# Patient Record
Sex: Female | Born: 1945 | Race: White | Hispanic: No | Marital: Married | Smoking: Never smoker
Health system: Southern US, Community
[De-identification: ages and names within clinical notes are randomized; demographics above are authoritative.]

## PROBLEM LIST (undated history)

## (undated) DIAGNOSIS — I10 Essential (primary) hypertension: Secondary | ICD-10-CM

## (undated) DIAGNOSIS — L719 Rosacea, unspecified: Secondary | ICD-10-CM

## (undated) DIAGNOSIS — M8580 Other specified disorders of bone density and structure, unspecified site: Secondary | ICD-10-CM

## (undated) DIAGNOSIS — I73 Raynaud'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: Secondary | ICD-10-CM

## (undated) DIAGNOSIS — F988 Other specified behavioral and emotional disorders with onset usually occurring in childhood and adolescence: Secondary | ICD-10-CM

## (undated) DIAGNOSIS — E559 Vitamin D deficiency, unspecified: Secondary | ICD-10-CM

## (undated) DIAGNOSIS — I8390 Asymptomatic varicose veins of unspecified lower extremity: Secondary | ICD-10-CM

## (undated) DIAGNOSIS — M5134 Other intervertebral disc degeneration, thoracic region: Principal | ICD-10-CM

## (undated) DIAGNOSIS — M19042 Primary osteoarthritis, left hand: Secondary | ICD-10-CM

## (undated) DIAGNOSIS — M19041 Primary osteoarthritis, right hand: Secondary | ICD-10-CM

## (undated) DIAGNOSIS — M40209 Unspecified kyphosis, site unspecified: Secondary | ICD-10-CM

## (undated) HISTORY — DX: Asymptomatic varicose veins of unspecified lower extremity: I83.90

## (undated) HISTORY — DX: Unspecified kyphosis, site unspecified: M40.209

## (undated) HISTORY — DX: Primary osteoarthritis, left hand: M19.042

## (undated) HISTORY — DX: Raynaud'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: I73.00

## (undated) HISTORY — DX: Rosacea, unspecified: L71.9

## (undated) HISTORY — PX: ABDOMINAL HYSTERECTOMY: SHX81

## (undated) HISTORY — PX: CHOLECYSTECTOMY: SHX55

## (undated) HISTORY — DX: Other intervertebral disc degeneration, thoracic region: M51.34

## (undated) HISTORY — PX: TONSILLECTOMY: SUR1361

## (undated) HISTORY — DX: Other specified behavioral and emotional disorders with onset usually occurring in childhood and adolescence: F98.8

## (undated) HISTORY — PX: OTHER SURGICAL HISTORY: SHX169

## (undated) HISTORY — DX: Vitamin D deficiency, unspecified: E55.9

## (undated) HISTORY — DX: Other specified disorders of bone density and structure, unspecified site: M85.80

## (undated) HISTORY — DX: Primary osteoarthritis, right hand: M19.041

---

## 2000-10-11 ENCOUNTER — Ambulatory Visit (HOSPITAL_COMMUNITY): Admission: RE | Admit: 2000-10-11 | Discharge: 2000-10-11 | Payer: Self-pay | Admitting: Internal Medicine

## 2000-10-11 ENCOUNTER — Encounter: Payer: Self-pay | Admitting: Internal Medicine

## 2002-09-23 ENCOUNTER — Encounter: Payer: Self-pay | Admitting: Orthopaedic Surgery

## 2002-09-23 ENCOUNTER — Ambulatory Visit (HOSPITAL_COMMUNITY): Admission: RE | Admit: 2002-09-23 | Discharge: 2002-09-23 | Payer: Self-pay | Admitting: Orthopaedic Surgery

## 2002-10-15 ENCOUNTER — Encounter (HOSPITAL_COMMUNITY): Admission: RE | Admit: 2002-10-15 | Discharge: 2002-11-14 | Payer: Self-pay | Admitting: Orthopaedic Surgery

## 2005-10-04 ENCOUNTER — Ambulatory Visit: Payer: Self-pay | Admitting: Orthopedic Surgery

## 2005-10-29 ENCOUNTER — Ambulatory Visit: Payer: Self-pay | Admitting: Orthopedic Surgery

## 2006-11-04 ENCOUNTER — Ambulatory Visit (HOSPITAL_COMMUNITY): Admission: RE | Admit: 2006-11-04 | Discharge: 2006-11-04 | Payer: Self-pay | Admitting: Obstetrics and Gynecology

## 2006-11-13 ENCOUNTER — Ambulatory Visit (HOSPITAL_COMMUNITY): Admission: RE | Admit: 2006-11-13 | Discharge: 2006-11-13 | Payer: Self-pay | Admitting: Obstetrics and Gynecology

## 2007-05-19 ENCOUNTER — Ambulatory Visit (HOSPITAL_COMMUNITY): Admission: RE | Admit: 2007-05-19 | Discharge: 2007-05-19 | Payer: Self-pay | Admitting: Obstetrics and Gynecology

## 2007-11-06 ENCOUNTER — Ambulatory Visit (HOSPITAL_COMMUNITY): Admission: RE | Admit: 2007-11-06 | Discharge: 2007-11-06 | Payer: Self-pay | Admitting: Obstetrics and Gynecology

## 2007-11-17 ENCOUNTER — Ambulatory Visit (HOSPITAL_COMMUNITY): Admission: RE | Admit: 2007-11-17 | Discharge: 2007-11-17 | Payer: Self-pay | Admitting: Obstetrics and Gynecology

## 2009-01-20 ENCOUNTER — Ambulatory Visit (HOSPITAL_BASED_OUTPATIENT_CLINIC_OR_DEPARTMENT_OTHER): Admission: RE | Admit: 2009-01-20 | Discharge: 2009-01-20 | Payer: Self-pay | Admitting: Orthopedic Surgery

## 2009-02-03 ENCOUNTER — Ambulatory Visit (HOSPITAL_COMMUNITY): Admission: RE | Admit: 2009-02-03 | Discharge: 2009-02-03 | Payer: Self-pay | Admitting: Obstetrics and Gynecology

## 2011-05-02 ENCOUNTER — Other Ambulatory Visit: Payer: Self-pay

## 2011-05-02 DIAGNOSIS — I83893 Varicose veins of bilateral lower extremities with other complications: Secondary | ICD-10-CM

## 2011-05-30 ENCOUNTER — Encounter: Payer: Self-pay | Admitting: Vascular Surgery

## 2011-06-07 ENCOUNTER — Encounter: Payer: Self-pay | Admitting: Vascular Surgery

## 2013-09-09 ENCOUNTER — Ambulatory Visit (HOSPITAL_COMMUNITY): Payer: Self-pay | Admitting: Physical Therapy

## 2013-09-17 ENCOUNTER — Ambulatory Visit (HOSPITAL_COMMUNITY)
Admission: RE | Admit: 2013-09-17 | Discharge: 2013-09-17 | Disposition: A | Discharge status: Home / Self Care | Payer: Medicare Other | Source: Ambulatory Visit | Attending: Rheumatology | Admitting: Rheumatology

## 2013-09-17 DIAGNOSIS — IMO0001 Reserved for inherently not codable concepts without codable children: Secondary | ICD-10-CM | POA: Insufficient documentation

## 2013-09-17 DIAGNOSIS — M6281 Muscle weakness (generalized): Secondary | ICD-10-CM | POA: Diagnosis not present

## 2013-09-17 DIAGNOSIS — M25659 Stiffness of unspecified hip, not elsewhere classified: Secondary | ICD-10-CM

## 2013-09-17 DIAGNOSIS — M818 Other osteoporosis without current pathological fracture: Secondary | ICD-10-CM

## 2013-09-17 DIAGNOSIS — R269 Unspecified abnormalities of gait and mobility: Secondary | ICD-10-CM | POA: Diagnosis not present

## 2013-09-17 DIAGNOSIS — Z13828 Encounter for screening for other musculoskeletal disorder: Secondary | ICD-10-CM

## 2013-09-17 DIAGNOSIS — R262 Difficulty in walking, not elsewhere classified: Secondary | ICD-10-CM

## 2013-09-17 NOTE — Evaluation (Addendum)
Physical Therapy Evaluation  Patient Details  Name: Lauren Knox MRN: [id number] Date of Birth: 03/29/1945  Today's Date: 09/17/2013 Time: 8299-3716 PT Time Calculation (min): 46 min     Charges: 1 Evaluation, TherEx [id number]         Visit#: 1 of 4  Re-eval: 10/17/13 Assessment Diagnosis: Osteoporosis Next MD Visit: Bo Merino, October Prior Therapy: no  Authorization: [employer]     Past Medical History:  Past Medical History  Diagnosis Date  . Varicose veins   . Raynaud's disease   . Osteopenia    Past Surgical History:  Past Surgical History  Procedure Laterality Date  . Abdominal hysterectomy      Subjective Symptoms/Limitations Symptoms: history of pain on Rt side but no recent pain.  Pertinent History: Patient recently had a bone density scan that indicated osteoporosis. bone density scan indicated decreased bone density on Rt vs Lt.  Patient Stated Goals: To increase bone density Pain Assessment Currently in Pain?: No/denies  Cognition/Observation Observation/Other Assessments Observations: Gait: limited hip internal/external rotation, excessive valgus moment, trendelenbereg gait bilaterally..  Other Assessments: Posture: slight forward head, excessive kyphosis, Scoliosis Rt side bent , Lt rotated   Assessment RLE Strength Right Hip Flexion: 5/5 Right Hip Extension: 4/5 Right Hip ABduction: 3+/5 Right Knee Flexion: 4/5 Right Knee Extension: 5/5 Right Ankle Dorsiflexion: 4/5 LLE Strength Left Hip Flexion: 5/5 Left Hip Extension: 4/5 Left Hip ABduction: 3+/5 Left Knee Flexion: 4/5 Left Knee Extension: 5/5 Left Ankle Dorsiflexion: 4/5 Lumbar AROM Overall Lumbar AROM Comments: Scoliosis Rt sidebent Lt rotated Lumbar Strength Lumbar Flexion: 4/5 Lumbar Extension: 4/5  Exercise/Treatments Stretches Piriformis Stretch: Limitations Piriformis Stretch Limitations: Seated 10x 3" Standing Functional Squats: Limitations Functional Squats  Limitations: Squat matrix 5x each (35reps) 3D hip excursions 10x  Physical Therapy Assessment and Plan PT Assessment and Plan Clinical Impression Statement: Patient arrives with referral from doctor for osteoporsis. Patient displasy bilateral LE weakness in gluts and hamstrings, as well as weak abdominal muscles resultign in abnormal posturing of forward head, increased thoracic kyphosis and scoliosis of Rt sidebent and Lt rotated. Patient will benefit from skilled phsyical therapy to educate patient on a strengtheing and stretching program to increase her strength and posture and decrease patient risk of osteoporosis related fractures. Patient was educated this session on performing a regular walking programs and utilizign good dietary habits. Patient stated understanding and that she will be contineuing a regular walking program and continue going to the gym to perform strength training she is educated on in therapy. Patient dmeosntrated good performance and understanding of piriformis stretch and squat matrix noting no pain and the exercises "felt good." Pt will benefit from skilled therapeutic intervention in order to improve on the following deficits: Abnormal gait;Decreased activity tolerance;Decreased balance;Decreased strength;Pain;Impaired flexibility;Improper spinal/pelvic alignment;Improper body mechanics;Increased fascial restricitons Rehab Potential: Good PT Frequency: Min 2X/week PT Duration: 4 weeks PT Treatment/Interventions: Gait training;Stair training;Functional mobility training;Therapeutic activities;Therapeutic exercise;Balance training;Neuromuscular re-education;Patient/family education;Manual techniques PT Plan: Focus of therapy to primarily be on education. Next session introduce LE stretches includign: hip flexor, hamstring, gastroc, and groin all in standing and discharge all stretched to HEP. also initiate UE ground matrix and over head dumbbell matrix. The follwoing session  intorduce lunge matrix and 3D step ups    Goals Home Exercise Program Pt/caregiver will Perform Home Exercise Program: For increased ROM;For increased strengthening PT Goal: Perform Home Exercise Program - Progress: Goal set today PT Short Term Goals Time to Complete Short Term Goals: 2 weeks PT Short  Term Goal 1: Patient will be independent with all stretching exercises for HEP PT Short Term Goal 2: Patient will demonstrate a negative piriformis test indicating improved hip mobility PT Short Term Goal 3: Patient will be independent in exercises to promote decreased scoliotic posture PT Long Term Goals Time to Complete Long Term Goals: 4 weeks PT Long Term Goal 1: Patient will be independent with all strenghtening and stretching exercises for HEP PT Long Term Goal 2: Patient will dmeosntrate hip extension and abduction strength of 4+/5 indicatign improved hip stability Long Term Goal 3: Patient will be able to single leg stand >10 seconds on each leg Long Term Goal 4: Patient will be able to demosntrate 4+/5 abdominal muscle strength and equal timing of muscle contraction with straight leg raise indicating improved trunk stability PT Long Term Goal 5: patienmt will report comfortably walking >105minutes more than 3x a week  Problem List Patient Active Problem List   Diagnosis Date Noted  . 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 Muscle weakness (generalized) 09/17/2013  . Scoliosis concern 09/17/2013  . Stiffness of joint, not elsewhere classified, pelvic region and thigh 09/17/2013  . Difficulty in walking(719.7) 09/17/2013    PT - End of Session Activity Tolerance: Patient tolerated treatment well General Behavior During Therapy: WFL for tasks assessed/performed PT Plan of Care PT Home Exercise Plan: piriformis stretch and squat matrix  GP Functional Assessment Tool Used: FOTO 17% limited Functional Limitation: Mobility: Walking and moving around Mobility: Walking and Moving  Around Current Status (K5993): At least 1 percent but less than 20 percent impaired, limited or restricted Mobility: Walking and Moving Around Goal Status [phone]): 0 percent impaired, limited or restricted Mobility: Walking and Moving Around Discharge Status [phone]): 0 percent impaired, limited or restricted  Shenaya Lebo R 09/17/2013, 10:00 AM  Physician Documentation Your signature is required to indicate approval of the treatment plan as stated above.  Please sign and either send electronically or make a copy of this report for your files and return this physician signed original.   Please mark one 1.__approve of plan  2. ___approve of plan with the following conditions.   ______________________________                                                          _____________________ Physician Signature                                                                                                             Date

## 2013-09-25 ENCOUNTER — Ambulatory Visit (HOSPITAL_COMMUNITY)
Admission: RE | Admit: 2013-09-25 | Discharge: 2013-09-25 | Disposition: A | Discharge status: Home / Self Care | Payer: Medicare Other | Source: Ambulatory Visit | Attending: Internal Medicine | Admitting: Internal Medicine

## 2013-09-25 DIAGNOSIS — IMO0001 Reserved for inherently not codable concepts without codable children: Secondary | ICD-10-CM | POA: Diagnosis not present

## 2013-09-25 NOTE — Progress Notes (Signed)
Physical Therapy Treatment Patient Details  Name: Lauren Knox MRN: [id number] Date of Birth: [birth year]  Today's Date: 09/25/2013 Time: 3646-8032 PT Time Calculation (min): 41 min   Charges: TherEx 1224-8250 Visit#:  2 of   4 Re-eval:   Assessment Diagnosis: Osteoporosis Next MD Visit: Bo Merino, October Prior Therapy: no  Authorization:    Authorization Time Period:    Authorization Visit#:   of     Subjective: Symptoms/Limitations Symptoms: Patient notes sorenessfollwoing performance of HEP specifically with squatting.  Pain Assessment Currently in Pain?: No/denies  Exercise/Treatments Stretches Quad Stretch: Limitations Sports administrator Limitations: 14" retro half kneeling anterior hip drive (rectus femoris stretch) 10x Piriformis Stretch: Limitations Piriformis Stretch Limitations: Seated 10x 3" Standing Functional Squats: Limitations Functional Squats Limitations: Squat matrix 5x each (35reps) with 5lb dumbbell Forward Lunge: 10 reps Side Lunge: 10 reps Other Standing Lumbar Exercises: overhead head dumbell matrix with 3lb dumbbell Other Standing Lumbar Exercises: step ups  Seated Other Seated Lumbar Exercises: 3D thoracic spine excursion  Physical Therapy Assessment and Plan PT Assessment and Plan Clinical Impression Statement: Patients anterior knee pain/soreness attribuuted to limited rectus femoris mobility with pain relived immediately following rectus femoris stretch. Patiend diplayed significant difficulty with performance if lunges attributed to luimited gult strength and difficulty following multi step directions despite verbal, visual and tactile cuing. Patient demonstrated good understand of all exercises at end of session with all exercises discharged to HEP at end of session.  PT Plan: Focus of therapy to primarily be on education. Next session introduce UE ground matrix, tateal and transverse plane step ups,     Goals PT Short Term Goals PT  Short Term Goal 1: Patient will be independent with all stretching exercises for HEP PT Short Term Goal 1 - Progress: Progressing toward goal PT Short Term Goal 2: Patient will demonstrate a negative piriformis test indicating improved hip mobility PT Short Term Goal 2 - Progress: Progressing toward goal PT Short Term Goal 3: Patient will be independent in exercises to promote decreased scoliotic posture PT Short Term Goal 3 - Progress: Progressing toward goal PT Long Term Goals PT Long Term Goal 1: Patient will be independent with all strenghtening and stretching exercises for HEP PT Long Term Goal 1 - Progress: Progressing toward goal PT Long Term Goal 2: Patient will dmeosntrate hip extension and abduction strength of 4+/5 indicatign improved hip stability PT Long Term Goal 2 - Progress: Progressing toward goal Long Term Goal 3: Patient will be able to single leg stand >10 seconds on each leg Long Term Goal 3 Progress: Progressing toward goal Long Term Goal 4: Patient will be able to demosntrate 4+/5 abdominal muscle strength and equal timing of muscle contraction with straight leg raise indicating improved trunk stability Long Term Goal 4 Progress: Progressing toward goal PT Long Term Goal 5: patienmt will report comfortably walking >73minutes more than 3x a week Long Term Goal 5 Progress: Progressing toward goal  Problem List Patient Active Problem List   Diagnosis Date Noted  . 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 Muscle weakness (generalized) 09/17/2013  . Scoliosis concern 09/17/2013  . Stiffness of joint, not elsewhere classified, pelvic region and thigh 09/17/2013  . Difficulty in walking(719.7) 09/17/2013       GP    Cleaven Demario R 09/25/2013, 1:00 PM

## 2013-10-01 ENCOUNTER — Ambulatory Visit (HOSPITAL_COMMUNITY)
Admission: RE | Admit: 2013-10-01 | Discharge: 2013-10-01 | Disposition: A | Discharge status: Home / Self Care | Payer: Medicare Other | Source: Ambulatory Visit | Attending: Internal Medicine | Admitting: Internal Medicine

## 2013-10-01 DIAGNOSIS — IMO0001 Reserved for inherently not codable concepts without codable children: Secondary | ICD-10-CM | POA: Diagnosis not present

## 2013-10-01 NOTE — Progress Notes (Signed)
Physical Therapy Treatment Patient Details  Name: Lauren Knox MRN: [id number] Date of Birth: 1945/07/16  Today's Date: 10/01/2013 Time: 1308-6578 PT Time Calculation (min): 45 min Charge: TE 4696-2952  Visit#: 3 of 5  Re-eval: 10/17/13    Authorization: [employer]  Authorization Time Period:    Authorization Visit#:   of     Subjective: Symptoms/Limitations Symptoms: Pt stated soreness/stiffness following exercises Pain Assessment Currently in Pain?: Yes Pain Score: 3  Pain Location: Knee Pain Orientation: Right;Left  Objective:   Exercise/Treatments Stretches Active Hamstring Stretch: 3 reps;30 seconds;Limitations Active Hamstring Stretch Limitations: 14in box 3 directions Quad Stretch: Limitations Quad Stretch Limitations: 14" retro half kneeling anterior hip drive (rectus femoris stretch) 10x Piriformis Stretch: Limitations Piriformis Stretch Limitations: Seated 10x 3" Standing Other Standing Lumbar Exercises: overhead head dumbell matrix with 3lb dumbbell Other Standing Lumbar Exercises: step ups 6in Bil LE 15x; transverse step up on 8in box 10x Seated Other Seated Lumbar Exercises: 3D thoracic spine excursion Prone  Other Prone Lumbar Exercises: UE ground matrix 10x each UE each direciton    Physical Therapy Assessment and Plan PT Assessment and Plan Clinical Impression Statement: Added lateral and transverse step up for gluteal strengthening with therapist facilitation to improve form.  Began UE ground matrix for core strengthening and to improve thoracic ROM to improve posture.  No reports of pain through session.   PT Plan: Focus of therapy to primarily be on education. Next session address balance activties to improve confidence with functional activities.      Goals PT Short Term Goals PT Short Term Goal 1: Patient will be independent with all stretching exercises for HEP PT Short Term Goal 2: Patient will demonstrate a negative piriformis test indicating  improved hip mobility PT Short Term Goal 3: Patient will be independent in exercises to promote decreased scoliotic posture PT Long Term Goals PT Long Term Goal 1: Patient will be independent with all strenghtening and stretching exercises for HEP PT Long Term Goal 2: Patient will dmeosntrate hip extension and abduction strength of 4+/5 indicatign improved hip stability Long Term Goal 3: Patient will be able to single leg stand >10 seconds on each leg Long Term Goal 4: Patient will be able to demosntrate 4+/5 abdominal muscle strength and equal timing of muscle contraction with straight leg raise indicating improved trunk stability PT Long Term Goal 5: patienmt will report comfortably walking >63minutes more than 3x a week  Problem List Patient Active Problem List   Diagnosis Date Noted  . 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 Muscle weakness (generalized) 09/17/2013  . Scoliosis concern 09/17/2013  . Stiffness of joint, not elsewhere classified, pelvic region and thigh 09/17/2013  . Difficulty in walking(719.7) 09/17/2013    PT - End of Session Activity Tolerance: Patient tolerated treatment well General Behavior During Therapy: [facility] for tasks assessed/performed  GP    Aldona Lento 10/01/2013, 1:02 PM

## 2013-10-08 ENCOUNTER — Ambulatory Visit (HOSPITAL_COMMUNITY)
Admission: RE | Admit: 2013-10-08 | Discharge: 2013-10-08 | Disposition: A | Discharge status: Home / Self Care | Payer: Medicare Other | Source: Ambulatory Visit | Attending: Rheumatology | Admitting: Rheumatology

## 2013-10-08 DIAGNOSIS — IMO0001 Reserved for inherently not codable concepts without codable children: Secondary | ICD-10-CM | POA: Diagnosis not present

## 2013-10-08 NOTE — Progress Notes (Signed)
Physical Therapy Treatment Patient Details  Name: Lauren Knox MRN: [id number] Date of Birth: [birth year]  Today's Date: 10/08/2013 Time: 5027-7412 PT Time Calculation (min): 43 min Visit#: 4 of 5  Re-eval: 10/17/13 Authorization: [employer]  Charges:  therex 42  Subjective: Pt states she has questions regarding some of her HEP (if back knee should be bent or straight with lunges).  States she's been doing her HEP but unsure if she's doing them right.  Currently without pain.     Exercise/Treatments Stretches Active Hamstring Stretch: 3 reps;30 seconds;Limitations Active Hamstring Stretch Limitations: 14in box 3 directions Piriformis Stretch: Limitations Piriformis Stretch Limitations: Seated 10x 3" Standing Functional Squats: Limitations Functional Squats Limitations: Squat matrix 5x each (35reps) with 5lb dumbbell Forward Lunge: 5 reps Side Lunge: 5 reps Push / Pull Sled: single leg matrix with 2" step 10 reps each with 1 HHA Other Standing Lumbar Exercises: overhead head dumbell matrix with 3lb dumbbel(fwd, back close to ears, side, overhead) Other Standing Lumbar Exercises: step ups 6in Bil LE 10X transverse step up without knee hike no UE assist  Physical Therapy Assessment and Plan PT Assessment and Plan Clinical Impression Statement: Pt continues to have difficulty with recall and form of established HEP.  Reviewed all therex with most difficulty coordinating UE/LE movements and keeping body in alignment while completing.  Lead leg with IR of knee with lunges.  Unable to complete transverse step up with knee hike due to instability.  Instructed to do SLS at sink at home; Added single leg matrix using 2" step to increase LE stablity.  No reports of pai at end of session. PT Plan: Focus of therapy to primarily be on education.  continue review of established exercises and progress balance.  Re-evaluate next visit.       Problem List Patient Active Problem List   Diagnosis  Date Noted  . 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 Muscle weakness (generalized) 09/17/2013  . Scoliosis concern 09/17/2013  . Stiffness of joint, not elsewhere classified, pelvic region and thigh 09/17/2013  . Difficulty in walking(719.7) 09/17/2013    PT - End of Session Activity Tolerance: Patient tolerated treatment well General Behavior During Therapy: WFL for tasks assessed/performed   Teena Irani, PTA/CLT 10/08/2013, 3:40 PM

## 2013-10-15 ENCOUNTER — Ambulatory Visit (HOSPITAL_COMMUNITY)
Admission: RE | Admit: 2013-10-15 | Discharge: 2013-10-15 | Disposition: A | Discharge status: Home / Self Care | Payer: Medicare Other | Source: Ambulatory Visit | Attending: Internal Medicine | Admitting: Internal Medicine

## 2013-10-15 DIAGNOSIS — M6281 Muscle weakness (generalized): Secondary | ICD-10-CM | POA: Diagnosis not present

## 2013-10-15 DIAGNOSIS — Z5189 Encounter for other specified aftercare: Secondary | ICD-10-CM | POA: Diagnosis not present

## 2013-10-15 DIAGNOSIS — R269 Unspecified abnormalities of gait and mobility: Secondary | ICD-10-CM | POA: Insufficient documentation

## 2013-10-15 DIAGNOSIS — M810 Age-related osteoporosis without current pathological fracture: Secondary | ICD-10-CM | POA: Diagnosis not present

## 2013-10-15 NOTE — Evaluation (Signed)
Physical Therapy Evaluation  Patient Details  Name: Lauren Knox MRN: [id number] Date of Birth: 20-Jun-1945  Today's Date: 10/15/2013 Time: 3976-7341 PT Time Calculation (min): 45 min     Charges: TE 845-930         Visit#: 5 of 5  Re-eval: 10/17/13 Assessment Diagnosis: Osteoporosis Next MD Visit: Bo Merino, October 6th Prior Therapy: no  Authorization: [employer]    Authorization Time Period:    Authorization Visit#:   of     Past Medical History:  Past Medical History  Diagnosis Date  . Varicose veins   . Raynaud's disease   . Osteopenia    Past Surgical History:  Past Surgical History  Procedure Laterality Date  . Abdominal hysterectomy      Subjective Symptoms/Limitations Symptoms: Patient states she feels her balance is still off a little, but is feeling better.  Pain Assessment Currently in Pain?: No/denies  Cognition/Observation Observation/Other Assessments Observations: Gait WNL Other Assessments: Posture: Minor fiorward head posture  Assessment RLE Strength Right Hip Flexion: 5/5 Right Hip Extension: 4/5 Right Hip ABduction: 4/5 Right Knee Flexion: 4/5 Right Knee Extension: 5/5 Right Ankle Dorsiflexion: 5/5 LLE Strength Left Hip Flexion: 5/5 Left Hip Extension: 4/5 Left Hip ABduction: 4/5 Left Knee Flexion: 4/5 Left Knee Extension: 5/5 Left Ankle Dorsiflexion: 5/5 Lumbar AROM Overall Lumbar AROM Comments: Scoliosis Rt sidebent Lt rotated Lumbar Strength Lumbar Flexion: 5/5 Lumbar Extension: 5/5  Exercise/Treatments Standing Functional Squats: Limitations Functional Squats Limitations: Squat reach matrix 5x each (35reps) with 5lb dumbbell Forward Lunge: Limitations Forward Lunge Limitations: lunge matrix common 5x each Other Standing Lumbar Exercises: 2" Single leg balance reach matrix 5x, Below shoulder height UE dumbbell matrix 3lb 5x, overhead head dumbell matrix with 5lb dumbbel(fwd, back close to ears, side, overhead) 5x  each Other Standing Lumbar Exercises: 8" 3D step up 10x Prone  Other Prone Lumbar Exercises: UE and LE ground matrix 5x each  Physical Therapy Assessment and Plan PT Assessment and Plan Clinical Impression Statement: Patient has met most goals and is expected to progress independently to reach remainign goals withtou difficulty as long as she performs HEP as instructed. Patient displays improved trunk alignment, improved gait mechics and improved balance. patient has no pain and is independent with HEP.  This session focused on further education of exercises to be performed as part of HEP and and progressiong of exercises to increase intensity and further icnrease strength independently.  PT Plan: Patient Discharged with HEP.    Goals PT Short Term Goals PT Short Term Goal 1: Patient will be independent with all stretching exercises for HEP PT Short Term Goal 1 - Progress: Met PT Short Term Goal 2: Patient will demonstrate a negative piriformis test indicating improved hip mobility PT Short Term Goal 2 - Progress: Met PT Short Term Goal 3: Patient will be independent in exercises to promote decreased scoliotic posture PT Short Term Goal 3 - Progress: Met PT Long Term Goals PT Long Term Goal 1: Patient will be independent with all strenghtening and stretching exercises for HEP PT Long Term Goal 1 - Progress: Met PT Long Term Goal 2: Patient will dmeosntrate hip extension and abduction strength of 4+/5 indicatign improved hip stability PT Long Term Goal 2 - Progress: Progressing toward goal Long Term Goal 3: Patient will be able to single leg stand >10 seconds on each leg Long Term Goal 3 Progress: Met Long Term Goal 4: Patient will be able to demosntrate 4+/5 abdominal muscle strength and equal timing  of muscle contraction with straight leg raise indicating improved trunk stability Long Term Goal 4 Progress: Met PT Long Term Goal 5: patienmt will report comfortably walking >30mnutes more  than 3x a week Long Term Goal 5 Progress: Met  Problem List Patient Active Problem List   Diagnosis Date Noted  . 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 Muscle weakness (generalized) 09/17/2013  . Scoliosis concern 09/17/2013  . Stiffness of joint, not elsewhere classified, pelvic region and thigh 09/17/2013  . Difficulty in walking(719.7) 09/17/2013    PT - End of Session Activity Tolerance: Patient tolerated treatment well General Behavior During Therapy: WFL for tasks assessed/performed PT Plan of Care PT Home Exercise Plan: Given.   GP Functional Assessment Tool Used: Clinical judgement 0% limited Functional Limitation: Mobility: Walking and moving around Mobility: Walking and Moving Around Current Status ([phone]: 0 percent impaired, limited or restricted Mobility: Walking and Moving Around Goal Status ([phone]: 0 percent impaired, limited or restricted Mobility: Walking and Moving Around Discharge Status ([phone]: 0 percent impaired, limited or restricted  Annalina Needles R 10/15/2013, 9:54 AM  Physician Documentation Your signature is required to indicate approval of the treatment plan as stated above.  Please sign and either send electronically or make a copy of this report for your files and return this physician signed original.   Please mark one 1.__approve of plan  2. ___approve of plan with the following conditions.   ______________________________                                                          _____________________ Physician Signature                                                                                                             Date

## 2013-10-15 NOTE — Addendum Note (Signed)
Encounter addended by: Leia Alf, PT on: 10/15/2013 10:02 AM<BR>     Documentation filed: Clinical Notes

## 2015-04-01 DIAGNOSIS — Z79899 Other long term (current) drug therapy: Secondary | ICD-10-CM | POA: Diagnosis not present

## 2015-04-01 DIAGNOSIS — D580 Hereditary spherocytosis: Secondary | ICD-10-CM | POA: Diagnosis not present

## 2015-04-01 DIAGNOSIS — E119 Type 2 diabetes mellitus without complications: Secondary | ICD-10-CM | POA: Diagnosis not present

## 2015-04-01 DIAGNOSIS — M810 Age-related osteoporosis without current pathological fracture: Secondary | ICD-10-CM | POA: Diagnosis not present

## 2015-04-12 DIAGNOSIS — D580 Hereditary spherocytosis: Secondary | ICD-10-CM | POA: Diagnosis not present

## 2015-04-12 DIAGNOSIS — Z6824 Body mass index (BMI) 24.0-24.9, adult: Secondary | ICD-10-CM | POA: Diagnosis not present

## 2015-04-12 DIAGNOSIS — E1129 Type 2 diabetes mellitus with other diabetic kidney complication: Secondary | ICD-10-CM | POA: Diagnosis not present

## 2015-04-12 DIAGNOSIS — E559 Vitamin D deficiency, unspecified: Secondary | ICD-10-CM | POA: Diagnosis not present

## 2015-05-11 DIAGNOSIS — Z6823 Body mass index (BMI) 23.0-23.9, adult: Secondary | ICD-10-CM | POA: Diagnosis not present

## 2015-05-11 DIAGNOSIS — Z01419 Encounter for gynecological examination (general) (routine) without abnormal findings: Secondary | ICD-10-CM | POA: Diagnosis not present

## 2015-05-11 DIAGNOSIS — M816 Localized osteoporosis [Lequesne]: Secondary | ICD-10-CM | POA: Diagnosis not present

## 2015-05-11 DIAGNOSIS — Z1231 Encounter for screening mammogram for malignant neoplasm of breast: Secondary | ICD-10-CM | POA: Diagnosis not present

## 2015-06-03 DIAGNOSIS — E119 Type 2 diabetes mellitus without complications: Secondary | ICD-10-CM | POA: Diagnosis not present

## 2015-06-03 DIAGNOSIS — H2513 Age-related nuclear cataract, bilateral: Secondary | ICD-10-CM | POA: Diagnosis not present

## 2015-06-03 DIAGNOSIS — H40013 Open angle with borderline findings, low risk, bilateral: Secondary | ICD-10-CM | POA: Diagnosis not present

## 2015-06-03 DIAGNOSIS — H10413 Chronic giant papillary conjunctivitis, bilateral: Secondary | ICD-10-CM | POA: Diagnosis not present

## 2015-06-21 DIAGNOSIS — M19241 Secondary osteoarthritis, right hand: Secondary | ICD-10-CM | POA: Diagnosis not present

## 2015-06-21 DIAGNOSIS — M810 Age-related osteoporosis without current pathological fracture: Secondary | ICD-10-CM | POA: Diagnosis not present

## 2015-06-21 DIAGNOSIS — M791 Myalgia: Secondary | ICD-10-CM | POA: Diagnosis not present

## 2015-06-21 LAB — BASIC METABOLIC PANEL
Potassium: 4.1 mmol/L (ref 3.4–5.3)
Sodium: 141 mmol/L (ref 137–147)

## 2015-06-24 DIAGNOSIS — E559 Vitamin D deficiency, unspecified: Secondary | ICD-10-CM | POA: Diagnosis not present

## 2015-06-24 DIAGNOSIS — R5381 Other malaise: Secondary | ICD-10-CM | POA: Diagnosis not present

## 2015-06-25 LAB — VITAMIN D 25 HYDROXY (VIT D DEFICIENCY, FRACTURES): VIT D 25 HYDROXY: 33

## 2015-10-25 DIAGNOSIS — Z1211 Encounter for screening for malignant neoplasm of colon: Secondary | ICD-10-CM | POA: Diagnosis not present

## 2015-10-25 DIAGNOSIS — Z800 Family history of malignant neoplasm of digestive organs: Secondary | ICD-10-CM | POA: Diagnosis not present

## 2015-10-25 DIAGNOSIS — Z8371 Family history of colonic polyps: Secondary | ICD-10-CM | POA: Diagnosis not present

## 2015-10-25 DIAGNOSIS — K5730 Diverticulosis of large intestine without perforation or abscess without bleeding: Secondary | ICD-10-CM | POA: Diagnosis not present

## 2015-10-25 DIAGNOSIS — Z86010 Personal history of colonic polyps: Secondary | ICD-10-CM | POA: Diagnosis not present

## 2015-11-21 DIAGNOSIS — D225 Melanocytic nevi of trunk: Secondary | ICD-10-CM | POA: Diagnosis not present

## 2015-11-21 DIAGNOSIS — L700 Acne vulgaris: Secondary | ICD-10-CM | POA: Diagnosis not present

## 2015-11-21 DIAGNOSIS — L570 Actinic keratosis: Secondary | ICD-10-CM | POA: Diagnosis not present

## 2015-11-21 DIAGNOSIS — Z23 Encounter for immunization: Secondary | ICD-10-CM | POA: Diagnosis not present

## 2015-11-21 DIAGNOSIS — L719 Rosacea, unspecified: Secondary | ICD-10-CM | POA: Diagnosis not present

## 2015-11-21 DIAGNOSIS — Z85828 Personal history of other malignant neoplasm of skin: Secondary | ICD-10-CM | POA: Diagnosis not present

## 2015-11-21 DIAGNOSIS — D1801 Hemangioma of skin and subcutaneous tissue: Secondary | ICD-10-CM | POA: Diagnosis not present

## 2015-11-21 DIAGNOSIS — L814 Other melanin hyperpigmentation: Secondary | ICD-10-CM | POA: Diagnosis not present

## 2015-11-21 DIAGNOSIS — L821 Other seborrheic keratosis: Secondary | ICD-10-CM | POA: Diagnosis not present

## 2015-11-22 DIAGNOSIS — Z23 Encounter for immunization: Secondary | ICD-10-CM | POA: Diagnosis not present

## 2015-12-20 ENCOUNTER — Ambulatory Visit: Payer: Self-pay | Admitting: Rheumatology

## 2015-12-21 DIAGNOSIS — R69 Illness, unspecified: Secondary | ICD-10-CM | POA: Diagnosis not present

## 2015-12-21 DIAGNOSIS — F341 Dysthymic disorder: Secondary | ICD-10-CM | POA: Diagnosis not present

## 2016-01-17 ENCOUNTER — Ambulatory Visit: Payer: Self-pay | Admitting: Rheumatology

## 2016-01-20 DIAGNOSIS — Z1211 Encounter for screening for malignant neoplasm of colon: Secondary | ICD-10-CM | POA: Diagnosis not present

## 2016-01-20 DIAGNOSIS — K5730 Diverticulosis of large intestine without perforation or abscess without bleeding: Secondary | ICD-10-CM | POA: Diagnosis not present

## 2016-01-20 DIAGNOSIS — Z800 Family history of malignant neoplasm of digestive organs: Secondary | ICD-10-CM | POA: Diagnosis not present

## 2016-01-20 DIAGNOSIS — Z86010 Personal history of colonic polyps: Secondary | ICD-10-CM | POA: Diagnosis not present

## 2016-01-25 ENCOUNTER — Telehealth (INDEPENDENT_AMBULATORY_CARE_PROVIDER_SITE_OTHER): Payer: Self-pay | Admitting: Rheumatology

## 2016-01-25 NOTE — Telephone Encounter (Signed)
Last Visit: 06/21/15 Next Visit: 01/30/16  Patient requesting a prescription for a different muscle relaxer. Patient states her insurance company is requesting she be given something other than Tizanidine.

## 2016-01-25 NOTE — Telephone Encounter (Signed)
Patient requesting Rx muscle relaxer for L shoulder.  She is using a heating pad.  She was written a Rx for Tizanidine but her Insurance Co [employer]) is requesting that she have another brand.    Patient uses [facility]

## 2016-01-25 NOTE — Telephone Encounter (Signed)
It is not appropriate to change the muscle relaxer unless they have medical evidence that Zanaflex is inappropriate for the patient.Without insurance, patient can use good [email or web address] and get the medication for the following price:   $21.45 AT [employer].  For 90 pills [email or web address]

## 2016-01-26 ENCOUNTER — Telehealth (INDEPENDENT_AMBULATORY_CARE_PROVIDER_SITE_OTHER): Payer: Self-pay | Admitting: Rheumatology

## 2016-01-26 MED ORDER — TIZANIDINE HCL 4 MG PO TABS: 4.0000 mg | ORAL_TABLET | Freq: Four times a day (QID) | ORAL | 1 refills | Status: DC | PRN

## 2016-01-26 NOTE — Telephone Encounter (Signed)
Patient has been advised. Patient will use [email or web address] and get th prescription from [facility] in [place]. Patient needs a refill on the Tizanidine.    Last Visit: 06/21/15 Next Visit: 01/30/16  Okay to refill Tizanidine?

## 2016-01-26 NOTE — Telephone Encounter (Signed)
Patient prefers the [facility] in [place] because she does not live in [place]. Good Rx coupon will make it $23.

## 2016-01-26 NOTE — Telephone Encounter (Signed)
Patient says she requested a medication (Tizanidine ? ) this morning. She thought her medication was being sent to [facility] (confirmed per chart) , [facility] says they have not received the request and she wants to know why.  Cb#: [phone]

## 2016-01-26 NOTE — Telephone Encounter (Signed)
Okay to refill Zanaflex 4 mg1 by mouth daily at bedtime.90 pills with 1 refill. Use good Rx coupon at [facility] to get this medicine for less than $22 without insurance

## 2016-01-26 NOTE — Telephone Encounter (Signed)
Left message for patient to advise prescription has been sent to the pharmacy.   

## 2016-01-27 ENCOUNTER — Encounter: Payer: Self-pay | Admitting: Rheumatology

## 2016-01-27 DIAGNOSIS — F988 Other specified behavioral and emotional disorders with onset usually occurring in childhood and adolescence: Secondary | ICD-10-CM | POA: Insufficient documentation

## 2016-01-27 DIAGNOSIS — M40209 Unspecified kyphosis, site unspecified: Secondary | ICD-10-CM

## 2016-01-27 DIAGNOSIS — M5134 Other intervertebral disc degeneration, thoracic region: Secondary | ICD-10-CM

## 2016-01-27 DIAGNOSIS — M19041 Primary osteoarthritis, right hand: Secondary | ICD-10-CM

## 2016-01-27 DIAGNOSIS — I73 Raynaud'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: Secondary | ICD-10-CM

## 2016-01-27 DIAGNOSIS — E559 Vitamin D deficiency, unspecified: Secondary | ICD-10-CM

## 2016-01-27 DIAGNOSIS — L719 Rosacea, unspecified: Secondary | ICD-10-CM

## 2016-01-27 DIAGNOSIS — M19042 Primary osteoarthritis, left hand: Secondary | ICD-10-CM

## 2016-01-27 HISTORY — DX: Other intervertebral disc degeneration, thoracic region: M51.34

## 2016-01-27 HISTORY — DX: Rosacea, unspecified: L71.9

## 2016-01-27 HISTORY — DX: Raynaud'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: I73.00

## 2016-01-27 HISTORY — DX: Primary osteoarthritis, left hand: M19.041

## 2016-01-27 HISTORY — DX: Vitamin D deficiency, unspecified: E55.9

## 2016-01-27 HISTORY — DX: Primary osteoarthritis, right hand: M19.042

## 2016-01-27 HISTORY — DX: Other specified behavioral and emotional disorders with onset usually occurring in childhood and adolescence: F98.8

## 2016-01-27 HISTORY — DX: Unspecified kyphosis, site unspecified: M40.209

## 2016-01-27 NOTE — Progress Notes (Signed)
Office Visit Note  Patient: Lauren Knox             Date of Birth: 05-25-1945           MRN: [id number]             PCP: Asencion Noble, MD Referring: Asencion Noble, MD Visit Date: 01/30/2016 Occupation: @GUAROCC @    Subjective:  Left shoulder pain   History of Present Illness: JEZLYN BEUCLER is a 71 y.o. female with history of osteoporosis, disc disease and osteoarthritis. She states in December 2017 she started having right-sided rib cage pain which resolved by itself. Although the pain was quite severe when it happened. She developed left shoulder joint pain and arm pain every week back after lifting a heavy object and the pain is gradually getting better. She also complains of Raynaud's phenomenon minutes cold-weather. She also wanted to discuss options for treatment of osteoporosis as Fosamax has not helped her.  Activities of Daily Living:  Patient reports morning stiffness for 0 minute.   Patient Denies nocturnal pain.  Difficulty dressing/grooming: Denies Difficulty climbing stairs: Denies Difficulty getting out of chair: Denies Difficulty using hands for taps, buttons, cutlery, and/or writing: Denies   Review of Systems  Constitutional: Negative for fatigue, night sweats, weight gain, weight loss and weakness.  HENT: Negative for mouth sores, trouble swallowing, trouble swallowing, mouth dryness and nose dryness.   Eyes: Negative for pain, redness, visual disturbance and dryness.  Respiratory: Negative for cough, shortness of breath and difficulty breathing.   Cardiovascular: Negative for chest pain, palpitations, hypertension, irregular heartbeat and swelling in legs/feet.  Gastrointestinal: Negative for blood in stool, constipation and diarrhea.  Endocrine: Negative for increased urination.  Genitourinary: Negative for vaginal dryness.  Musculoskeletal: Negative for arthralgias, joint pain, joint swelling, myalgias, muscle weakness, morning stiffness, muscle  tenderness and myalgias.  Skin: Negative for color change, rash, hair loss, skin tightness, ulcers and sensitivity to sunlight.  Allergic/Immunologic: Negative for susceptible to infections.  Neurological: Negative for dizziness, memory loss and night sweats.  Hematological: Negative for swollen glands.  Psychiatric/Behavioral: Negative for depressed mood and sleep disturbance. The patient is not nervous/anxious.     PMFS History:  Patient Active Problem List   Diagnosis Date Noted  . 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 ADD (attention deficit disorder) 01/27/2016  . Rosacea 01/27/2016  . Vitamin D deficiency 01/27/2016  . 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 Muscle weakness (generalized) 09/17/2013  . Scoliosis concern 09/17/2013  . Stiffness of joint, not elsewhere classified, pelvic region and thigh 09/17/2013  . Difficulty in walking(719.7) 09/17/2013    Past Medical History:  Diagnosis Date  . ADD (attention deficit disorder) 01/27/2016  . 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 Osteopenia   . Raynaud's disease   . 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 Rosacea 01/27/2016  . Varicose veins   . Vitamin D deficiency 01/27/2016    Family History  Problem Relation Age of Onset  . Cancer Mother   . Cancer Father   . Heart attack Father    Past Surgical History:  Procedure Laterality Date  . ABDOMINAL HYSTERECTOMY     Social History   Social History Narrative  . No narrative on file     Objective: Vital Signs: BP (!) 173/83 (BP  Location: Left Arm, Patient Position: Sitting, Cuff Size: Large)   Pulse (!) 59   Resp 12   Ht 5' 4.25" (1.632 m)   Wt 148 lb (67.1 kg)   BMI 25.21 kg/m    Physical Exam  Constitutional: She is  oriented to person, place, and time. She appears well-developed and well-nourished.  HENT:  Head: Normocephalic and atraumatic.  Eyes: Conjunctivae and EOM are normal.  Neck: Normal range of motion.  Cardiovascular: Normal rate, regular rhythm, normal heart sounds and intact distal pulses.   Pulmonary/Chest: Effort normal and breath sounds normal.  Abdominal: Soft. Bowel sounds are normal.  Lymphadenopathy:    She has no cervical adenopathy.  Neurological: She is alert and oriented to person, place, and time.  Skin: Skin is warm and dry. Capillary refill takes less than 2 seconds.  Psychiatric: She has a normal mood and affect. Her behavior is normal.  Nursing note and vitals reviewed.    Musculoskeletal Exam: C-spine and thoracic lumbar spine good range of motion. Hip joints elbow joints wrist joint MCPs PIPs were good range of motion she has some thickening of PIP/DIP joints consistent with osteoarthritis. Hip joints knee joints ankles MTPs PIPs with good range of motion with no synovitis. She has some discomfort with range of motion of her left shoulder joint.  CDAI Exam: No CDAI exam completed.    Investigation: No additional findings.   Imaging: No results found.  Speciality Comments: No specialty comments available.    Procedures:  No procedures performed Allergies: Codeine   Assessment / Plan:     Visit Diagnoses: Age-related osteoporosis without current pathological fracture - On Fosamax: Her most recent bone density from 05/11/2015 showed a T score of -2.5 in the femoral neck region which is down by -5.3% from the 2015. We had detailed discussion regarding that. The changes significant. We discussed possible option of using IV Reclast area indications side effects contraindications were discussed at length by me and Dr. Koleen Nimrod of her pharmacist. Patient wants to proceed with IV Reclast. She'll be getting CBC and comprehensive metabolic panel done with her PCP. Begin  schedule IV Reclast after that.  Postural kyphosis of thoracolumbar region: Her kyphosis is improved a lot after physical therapy   Vitamin D deficiency: She has history of vitamin D deficiency she's been on supplements. She'll get her vitamin D levels with her PCP as well.  Acute pain of left shoulder: Recent discomfort in her left shoulder which is gradually improving most likely tendinopathy. I offered physical therapy she declined. She states she has some exercises at home which she will try.  DDD (degenerative disc disease), thoracic: It's been causing chronic pain have advised her to continue to exercise.  Primary osteoarthritis of both hands: Joint protection and muscle strengthening was discussed.   Her other medical problems are as follows for which she sees her PCP and other physicians: Attention deficit disorder, unspecified hyperactivity presence  Rosacea    Orders: No orders of the defined types were placed in this encounter.  No orders of the defined types were placed in this encounter.   Face-to-face time spent with patient was 40 minutes. 50% of time was spent in counseling and coordination of care.  Follow-Up Instructions: Return in about 6 months (around 07/29/2016) for OSTEOPROSIS, fosamax failure, , Osteoporosis.   Bo Merino, MD  Note - This record has been created using Editor, commissioning.  Chart creation errors have been sought, but may not always  have been  located. Such creation errors do not reflect on  the standard of medical care.

## 2016-01-30 ENCOUNTER — Ambulatory Visit (INDEPENDENT_AMBULATORY_CARE_PROVIDER_SITE_OTHER): Payer: Medicare HMO | Admitting: Rheumatology

## 2016-01-30 ENCOUNTER — Encounter: Payer: Self-pay | Admitting: Rheumatology

## 2016-01-30 VITALS — BP 173/83 | HR 59 | Resp 12 | Ht 64.25 in | Wt 148.0 lb

## 2016-01-30 DIAGNOSIS — M810 Age-related osteoporosis without current pathological fracture: Secondary | ICD-10-CM | POA: Diagnosis not present

## 2016-01-30 DIAGNOSIS — M5134 Other intervertebral disc degeneration, thoracic region: Secondary | ICD-10-CM

## 2016-01-30 DIAGNOSIS — M25512 Pain in left shoulder: Secondary | ICD-10-CM | POA: Diagnosis not present

## 2016-01-30 DIAGNOSIS — F988 Other specified behavioral and emotional disorders with onset usually occurring in childhood and adolescence: Secondary | ICD-10-CM

## 2016-01-30 DIAGNOSIS — M19041 Primary osteoarthritis, right hand: Secondary | ICD-10-CM | POA: Diagnosis not present

## 2016-01-30 DIAGNOSIS — M19042 Primary osteoarthritis, left hand: Secondary | ICD-10-CM

## 2016-01-30 DIAGNOSIS — M4005 Postural kyphosis, thoracolumbar region: Secondary | ICD-10-CM

## 2016-01-30 DIAGNOSIS — L719 Rosacea, unspecified: Secondary | ICD-10-CM

## 2016-01-30 DIAGNOSIS — M6281 Muscle weakness (generalized): Secondary | ICD-10-CM | POA: Diagnosis not present

## 2016-01-30 DIAGNOSIS — E559 Vitamin D deficiency, unspecified: Secondary | ICD-10-CM

## 2016-01-30 DIAGNOSIS — R69 Illness, unspecified: Secondary | ICD-10-CM | POA: Diagnosis not present

## 2016-01-30 NOTE — Progress Notes (Signed)
Pharmacy Note  Subjective: Patient presents today to the [facility] to see Dr. Estanislado Pandy.  Patient seen by pharmacist for counseling on bisphosphonate therapy.  Patient is currently on alendronate and is being switched to Reclast infusions.    Objective: T-score: -2.5 (05/11/15) Calcium: 9.4 mg/dL  (04/01/15) Vitamin D: 33 (06/24/15)  Assessment/Plan: Counseled patient that zoledronic acid (Reclast) is an oral bisphosphonate that reduces bone turnover by inhibiting osteoclasts that chew up bone.  Counseled patient on purpose, proper use, and adverse effects of zoledronic acid.  Reviewed with patient that zoledronic acid is a yearly infusion.  Reviewed importance of taking calcium and vitamin D with bisphosphonate therapy.  Patient confirms she is already taking calcium/vitamin D.  Provided patient with medication education material and answered all questions.  Reviewed rare adverse effect of osteonecrosis of the jaw and advised patient to alert her dentist that she is on bisphosphonate therapy prior to any major dental work.  Patient confirms she does not have any major dental work scheduled at this time.  Will apply for Reclast through patient's insurance.  Patient is aware that she will need an updated CMP within 10 days before her infusion.  Patient wanted to get her labs when she has her physical with her primary care provider next month.  Patient was provided with lab order for CMP and vitamin D to take to her primary care office.     Elisabeth Most, Pharm.D., BCPS Clinical Pharmacist Pager: [phone] Phone: [phone] 01/30/2016 2:05 PM

## 2016-01-31 ENCOUNTER — Telehealth: Payer: Self-pay

## 2016-01-31 NOTE — Telephone Encounter (Signed)
Spoke with [name] from [place] to verify that no pre-certification will be required for reclast infusion [id number] for Lauren Knox.  Reference [id number] with Marin Comment from [place] to verify that [name] is covered 100% for her infusion services. She will not have a copay or out of pocket expense.   Reference# [id number]  Demetrios Loll, CPhT

## 2016-01-31 NOTE — Telephone Encounter (Signed)
Called patient and informed her that per the [employer], Reclast is covered by her insurance.  Patient is aware that she must have lab work within 10 days prior to infusion.  Patient still wants to wait and have the lab work done at her annual physican with Dr. Willey Blade in February.  Advised patient to call us if she decides she wants to have labs done prior to that visit.  Patient is aware that labs from Dr. Ria Comment office will need to be faxed to [place] and advised her to call as well to ensure we get the results and have time to put in the order and schedule her Reclast in the 10 day window after labs.  Patient voiced understanding.     Elisabeth Most, Pharm.D., BCPS, CPP Clinical Pharmacist Pager: [phone] Phone: [phone] 01/31/2016 3:51 PM

## 2016-04-03 ENCOUNTER — Telehealth: Payer: Self-pay | Admitting: Rheumatology

## 2016-04-03 NOTE — Telephone Encounter (Signed)
I spoke to patient who wanted to know the name of the infusion medication we discussed at her last appointment.  I reviewed that the medication was Reclast.  We had a detailed discussion about Reclast and her concenrs.  Patient had concerns about jaw problems with the medication.  Reviewed the risk of osteonecrosis of the jaw which is mostly assocaited after major dental work.  Patient confirms she does not have any major dental work scheduled.    Patient confirms she is getting her labs from her PCP on Thursday, 04/05/16.  I asked her to send [place] those as soon as she can.  Advised patient we must have recent labs 10 days before scheduling her infusion.  Patient voiced understanding.     Elisabeth Most, Pharm.D., BCPS, CPP Clinical Pharmacist Pager: [phone] Phone: [phone] 04/03/2016 3:01 PM

## 2016-04-03 NOTE — Telephone Encounter (Signed)
Patient has questions for Dr. Koleen Nimrod about an infusion she is supposed to start. Patient is unsure of the name of the infusion. Please call patient back at [phone].

## 2016-04-04 ENCOUNTER — Telehealth: Payer: Self-pay | Admitting: Rheumatology

## 2016-04-04 NOTE — Telephone Encounter (Signed)
I spoke to patient.  I informed her that we did a benefits investigation in January and no pre-certification was required.  Patient reports she was told yesterday that she does need a prior authorization.   Chasta, can you call [employer] to clarify? Thanks!

## 2016-04-04 NOTE — Telephone Encounter (Signed)
Patient has questions for Dr. Koleen Nimrod about the reclast infusion and the pre approval process. She said she spoke with Dr.Henderson yesterday but did not know about the pre approval process. Please call patient.

## 2016-04-04 NOTE — Telephone Encounter (Signed)
[employer] and spoke with Sophia who states that Ms. Levene's infusion for reclast will not require a pre-certification.   Reference number: [id number] Phone number: [phone]  Spoke with Ms. Grisanti to inform her and she voiced understanding. She denies any other questions about her medication at this time.   Charda Janis, [place], CPhT 2:10 PM

## 2016-04-05 DIAGNOSIS — Z79899 Other long term (current) drug therapy: Secondary | ICD-10-CM | POA: Diagnosis not present

## 2016-04-05 DIAGNOSIS — E119 Type 2 diabetes mellitus without complications: Secondary | ICD-10-CM | POA: Diagnosis not present

## 2016-04-05 DIAGNOSIS — D580 Hereditary spherocytosis: Secondary | ICD-10-CM | POA: Diagnosis not present

## 2016-04-10 ENCOUNTER — Telehealth: Payer: Self-pay | Admitting: Pharmacist

## 2016-04-10 NOTE — Telephone Encounter (Signed)
Patient had labs 04/05/16 and was supposed to send labs over in order to schedule Reclast infusion.  I have been on the lookout for her labs but we have not received them.  I spoke to patient who confirmed she had labs drawn and requested them to send [place] the results yesterday.  The PCP office was supposed to call her when they sent the results, but patient has not heard anything yet.  Patient plans to follow up with PCP office today if she has not heard anything.  I advised her to call us once the labs are sent so we can look for them.    Elisabeth Most, Pharm.D., BCPS, CPP Clinical Pharmacist Pager: [phone] Phone: [phone] 04/10/2016 8:40 AM

## 2016-04-11 NOTE — Telephone Encounter (Signed)
Patient left message on voice mail (yesterday) requesting to speak with the Pharmacist.  She states she went tot the dentist yesterday and now has questions about the infusion.

## 2016-04-11 NOTE — Telephone Encounter (Signed)
I spoke to patient.  She said Dr. Ria Comment office sent the labs.  Informed her we still have not received those.  I tried to call Dr. Ria Comment office to request them.  Their office closed at noon today.  Will try again in the morning.   Patient had questions regarding Reclast and osteonecrosis.  She said she saw her dentist yesterday and had a good check up.  She said the dentist informed her osteonecrosis was only a concern if she had to have teeth extracted.  She confirms she does not need to have any teeth extracted.  I explained to patient that alendronate which she was previously on also carries risk for osteonecrosis.  Patient states she did not realize that.  Patient wants to proceed with Reclast, but she states she also plans to talk to Dr. Willey Blade about Reclast tomorrow and will let us know what she decides about initiating the medication.   Elisabeth Most, Pharm.D., BCPS, CPP Clinical Pharmacist Pager: [phone] Phone: [phone] 04/11/2016 3:22 PM

## 2016-04-12 ENCOUNTER — Other Ambulatory Visit: Payer: Self-pay | Admitting: Radiology

## 2016-04-12 ENCOUNTER — Telehealth: Payer: Self-pay | Admitting: *Deleted

## 2016-04-12 DIAGNOSIS — M810 Age-related osteoporosis without current pathological fracture: Secondary | ICD-10-CM

## 2016-04-12 DIAGNOSIS — M1990 Unspecified osteoarthritis, unspecified site: Secondary | ICD-10-CM | POA: Diagnosis not present

## 2016-04-12 DIAGNOSIS — E1149 Type 2 diabetes mellitus with other diabetic neurological complication: Secondary | ICD-10-CM | POA: Diagnosis not present

## 2016-04-12 NOTE — Telephone Encounter (Addendum)
Patient returned call.  I reviewed the Reclast protocol and noted that labs must be within 30 days of infusion not 10 days.  I informed patient of this.  She will need Reclast before 05/04/16.  I provided patient with the phone numbers for [facility] and [place].  Patient confirms she will call to schedule her infusion.   Elisabeth Most, Pharm.D., BCPS, CPP Clinical Pharmacist Pager: [phone] Phone: [phone] 04/12/2016 5:21 PM

## 2016-04-12 NOTE — Telephone Encounter (Signed)
Ok to infuse Reclast

## 2016-04-12 NOTE — Telephone Encounter (Signed)
Likely this will not be able to be done by then I called patient left message with her husband to have her call me back. The number for Lauren Knox is [phone] or [phone] / if she wants to call for [facility] infusion the number is [phone] / I have placed order for STAT CMP also so they can draw this with the infusion, since labs will be out of date.   Should we double check? I think the labs are good for 30 days instead of 10 days?

## 2016-04-12 NOTE — Telephone Encounter (Signed)
Received lab results from PCP's office drawn on 04/05/16.  CMP- WNL UA- WNL CBC WNL except elevated platelets at 406 Lipid panel total cholesterol 210 Hgb A1c 5.2  Vitamin D 40.9

## 2016-04-12 NOTE — Telephone Encounter (Signed)
I spoke to Vicente Males at Dr. Ria Comment office who confirms she will fax over patient's labs.

## 2016-04-12 NOTE — Telephone Encounter (Addendum)
Received call from patient who states that after talking to Dr. Willey Blade she has decided she does want to initiate Reclast.    Received labs from Dr. Ria Comment office.  Labs drawn on 04/05/16.  CMP normal (GFR 71, Calcium 10), vitamin D 40.9.  Okay to proceed with Reclast?  Patient is aware Reclast must be scheduled within 10 days of labs.

## 2016-04-12 NOTE — Telephone Encounter (Signed)
Amy, can you place Reclast orders and inform patient on how to schedule infusion?  Infusion must be tomorrow otherwise she will need repeat labs. Patient is aware.

## 2016-04-17 ENCOUNTER — Telehealth: Payer: Self-pay | Admitting: Rheumatology

## 2016-04-17 NOTE — Telephone Encounter (Signed)
Patient advised that the orders are in the computer. Patient okay to call and schedule infusion. Patient advise to let the nurse at [facility] to call the office if there is any concerns.

## 2016-04-17 NOTE — Telephone Encounter (Signed)
Left message to advise Lauren Knox that orders are in the computer and to call the office if she has any questions.

## 2016-04-17 NOTE — Telephone Encounter (Signed)
Candy Sledge from [facility] called stating that she is going to send a fax in order have the patient's infusion scheduled.  Once you receive the fax, fill it out and they will get her infusion scheduled.  CB#[phone].  Thank you.

## 2016-04-17 NOTE — Telephone Encounter (Signed)
Patient called stating that she is suppose to have an infusion at [facility]. She called them and they stated that they do not have her information in order to schedule her infusion.  CB#[phone] or [phone].  Thank you.

## 2016-04-30 ENCOUNTER — Encounter (HOSPITAL_COMMUNITY)
Admission: RE | Admit: 2016-04-30 | Discharge: 2016-04-30 | Disposition: A | Discharge status: Home / Self Care | Payer: Medicare HMO | Source: Ambulatory Visit | Attending: Rheumatology | Admitting: Rheumatology

## 2016-04-30 DIAGNOSIS — M810 Age-related osteoporosis without current pathological fracture: Secondary | ICD-10-CM | POA: Diagnosis present

## 2016-04-30 MED ORDER — SODIUM CHLORIDE 0.9 % IV SOLN
INTRAVENOUS | Status: DC
  Administered 2016-04-30: 250 mL via INTRAVENOUS

## 2016-04-30 MED ORDER — ZOLEDRONIC ACID 5 MG/100ML IV SOLN
5.0000 mg | Freq: Once | INTRAVENOUS | Status: AC
  Administered 2016-04-30: 5 mg via INTRAVENOUS

## 2016-04-30 MED ORDER — ZOLEDRONIC ACID 5 MG/100ML IV SOLN
INTRAVENOUS | Status: AC
  Filled 2016-04-30: qty 100

## 2016-04-30 NOTE — Discharge Instructions (Signed)

## 2016-05-17 DIAGNOSIS — Z124 Encounter for screening for malignant neoplasm of cervix: Secondary | ICD-10-CM | POA: Diagnosis not present

## 2016-05-17 DIAGNOSIS — Z1231 Encounter for screening mammogram for malignant neoplasm of breast: Secondary | ICD-10-CM | POA: Diagnosis not present

## 2016-05-17 DIAGNOSIS — Z6824 Body mass index (BMI) 24.0-24.9, adult: Secondary | ICD-10-CM | POA: Diagnosis not present

## 2016-06-06 DIAGNOSIS — H01022 Squamous blepharitis right lower eyelid: Secondary | ICD-10-CM | POA: Diagnosis not present

## 2016-06-06 DIAGNOSIS — H01025 Squamous blepharitis left lower eyelid: Secondary | ICD-10-CM | POA: Diagnosis not present

## 2016-06-06 DIAGNOSIS — H10413 Chronic giant papillary conjunctivitis, bilateral: Secondary | ICD-10-CM | POA: Diagnosis not present

## 2016-06-06 DIAGNOSIS — H2513 Age-related nuclear cataract, bilateral: Secondary | ICD-10-CM | POA: Diagnosis not present

## 2016-06-06 DIAGNOSIS — H01021 Squamous blepharitis right upper eyelid: Secondary | ICD-10-CM | POA: Diagnosis not present

## 2016-06-06 DIAGNOSIS — E119 Type 2 diabetes mellitus without complications: Secondary | ICD-10-CM | POA: Diagnosis not present

## 2016-06-06 DIAGNOSIS — H40013 Open angle with borderline findings, low risk, bilateral: Secondary | ICD-10-CM | POA: Diagnosis not present

## 2016-06-06 DIAGNOSIS — H01024 Squamous blepharitis left upper eyelid: Secondary | ICD-10-CM | POA: Diagnosis not present

## 2016-07-27 NOTE — Progress Notes (Signed)
Office Visit Note  Patient: Lauren Knox             Date of Birth: 29-Dec-1945           MRN: [id number]             PCP: Asencion Noble, MD Referring: Asencion Noble, MD Visit Date: 07/30/2016 Occupation: @GUAROCC @    Subjective:  Pain of the Lower Back and Medication Management (had reclast infusion in April )   History of Present Illness: Lauren Knox is a 71 y.o. female with history of osteoporosis, osteoarthritis and disc disease. She continues to have some thoracic pain. She states the pain is precipitated after lifting weights. None of the other joints are painful. She has some stiffness in her hands. She had her last Reclast infusion on 04/30/2016 and tolerated it well. She does have mild Raynauds.  Activities of Daily Living:  Patient reports morning stiffness for 5 minutes.   Patient Denies nocturnal pain.  Difficulty dressing/grooming: Denies Difficulty climbing stairs: Denies Difficulty getting out of chair: Denies Difficulty using hands for taps, buttons, cutlery, and/or writing: Denies   Review of Systems  Constitutional: Negative for fatigue, night sweats, weight gain, weight loss and weakness.  HENT: Positive for mouth dryness. Negative for mouth sores, trouble swallowing, trouble swallowing and nose dryness.   Eyes: Positive for dryness. Negative for pain, redness and visual disturbance.  Respiratory: Negative for cough, shortness of breath and difficulty breathing.   Cardiovascular: Negative for chest pain, palpitations, hypertension, irregular heartbeat and swelling in legs/feet.  Gastrointestinal: Negative for blood in stool, constipation and diarrhea.  Endocrine: Negative for increased urination.  Genitourinary: Negative for vaginal dryness.  Musculoskeletal: Positive for morning stiffness. Negative for arthralgias, joint pain, joint swelling, myalgias, muscle weakness, muscle tenderness and myalgias.  Skin: Positive for color change. Negative for rash,  hair loss, skin tightness, ulcers and sensitivity to sunlight.  Allergic/Immunologic: Negative for susceptible to infections.  Neurological: Negative for dizziness, memory loss and night sweats.  Hematological: Negative for swollen glands.  Psychiatric/Behavioral: Negative for depressed mood and sleep disturbance. The patient is not nervous/anxious.     PMFS History:  Patient Active Problem List   Diagnosis Date Noted  . 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 ADD (attention deficit disorder) 01/27/2016  . Rosacea 01/27/2016  . Vitamin D deficiency 01/27/2016  . 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 Muscle weakness (generalized) 09/17/2013  . Scoliosis concern 09/17/2013  . Stiffness of joint, not elsewhere classified, pelvic region and thigh 09/17/2013  . Difficulty in walking(719.7) 09/17/2013    Past Medical History:  Diagnosis Date  . ADD (attention deficit disorder) 01/27/2016  . 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 Osteopenia   . Raynaud's disease   . 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 Rosacea 01/27/2016  . Varicose veins   . Vitamin D deficiency 01/27/2016    Family History  Problem Relation Age of Onset  . Cancer Mother   . Cancer Father   . Heart attack Father    Past Surgical History:  Procedure Laterality Date  . ABDOMINAL HYSTERECTOMY     Social History   Social History Narrative  . No narrative on file     Objective: Vital Signs: BP 122/62   Pulse 70   Resp 14  Ht 5\' 4"  (1.626 m)   Wt 141 lb (64 kg)   BMI 24.20 kg/m    Physical Exam  Constitutional: She is oriented to person, place, and time. She appears well-developed and well-nourished.  HENT:  Head: Normocephalic and atraumatic.  Eyes: Conjunctivae and  EOM are normal.  Neck: Normal range of motion.  Cardiovascular: Normal rate, regular rhythm, normal heart sounds and intact distal pulses.   Pulmonary/Chest: Effort normal and breath sounds normal.  Abdominal: Soft. Bowel sounds are normal.  Lymphadenopathy:    She has no cervical adenopathy.  Neurological: She is alert and oriented to person, place, and time.  Skin: Skin is warm and dry. Capillary refill takes less than 2 seconds.  Digital hyperemia noted.  Psychiatric: She has a normal mood and affect. Her behavior is normal.  Nursing note and vitals reviewed.    Musculoskeletal Exam: C-spine good range of motion. She is some thoracic kyphosis. She is good range of motion of her lumbar spine. Shoulder joints although joints wrist joints are good range of motion. She has thickening of PIP/DIP joints in her hands consistent with osteoarthritis. Hip joints knee joints ankles MTPs PIPs DIPs are good range of motion with no synovitis.  CDAI Exam: No CDAI exam completed.    Investigation: Findings:  04/30/2016 Reclast infusion DEXA T-score: -2.5 (05/11/15)  04/06/2016 CMP normal, UA normal, CBC normal, lipid panel LDL 98, hemoglobin A1c 5.2, vitamin D 40.9  Imaging: No results found.  Speciality Comments: No specialty comments available.    Procedures:  No procedures performed Allergies: Codeine   Assessment / Plan:     Visit Diagnoses: Age-related osteoporosis without current pathological fracture - bone density from 05/11/2015 showed a T score of -2.5 in the femoral neck region which is down by -5.3% from the 2015.Reclast infusion #1 04/30/2016. She is taking Fosamax in the past for her to that. We will check her DEXA scan in April 2019 to determine if she needs to continue Reclast on yearly or every other year.  Vitamin D deficiency: Her last vitamin D level was normal.  Postural kyphosis of thoracolumbar region: She continues to have some thoracic pain.  DDD (degenerative  disc disease), thoracic: Handout on thoracic exercises was given.  Primary osteoarthritis of both hands: Joint protection and muscle strengthening discussed.  Raynaud'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: Protective clothing was discussed.  History of rosacea  History of attention deficit disorder    Orders: No orders of the defined types were placed in this encounter.  No orders of the defined types were placed in this encounter.     Follow-Up Instructions: Return for Osteoporosis, Osteoarthritis.in March 2019.   Bo Merino, MD  Note - This record has been created using Editor, commissioning.  Chart creation errors have been sought, but may not always  have been located. Such creation errors do not reflect on  the standard of medical care.

## 2016-07-30 ENCOUNTER — Encounter (INDEPENDENT_AMBULATORY_CARE_PROVIDER_SITE_OTHER): Payer: Self-pay

## 2016-07-30 ENCOUNTER — Encounter: Payer: Self-pay | Admitting: Rheumatology

## 2016-07-30 ENCOUNTER — Ambulatory Visit (INDEPENDENT_AMBULATORY_CARE_PROVIDER_SITE_OTHER): Payer: Medicare HMO | Admitting: Rheumatology

## 2016-07-30 VITALS — BP 122/62 | HR 70 | Resp 14 | Ht 64.0 in | Wt 141.0 lb

## 2016-07-30 DIAGNOSIS — M19042 Primary osteoarthritis, left hand: Secondary | ICD-10-CM

## 2016-07-30 DIAGNOSIS — M810 Age-related osteoporosis without current pathological fracture: Secondary | ICD-10-CM | POA: Diagnosis not present

## 2016-07-30 DIAGNOSIS — M19041 Primary osteoarthritis, right hand: Secondary | ICD-10-CM

## 2016-07-30 DIAGNOSIS — Z8659 Personal history of other mental and behavioral disorders: Secondary | ICD-10-CM

## 2016-07-30 DIAGNOSIS — M5134 Other intervertebral disc degeneration, thoracic region: Secondary | ICD-10-CM | POA: Diagnosis not present

## 2016-07-30 DIAGNOSIS — E559 Vitamin D deficiency, unspecified: Secondary | ICD-10-CM

## 2016-07-30 DIAGNOSIS — M6281 Muscle weakness (generalized): Secondary | ICD-10-CM

## 2016-07-30 DIAGNOSIS — Z872 Personal history of diseases of the skin and subcutaneous tissue: Secondary | ICD-10-CM | POA: Diagnosis not present

## 2016-07-30 DIAGNOSIS — M4005 Postural kyphosis, thoracolumbar region: Secondary | ICD-10-CM | POA: Diagnosis not present

## 2016-07-30 DIAGNOSIS — I73 Raynaud'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: Secondary | ICD-10-CM | POA: Diagnosis not present

## 2016-07-30 DIAGNOSIS — R69 Illness, unspecified: Secondary | ICD-10-CM | POA: Diagnosis not present

## 2016-07-30 NOTE — Progress Notes (Signed)
Rheumatology Medication Review by a Pharmacist Does the patient feel that his/her medications are working for him/her?  Yes Has the patient been experiencing any side effects to the medications prescribed?  No Does the patient have any problems obtaining medications?  No  Issues to address at subsequent visits: None   Pharmacist comments:  Lauren Knox is a 71 yo F who presents for follow up of osteoporosis.  She received Reclast infusion on 04/30/16.  Patient reports tolerating the infusion well.  Patient confirms she is still taking calcium/vitamin D.  Reviewed recommended dose of calcium 1200 mg daily.  Patient voiced understanding.    Lauren Knox, Pharm.D., BCPS, CPP Clinical Pharmacist Pager: [phone] Phone: [phone] 07/30/2016 11:39 AM

## 2016-07-30 NOTE — Patient Instructions (Signed)

## 2016-11-02 DIAGNOSIS — R69 Illness, unspecified: Secondary | ICD-10-CM | POA: Diagnosis not present

## 2016-11-07 DIAGNOSIS — L821 Other seborrheic keratosis: Secondary | ICD-10-CM | POA: Diagnosis not present

## 2016-11-07 DIAGNOSIS — L814 Other melanin hyperpigmentation: Secondary | ICD-10-CM | POA: Diagnosis not present

## 2016-11-07 DIAGNOSIS — Z85828 Personal history of other malignant neoplasm of skin: Secondary | ICD-10-CM | POA: Diagnosis not present

## 2016-11-07 DIAGNOSIS — D225 Melanocytic nevi of trunk: Secondary | ICD-10-CM | POA: Diagnosis not present

## 2016-11-07 DIAGNOSIS — L309 Dermatitis, unspecified: Secondary | ICD-10-CM | POA: Diagnosis not present

## 2016-11-07 DIAGNOSIS — D1801 Hemangioma of skin and subcutaneous tissue: Secondary | ICD-10-CM | POA: Diagnosis not present

## 2016-11-07 DIAGNOSIS — Z23 Encounter for immunization: Secondary | ICD-10-CM | POA: Diagnosis not present

## 2016-11-07 DIAGNOSIS — D2272 Melanocytic nevi of left lower limb, including hip: Secondary | ICD-10-CM | POA: Diagnosis not present

## 2016-12-20 ENCOUNTER — Telehealth: Payer: Self-pay

## 2016-12-20 DIAGNOSIS — G8929 Other chronic pain: Secondary | ICD-10-CM

## 2016-12-20 DIAGNOSIS — M545 Low back pain: Secondary | ICD-10-CM

## 2016-12-20 NOTE — Telephone Encounter (Signed)
Called the patient to verify benefits for 2019. Pt currently received infusions that may require a pre-certification. Patient states that she will continue to have coverage with [facility].   Called [employer] to verify pre-certification for 0459. Spoke with Melissa who states that she could not give me any information for 2019 as that information has not been uploaded yet. Will check back in 2019.   Dylana Shaw, [place], CPhT 11:01 AM

## 2016-12-20 NOTE — Telephone Encounter (Signed)
Patient states she has spoke with you before about the pain in her back. Patient states that you mentioned it may be spurs. Patient states her back flared up for several days. Patient states the pain was in her back, her hip and going down her leg. Patient describes it as a burning and stinging pain. Patient states she has been heating and icing it. Patient would like to know if she should go to see a back specialist. Please advise.

## 2016-12-20 NOTE — Telephone Encounter (Signed)
Yes , we can refer her to Dr. Lorin Mercy or Dr. Louanne Skye.

## 2016-12-20 NOTE — Telephone Encounter (Signed)
Patient advised and referral placed.  

## 2016-12-20 NOTE — Telephone Encounter (Signed)
Spoke to patient who states that she has been having some back pain that started during Thanksgiving and is still on going. She mentions that at her last appointment Dr. Estanislado Pandy informed her that she may need to be seen by a back doctor. She is requesting that someone calls her back to see what she can do. X-Ray, Back Doctor or a referral to someone who can help. Please advise. Thank you!  Shirley Bolle, [place], CPhT 11:04 AM

## 2016-12-26 DIAGNOSIS — F341 Dysthymic disorder: Secondary | ICD-10-CM | POA: Diagnosis not present

## 2016-12-26 DIAGNOSIS — R69 Illness, unspecified: Secondary | ICD-10-CM | POA: Diagnosis not present

## 2017-01-17 ENCOUNTER — Ambulatory Visit (INDEPENDENT_AMBULATORY_CARE_PROVIDER_SITE_OTHER): Payer: Medicare HMO | Admitting: Orthopaedic Surgery

## 2017-01-17 ENCOUNTER — Ambulatory Visit (INDEPENDENT_AMBULATORY_CARE_PROVIDER_SITE_OTHER): Payer: Medicare HMO

## 2017-01-17 VITALS — BP 168/105 | HR 81 | Ht 60.0 in | Wt 141.0 lb

## 2017-01-17 DIAGNOSIS — G8929 Other chronic pain: Secondary | ICD-10-CM | POA: Diagnosis not present

## 2017-01-17 DIAGNOSIS — M5440 Lumbago with sciatica, unspecified side: Secondary | ICD-10-CM | POA: Diagnosis not present

## 2017-01-17 MED ORDER — TIZANIDINE HCL 4 MG PO TABS: 4.0000 mg | ORAL_TABLET | Freq: Four times a day (QID) | ORAL | 0 refills | Status: DC | PRN

## 2017-01-21 ENCOUNTER — Encounter (INDEPENDENT_AMBULATORY_CARE_PROVIDER_SITE_OTHER): Payer: Self-pay | Admitting: Orthopaedic Surgery

## 2017-01-21 NOTE — Progress Notes (Signed)
Office Visit Note   Patient: Lauren Knox           Date of Birth: 10-Mar-1945           MRN: [id number] Visit Date: 01/17/2017              Requested by: Bo Merino, MD [address] [place], [place] [address] PCP: Asencion Noble, MD   Assessment & Plan: Visit Diagnoses:  1. Chronic midline low back pain with sciatica, sciatica laterality unspecified     Plan: Patient has some lumbar disc degeneration previously document L3-4 and L4-5 level  By MRI 2002.  She has intermittent symptoms likely related to a disc bulge.  At present her symptoms are not severe enough to consider operative intervention.  She needs to seek treatment for hypertension which we discussed.  If she has increase in her symptoms return for reevaluation.  Discussed the pathophysiology of disc degeneration with intermittent episodic episodes, which she is experiencing.  It is she has sustained or increasing pain or weakness she can return for reevaluation and repeat MRI scan.  The opportunity to share in her care.  Follow-Up Instructions: No Follow-up on file.   Orders:  Orders Placed This Encounter  Procedures  . XR Lumbar Spine 2-3 Views   Meds ordered this encounter  Medications  . tiZANidine (ZANAFLEX) 4 MG tablet    Sig: Take 1 tablet (4 mg total) by mouth every 6 (six) hours as needed for muscle spasms.    Dispense:  90 tablet    Refill:  0      Procedures: No procedures performed   Clinical Data: No additional findings.   Subjective: Chief Complaint  Patient presents with  . Lower Back - Pain    HPI 72 year old female referred by Dr. Estanislado Pandy for ongoing problems with back pain back spasms that date back several years.  She is to have episodes 1-2 times per year that lasted for a few days got better with stretching and rest.  Therapy in the past with temporary relief.  Occasionally has leg pain no numbness or tingling.  She states these recently have increased 3 times per year  resolved.  Lumbar MRI scan 2002 showed disc bulges at L3-4 with mild stenosis also at L4-5 with bilateral subarticular lateral recess stenosis worse on the right than left.  L5-S1 level was normal.  Patient denies fever chills no associated bowel or bladder symptoms.  Blood pressure is elevated today at 168/105 which we discussed.  Patient does take Ritalin 20 mg 2-3 times per day.  Currently is not taking any medication for hypertension.  Review of Systems 14 point review of systems positive for depression, ADHD, osteoporosis, history of pneumonia.  Previous gallbladder surgery 1957 tonsillectomy 1960 obstruction 1986 C-section 1975.  Hypertension noted today.   Objective: Vital Signs: BP (!) 168/105   Pulse 81   Ht 5' (1.524 m)   Wt 141 lb (64 kg)   BMI 27.54 kg/m   Physical Exam  Constitutional: She is oriented to person, place, and time. She appears well-developed.  HENT:  Head: Normocephalic.  Right Ear: External ear normal.  Left Ear: External ear normal.  Eyes: Pupils are equal, round, and reactive to light.  Neck: No tracheal deviation present. No thyromegaly present.  Cardiovascular: Normal rate.  Pulmonary/Chest: Effort normal.  Abdominal: Soft.  Neurological: She is alert and oriented to person, place, and time.  Skin: Skin is warm and dry.  Psychiatric: She has a  normal mood and affect. Her behavior is normal.    Ortho Exam patient is able to go from sitting to standing to ambulate with normal heel toe gait.  Logroll right and leftis normal and not painful.    Her pulses are palpable.  The dorsum plantar surface of her foot is normal and symmetrical.  Symmetrical quad strength.  Specialty Comments:  No specialty comments available.  Imaging: No results found.   PMFS History: Patient Active Problem List   Diagnosis Date Noted  . 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 ADD (attention deficit disorder) 01/27/2016  . Rosacea 01/27/2016  . Vitamin D deficiency 01/27/2016  . 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 Muscle weakness (generalized) 09/17/2013  . Scoliosis concern 09/17/2013  . Stiffness of joint, not elsewhere classified, pelvic region and thigh 09/17/2013  . Difficulty in walking(719.7) 09/17/2013   Past Medical History:  Diagnosis Date  . ADD (attention deficit disorder) 01/27/2016  . 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 Osteopenia   . Raynaud's disease   . 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 Rosacea 01/27/2016  . Varicose veins   . Vitamin D deficiency 01/27/2016    Family History  Problem Relation Age of Onset  . Cancer Mother   . Cancer Father   . Heart attack Father     Past Surgical History:  Procedure Laterality Date  . ABDOMINAL HYSTERECTOMY     Social History   Occupational History  . Not on file  Tobacco Use  . Smoking status: Never Smoker  . Smokeless tobacco: Never Used  Substance and Sexual Activity  . Alcohol use: Yes    Alcohol/week: 2.4 oz    Types: 4 Standard drinks or equivalent per week  . Drug use: No  . Sexual activity: Not on file

## 2017-03-04 NOTE — Progress Notes (Deleted)
   Office Visit Note  Patient: Lauren Knox             Date of Birth: [birth year]           MRN: [id number]             PCP: Asencion Noble, MD Referring: Asencion Noble, MD Visit Date: 03/18/2017 Occupation: @GUAROCC @    Subjective:  No chief complaint on file.   History of Present Illness: Lauren Knox is a 72 y.o. female ***   Activities of Daily Living:  Patient reports morning stiffness for *** {minute/hour:19697}.   Patient {ACTIONS;DENIES/REPORTS:21021675::"Denies"} nocturnal pain.  Difficulty dressing/grooming: {ACTIONS;DENIES/REPORTS:21021675::"Denies"} Difficulty climbing stairs: {ACTIONS;DENIES/REPORTS:21021675::"Denies"} Difficulty getting out of chair: {ACTIONS;DENIES/REPORTS:21021675::"Denies"} Difficulty using hands for taps, buttons, cutlery, and/or writing: {ACTIONS;DENIES/REPORTS:21021675::"Denies"}   No Rheumatology ROS completed.   PMFS History:  Patient Active Problem List   Diagnosis Date Noted  . 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 ADD (attention deficit disorder) 01/27/2016  . Rosacea 01/27/2016  . Vitamin D deficiency 01/27/2016  . 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 Muscle weakness (generalized) 09/17/2013  . Scoliosis concern 09/17/2013  . Stiffness of joint, not elsewhere classified, pelvic region and thigh 09/17/2013  . Difficulty in walking(719.7) 09/17/2013    Past Medical History:  Diagnosis Date  . ADD (attention deficit disorder) 01/27/2016  . 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 Osteopenia   . Raynaud's disease   . 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 Rosacea 01/27/2016  . Varicose veins   . Vitamin D deficiency 01/27/2016    Family History  Problem Relation Age of Onset  . Cancer  Mother   . Cancer Father   . Heart attack Father    Past Surgical History:  Procedure Laterality Date  . ABDOMINAL HYSTERECTOMY     Social History   Social History Narrative  . Not on file     Objective: Vital Signs: There were no vitals taken for this visit.   Physical Exam   Musculoskeletal Exam: ***  CDAI Exam: No CDAI exam completed.    Investigation: No additional findings. Labs: 04/05/2016   Imaging: No results found.  Speciality Comments: No specialty comments available.    Procedures:  No procedures performed Allergies: Codeine   Assessment / Plan:     Visit Diagnoses: No diagnosis found.    Orders: No orders of the defined types were placed in this encounter.  No orders of the defined types were placed in this encounter.   Face-to-face time spent with patient was *** minutes. 50% of time was spent in counseling and coordination of care.  Follow-Up Instructions: No Follow-up on file.   Earnestine Mealing, CMA  Note - This record has been created using Editor, commissioning.  Chart creation errors have been sought, but may not always  have been located. Such creation errors do not reflect on  the standard of medical care.

## 2017-03-15 NOTE — Progress Notes (Signed)
Office Visit Note  Patient: Lauren Knox             Date of Birth: 05-Jun-1945           MRN: [id number]             PCP: Asencion Noble, MD Referring: Asencion Noble, MD Visit Date: 03/26/2017 Occupation: @GUAROCC @    Subjective:  Other (back pain )   History of Present Illness: Lauren Knox is a 72 y.o. female with history of DDD and osteoporosis.  She states she still continues to have some back muscle spasms.  She saw back specialist who only offered surgery.  She has been having increased discomfort and muscle spasms.  Been doing exercises at home.  She had her Reclast infusion in April 2018.  She has repeat DEXA scan as scheduled in May 2019.  She does not have much discomfort in her hands.  Activities of Daily Living:  Patient reports morning stiffness for 2-3 minutes.   Patient Denies nocturnal pain.  Difficulty dressing/grooming: Denies Difficulty climbing stairs: Denies Difficulty getting out of chair: Reports Difficulty using hands for taps, buttons, cutlery, and/or writing: Reports   Review of Systems  Constitutional: Negative for fatigue, night sweats, weight gain, weight loss and weakness.  HENT: Negative for mouth sores, trouble swallowing, trouble swallowing, mouth dryness and nose dryness.   Eyes: Negative for pain, redness, visual disturbance and dryness.  Respiratory: Negative for cough, shortness of breath and difficulty breathing.   Cardiovascular: Negative for chest pain, palpitations, hypertension, irregular heartbeat and swelling in legs/feet.  Gastrointestinal: Negative for blood in stool, constipation and diarrhea.  Endocrine: Negative for increased urination.  Genitourinary: Negative for vaginal dryness.  Musculoskeletal: Positive for arthralgias and joint pain. Negative for joint swelling, myalgias, muscle weakness, morning stiffness, muscle tenderness and myalgias.  Skin: Negative for color change, rash, hair loss, skin tightness, ulcers and  sensitivity to sunlight.  Allergic/Immunologic: Negative for susceptible to infections.  Neurological: Negative for dizziness, memory loss and night sweats.  Hematological: Negative for swollen glands.  Psychiatric/Behavioral: Negative for depressed mood and sleep disturbance. The patient is not nervous/anxious.     PMFS History:  Patient Active Problem List   Diagnosis Date Noted  . 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 ADD (attention deficit disorder) 01/27/2016  . Rosacea 01/27/2016  . Vitamin D deficiency 01/27/2016  . 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 Muscle weakness (generalized) 09/17/2013  . Scoliosis concern 09/17/2013  . Stiffness of joint, not elsewhere classified, pelvic region and thigh 09/17/2013  . Difficulty in walking(719.7) 09/17/2013    Past Medical History:  Diagnosis Date  . ADD (attention deficit disorder) 01/27/2016  . 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 Osteopenia   . Raynaud's disease   . 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 Rosacea 01/27/2016  . Varicose veins   . Vitamin D deficiency 01/27/2016    Family History  Problem Relation Age of Onset  . Cancer Mother   . Cancer Father   . Heart attack Father   . Cancer Brother    Past Surgical History:  Procedure Laterality Date  . ABDOMINAL HYSTERECTOMY    . CHOLECYSTECTOMY    . TONSILLECTOMY     Social History   Social History Narrative  . Not on file  Objective: Vital Signs: BP (!) 160/84 (BP Location: Right Arm, Patient Position: Sitting, Cuff Size: Normal)   Pulse 80   Resp 16   Ht 5\' 4"  (1.626 m)   Wt 147 lb 8 oz (66.9 kg)   BMI 25.32 kg/m    Physical Exam  Constitutional: She is oriented to person, place, and time. She appears  well-developed and well-nourished.  HENT:  Head: Normocephalic and atraumatic.  Eyes: Conjunctivae and EOM are normal.  Neck: Normal range of motion.  Cardiovascular: Normal rate, regular rhythm, normal heart sounds and intact distal pulses.  Pulmonary/Chest: Effort normal and breath sounds normal.  Abdominal: Soft. Bowel sounds are normal.  Lymphadenopathy:    She has no cervical adenopathy.  Neurological: She is alert and oriented to person, place, and time.  Skin: Skin is warm and dry. Capillary refill takes less than 2 seconds.  Psychiatric: She has a normal mood and affect. Her behavior is normal.  Nursing note and vitals reviewed.    Musculoskeletal Exam: C-spine good range of motion.  She has thoracic kyphosis.  Lumbar spine good range of motion.  Shoulder joints elbow joints are good range of motion.  She has DIP PIP thickening consistent with osteoarthritis.  Hip joints, knee joints, ankles and MTPs with good range of motion with no synovitis.   CDAI Exam: No CDAI exam completed.    Investigation: No additional findings. No flowsheet data found. CMP Latest Ref Rng & Units 06/21/2015  Sodium 137 - 147 mmol/L 141  Potassium 3.4 - 5.3 mmol/L 4.1    Imaging: No results found.  Speciality Comments: No specialty comments available.    Procedures:  No procedures performed Allergies: Codeine   Assessment / Plan:     Visit Diagnoses: Age-related osteoporosis without current pathological fracture - 05/11/15 T score -2.5 in the fem.neck, down -5.3% in 2015.Reclast #1 04/30/2016. hx of Fosamax use. DEXA due May 2019.  We will determine next Reclast infusion after that.  Vitamin D deficiency when she is on supplement.  Postural kyphosis of thoracolumbar region: She does have some discomfort in the thoracic region with muscle spasms.  I demonstrated some of the thoracic exercises in the office today.  Raynaud'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: Currently not very  symptomatic.  Primary osteoarthritis of both hands: Joint protection muscle strengthening discussed.  DDD (degenerative disc disease), thoracic: She is seeing a back specialist in the past.  She states she was advised surgery.  History of rosacea  History of attention deficit disorder    Orders: No orders of the defined types were placed in this encounter.  No orders of the defined types were placed in this encounter.   Face-to-face time spent with patient was 20 minutes.  Greater than 50% of time was spent in counseling and coordination of care.  Follow-Up Instructions: Return in about 6 months (around 09/26/2017) for Osteoarthritis, Osteoporosis.   Bo Merino, MD  Note - This record has been created using Editor, commissioning.  Chart creation errors have been sought, but may not always  have been located. Such creation errors do not reflect on  the standard of medical care.

## 2017-03-18 ENCOUNTER — Ambulatory Visit: Payer: Medicare HMO | Admitting: Rheumatology

## 2017-03-26 ENCOUNTER — Encounter: Payer: Self-pay | Admitting: Physician Assistant

## 2017-03-26 ENCOUNTER — Ambulatory Visit: Payer: Medicare HMO | Admitting: Rheumatology

## 2017-03-26 VITALS — BP 160/84 | HR 80 | Resp 16 | Ht 64.0 in | Wt 147.5 lb

## 2017-03-26 DIAGNOSIS — Z872 Personal history of diseases of the skin and subcutaneous tissue: Secondary | ICD-10-CM

## 2017-03-26 DIAGNOSIS — M4005 Postural kyphosis, thoracolumbar region: Secondary | ICD-10-CM

## 2017-03-26 DIAGNOSIS — M19041 Primary osteoarthritis, right hand: Secondary | ICD-10-CM

## 2017-03-26 DIAGNOSIS — M5134 Other intervertebral disc degeneration, thoracic region: Secondary | ICD-10-CM | POA: Diagnosis not present

## 2017-03-26 DIAGNOSIS — I73 Raynaud'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: Secondary | ICD-10-CM | POA: Diagnosis not present

## 2017-03-26 DIAGNOSIS — E559 Vitamin D deficiency, unspecified: Secondary | ICD-10-CM

## 2017-03-26 DIAGNOSIS — Z8659 Personal history of other mental and behavioral disorders: Secondary | ICD-10-CM

## 2017-03-26 DIAGNOSIS — M810 Age-related osteoporosis without current pathological fracture: Secondary | ICD-10-CM

## 2017-03-26 DIAGNOSIS — R69 Illness, unspecified: Secondary | ICD-10-CM | POA: Diagnosis not present

## 2017-03-26 DIAGNOSIS — M19042 Primary osteoarthritis, left hand: Secondary | ICD-10-CM

## 2017-04-04 DIAGNOSIS — Z6824 Body mass index (BMI) 24.0-24.9, adult: Secondary | ICD-10-CM | POA: Diagnosis not present

## 2017-04-04 DIAGNOSIS — I10 Essential (primary) hypertension: Secondary | ICD-10-CM | POA: Diagnosis not present

## 2017-04-11 DIAGNOSIS — I10 Essential (primary) hypertension: Secondary | ICD-10-CM | POA: Diagnosis not present

## 2017-04-30 ENCOUNTER — Encounter (HOSPITAL_COMMUNITY): Payer: Medicare HMO

## 2017-05-02 ENCOUNTER — Encounter: Payer: Self-pay | Admitting: Physician Assistant

## 2017-05-02 DIAGNOSIS — I10 Essential (primary) hypertension: Secondary | ICD-10-CM | POA: Diagnosis not present

## 2017-05-02 DIAGNOSIS — Z79899 Other long term (current) drug therapy: Secondary | ICD-10-CM | POA: Diagnosis not present

## 2017-05-02 DIAGNOSIS — E119 Type 2 diabetes mellitus without complications: Secondary | ICD-10-CM | POA: Diagnosis not present

## 2017-05-09 DIAGNOSIS — M810 Age-related osteoporosis without current pathological fracture: Secondary | ICD-10-CM | POA: Diagnosis not present

## 2017-05-09 DIAGNOSIS — Z6825 Body mass index (BMI) 25.0-25.9, adult: Secondary | ICD-10-CM | POA: Diagnosis not present

## 2017-05-09 DIAGNOSIS — Z0001 Encounter for general adult medical examination with abnormal findings: Secondary | ICD-10-CM | POA: Diagnosis not present

## 2017-05-09 DIAGNOSIS — I10 Essential (primary) hypertension: Secondary | ICD-10-CM | POA: Diagnosis not present

## 2017-05-28 ENCOUNTER — Encounter: Payer: Self-pay | Admitting: Rheumatology

## 2017-05-28 DIAGNOSIS — Z01419 Encounter for gynecological examination (general) (routine) without abnormal findings: Secondary | ICD-10-CM | POA: Diagnosis not present

## 2017-05-28 DIAGNOSIS — Z6824 Body mass index (BMI) 24.0-24.9, adult: Secondary | ICD-10-CM | POA: Diagnosis not present

## 2017-05-28 DIAGNOSIS — Z1231 Encounter for screening mammogram for malignant neoplasm of breast: Secondary | ICD-10-CM | POA: Diagnosis not present

## 2017-05-28 DIAGNOSIS — N958 Other specified menopausal and perimenopausal disorders: Secondary | ICD-10-CM | POA: Diagnosis not present

## 2017-05-28 DIAGNOSIS — M8588 Other specified disorders of bone density and structure, other site: Secondary | ICD-10-CM | POA: Diagnosis not present

## 2017-06-11 DIAGNOSIS — H16223 Keratoconjunctivitis sicca, not specified as Sjogren's, bilateral: Secondary | ICD-10-CM | POA: Diagnosis not present

## 2017-06-11 DIAGNOSIS — H40003 Preglaucoma, unspecified, bilateral: Secondary | ICD-10-CM | POA: Diagnosis not present

## 2017-06-17 ENCOUNTER — Telehealth: Payer: Self-pay | Admitting: Rheumatology

## 2017-06-17 NOTE — Telephone Encounter (Signed)
Patient called stating she had her bone density scan last week at [facility] in [place] with Dr. Julien Girt.  Patient states Dr. Estanislado Pandy wanted the results before she ordered another Reclast infusion.

## 2017-06-17 NOTE — Telephone Encounter (Signed)
Patient advised that we have not received her bone density scan. Patient states she will call [facility] and have them fax [place] the results.

## 2017-07-02 DIAGNOSIS — I10 Essential (primary) hypertension: Secondary | ICD-10-CM | POA: Diagnosis not present

## 2017-07-03 ENCOUNTER — Telehealth: Payer: Self-pay | Admitting: Rheumatology

## 2017-07-03 NOTE — Telephone Encounter (Signed)
Patient wants to know if we received the Bone Density report she mailed to [place]. Please call to advise.

## 2017-07-05 NOTE — Telephone Encounter (Signed)
Left message to advise patient we have received bone density scan results.

## 2017-07-30 DIAGNOSIS — I10 Essential (primary) hypertension: Secondary | ICD-10-CM | POA: Diagnosis not present

## 2017-07-30 DIAGNOSIS — R55 Syncope and collapse: Secondary | ICD-10-CM | POA: Diagnosis not present

## 2017-08-20 ENCOUNTER — Encounter: Payer: Self-pay | Admitting: Rheumatology

## 2017-08-20 ENCOUNTER — Ambulatory Visit (INDEPENDENT_AMBULATORY_CARE_PROVIDER_SITE_OTHER): Payer: Medicare HMO

## 2017-08-20 ENCOUNTER — Ambulatory Visit: Payer: Medicare HMO | Admitting: Rheumatology

## 2017-08-20 VITALS — BP 150/72 | HR 58 | Resp 16 | Ht 64.0 in | Wt 146.0 lb

## 2017-08-20 DIAGNOSIS — M4005 Postural kyphosis, thoracolumbar region: Secondary | ICD-10-CM | POA: Diagnosis not present

## 2017-08-20 DIAGNOSIS — M810 Age-related osteoporosis without current pathological fracture: Secondary | ICD-10-CM | POA: Diagnosis not present

## 2017-08-20 DIAGNOSIS — I73 Raynaud'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: Secondary | ICD-10-CM

## 2017-08-20 DIAGNOSIS — M19041 Primary osteoarthritis, right hand: Secondary | ICD-10-CM

## 2017-08-20 DIAGNOSIS — M62838 Other muscle spasm: Secondary | ICD-10-CM | POA: Diagnosis not present

## 2017-08-20 DIAGNOSIS — Z872 Personal history of diseases of the skin and subcutaneous tissue: Secondary | ICD-10-CM | POA: Diagnosis not present

## 2017-08-20 DIAGNOSIS — M5134 Other intervertebral disc degeneration, thoracic region: Secondary | ICD-10-CM

## 2017-08-20 DIAGNOSIS — M542 Cervicalgia: Secondary | ICD-10-CM

## 2017-08-20 DIAGNOSIS — R69 Illness, unspecified: Secondary | ICD-10-CM | POA: Diagnosis not present

## 2017-08-20 DIAGNOSIS — Z8659 Personal history of other mental and behavioral disorders: Secondary | ICD-10-CM

## 2017-08-20 DIAGNOSIS — E559 Vitamin D deficiency, unspecified: Secondary | ICD-10-CM

## 2017-08-20 DIAGNOSIS — M19042 Primary osteoarthritis, left hand: Secondary | ICD-10-CM

## 2017-08-20 MED ORDER — LIDOCAINE HCL 1 % IJ SOLN
0.5000 mL | INTRAMUSCULAR | Status: AC | PRN
  Administered 2017-08-20: .5 mL

## 2017-08-20 MED ORDER — TIZANIDINE HCL 4 MG PO TABS: 4.0000 mg | ORAL_TABLET | Freq: Every evening | ORAL | 0 refills | Status: DC | PRN

## 2017-08-20 MED ORDER — TRIAMCINOLONE ACETONIDE 40 MG/ML IJ SUSP
10.0000 mg | INTRAMUSCULAR | Status: AC | PRN
  Administered 2017-08-20: 10 mg via INTRAMUSCULAR

## 2017-08-20 NOTE — Progress Notes (Signed)
Office Visit Note  Patient: Lauren Knox             Date of Birth: 09-07-1945           MRN: [id number]             PCP: Asencion Noble, MD Referring: Asencion Noble, MD Visit Date: 08/20/2017 Occupation: @GUAROCC @  Subjective:  Neck pain   History of Present Illness: RAENELL MENSING is a 72 y.o. female with history of osteoporosis, DDD, and osteoarthritis.  She had her first Reclast infusion in April 2018.  Her most recent DEXA was 05/28/17.  She has been taking calcium and vitamin D on a daily basis.  She has been working on exercise and muscle strengthening with physical therapy for management of osteoporosis.  She has been doing some shoulder exercises which she feels may have been added to her neck and shoulder pain at this time.  She is having tenderness in the trapezius muscle on the left side as well as neck stiffness.  She describes the pain is constant.  She has been alternating ice and heat without any relief.  She denies any true injury.  The past this occurred and she did trigger point injection which provided relief.  She also took Celebrex at that time which resolved her symptoms.  She denies any other joint pain or joint swelling at this time.   Activities of Daily Living:  Patient reports morning stiffness for 3 minutes.   Patient Denies nocturnal pain.  Difficulty dressing/grooming: Denies Difficulty climbing stairs: Denies Difficulty getting out of chair: Denies Difficulty using hands for taps, buttons, cutlery, and/or writing: Denies  Review of Systems  Constitutional: Positive for fatigue.  HENT: Positive for mouth dryness. Negative for mouth sores and nose dryness.   Eyes: Positive for dryness. Negative for pain and visual disturbance.  Respiratory: Negative for cough, hemoptysis, shortness of breath and difficulty breathing.   Cardiovascular: Negative for chest pain, palpitations, hypertension and swelling in legs/feet.  Gastrointestinal: Negative for blood in  stool, constipation and diarrhea.  Endocrine: Negative for increased urination.  Genitourinary: Negative for difficulty urinating and painful urination.  Musculoskeletal: Positive for arthralgias, joint pain, muscle weakness, morning stiffness and muscle tenderness. Negative for joint swelling, myalgias and myalgias.  Skin: Negative for color change, pallor, rash, hair loss, nodules/bumps, skin tightness, ulcers and sensitivity to sunlight.  Allergic/Immunologic: Negative for susceptible to infections.  Neurological: Negative for dizziness, numbness and headaches.  Hematological: Negative for swollen glands.  Psychiatric/Behavioral: Positive for sleep disturbance. Negative for depressed mood. The patient is not nervous/anxious.     PMFS History:  Patient Active Problem List   Diagnosis Date Noted  . 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 ADD (attention deficit disorder) 01/27/2016  . Rosacea 01/27/2016  . Vitamin D deficiency 01/27/2016  . 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 Muscle weakness (generalized) 09/17/2013  . Scoliosis concern 09/17/2013  . Stiffness of joint, not elsewhere classified, pelvic region and thigh 09/17/2013  . Difficulty in walking(719.7) 09/17/2013    Past Medical History:  Diagnosis Date  . ADD (attention deficit disorder) 01/27/2016  . 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 Osteopenia   . Raynaud's disease   . 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 Rosacea 01/27/2016  . Varicose veins   .  Vitamin D deficiency 01/27/2016    Family History  Problem Relation Age of Onset  . Cancer Mother   . Cancer Father   . Heart attack Father   . Cancer Brother    Past Surgical History:  Procedure Laterality Date  . ABDOMINAL  HYSTERECTOMY    . CESAREAN SECTION    . CHOLECYSTECTOMY    . INTESTINAL BLOCKAGE    . TONSILLECTOMY     Social History   Social History Narrative  . Not on file    Objective: Vital Signs: BP (!) 150/72 (BP Location: Left Arm, Patient Position: Sitting, Cuff Size: Normal)   Pulse (!) 58   Resp 16   Ht 5\' 4"  (1.626 m)   Wt 146 lb (66.2 kg)   BMI 25.06 kg/m    Physical Exam  Constitutional: She is oriented to person, place, and time. She appears well-developed and well-nourished.  HENT:  Head: Normocephalic and atraumatic.  Eyes: Conjunctivae and EOM are normal.  Neck: Normal range of motion.  Cardiovascular: Normal rate, regular rhythm, normal heart sounds and intact distal pulses.  Pulmonary/Chest: Effort normal and breath sounds normal.  Abdominal: Soft. Bowel sounds are normal.  Lymphadenopathy:    She has no cervical adenopathy.  Neurological: She is alert and oriented to person, place, and time.  Skin: Skin is warm and dry. Capillary refill takes less than 2 seconds.  Psychiatric: She has a normal mood and affect. Her behavior is normal.  Nursing note and vitals reviewed.    Musculoskeletal Exam: C-spine limited ROM.  thoracic kyphosis. Left shoulder full ROM with discomfort.  Right shoulder full ROM.  Elbow joints, wrist joints, MCPs, PIPs, and DIPs good ROM with no synovitis.  Complete fist formation.  PIP and DIP synovial thickening.  CMC joint synovial thickening bilaterally.  Hip joints and knee joints good ROM. Right ankle limited ROM.  PIP and DIP synovial thickening consistent with osteoarthritis of both feet.   CDAI Exam: No CDAI exam completed.   Investigation: No additional findings.  Imaging: Xr Cervical Spine 2 Or 3 Views  Result Date: 08/20/2017 Mild spondylolisthesis at C5-C6. Anterior spurring noted. Facet joint arthropathy.    Recent Labs: Lab Results  Component Value Date   NA 141 06/21/2015   K 4.1 06/21/2015    Speciality Comments: No  specialty comments available.  Procedures:  Trigger Point Inj Date/Time: 08/20/2017 2:30 PM Performed by: Ofilia Neas, PA-C Authorized by: Ofilia Neas, PA-C   Consent Given by:  Patient Site marked: the procedure site was marked   Timeout: prior to procedure the correct patient, procedure, and site was verified   Indications:  Pain Total # of Trigger Points:  1 Location: neck   Needle Size:  27 G Approach:  Dorsal Medications #1:  10 mg triamcinolone acetonide 40 MG/ML; 0.5 mL lidocaine 1 % Patient tolerance:  Patient tolerated the procedure well with no immediate complications   Allergies: Codeine   Assessment / Plan:     Visit Diagnoses: Age-related osteoporosis without current pathological fracture - DEXA 05/28/17 T-score: -2.3 right femoral neck. 05/11/15 T score -2.5 in the femoral neck. hx of Fosamax use. Reclast #1 04/30/2016.  Due to the improvements revealed on her most recent DEXA scan she did not have a second Reclast infusion.  She has been taking calcium and vitamin D on a daily basis.  She has been going to physical therapy to work on resistive exercise as well as muscle strengthening.  Postural kyphosis  of thoracolumbar region: Chronic   Vitamin D deficiency: She has been taking a vitamin D supplement daily.   Primary osteoarthritis of both hands: She has PIP and DIP synovial thickening consistent with osteoarthritis of bilateral hands.  She has bilateral CMC joint synovial thickening.  She is complete fist formation bilaterally.  She has no discomfort in her hands at this time.  Joint protection and muscle strengthening were discussed.  Raynaud'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: She has very mild symptoms.  No digital ulcerations or signs of gangrene were noted.    DDD (degenerative disc disease), thoracic: AsNo midline spinal tenderness.  She has thoracic kyphosis.  Neck pain: She has been having increased neck pain.  She has limited range of motion with lateral  rotation.  She has tenderness in the left trapezius muscle of the she has been attending physical therapy for muscle strengthening and resistive exercises for management of osteoporosis.  We encouraged her to very light weight if she is going to be lifting weights. A x-ray of the C-spine was obtained today.  She previously had symptoms that were similar and had trigger point injections provided significant relief.  At the time she is taking Celebrex was also resolved her symptoms.  She is not having any symptoms of radiculopathy at this time.  She was given a handout of neck exercises that she can perform at home.  She requested a left trapezius trigger point injection today.  She tolerated the procedure well. She was given a prescription for zanaflex 4 mg at bedtime for muscle spasms.  She was advised to notify us if she develops new or worsening symptoms.  Plan: XR Cervical Spine 2 or 3 views   Other medical conditions are listed as follows:  History of attention deficit disorder  History of rosacea   Orders: Orders Placed This Encounter  Procedures  . Trigger Point Inj  . XR Cervical Spine 2 or 3 views   Meds ordered this encounter  Medications  . tiZANidine (ZANAFLEX) 4 MG tablet    Sig: Take 1 tablet (4 mg total) by mouth at bedtime as needed for muscle spasms.    Dispense:  30 tablet    Refill:  0    Face-to-face time spent with patient was 30 minutes. Greater than 50% of time was spent in counseling and coordination of care.  Follow-Up Instructions: Return in about 6 months (around 02/20/2018) for DDD, Osteoarthritis, Osteoporosis.   Ofilia Neas, PA-C  I examined and evaluated the patient with Hazel Sams PA. Experiencing a lot of neck discomfort which appears to be related to the neck exercises she has been doing.  C-spine x-rays were reviewed which are consistent with degenerative disc disease in the facet joint arthropathy.  We will hold off Reclast infusion this year. The  plan of care was discussed as noted above.  Bo Merino, MD  Note - This record has been created using Editor, commissioning.  Chart creation errors have been sought, but may not always  have been located. Such creation errors do not reflect on  the standard of medical care.

## 2017-08-20 NOTE — Patient Instructions (Signed)
Neck Exercises Neck exercises can be important for many reasons:  They can help you to improve and maintain flexibility in your neck. This can be especially important as you age.  They can help to make your neck stronger. This can make movement easier.  They can reduce or prevent neck pain.  They may help your upper back.  Ask your health care provider which neck exercises would be best for you. Exercises Neck Press Repeat this exercise 10 times. Do it first thing in the morning and right before bed or as told by your health care provider. 1. Lie on your back on a firm bed or on the floor with a pillow under your head. 2. Use your neck muscles to push your head down on the pillow and straighten your spine. 3. Hold the position as well as you can. Keep your head facing up and your chin tucked. 4. Slowly count to 5 while holding this position. 5. Relax for a few seconds. Then repeat.  Isometric Strengthening Do a full set of these exercises 2 times a day or as told by your health care provider. 1. Sit in a supportive chair and place your hand on your forehead. 2. Push forward with your head and neck while pushing back with your hand. Hold for 10 seconds. 3. Relax. Then repeat the exercise 3 times. 4. Next, do thesequence again, this time putting your hand against the back of your head. Use your head and neck to push backward against the hand pressure. 5. Finally, do the same exercise on either side of your head, pushing sideways against the pressure of your hand.  Prone Head Lifts Repeat this exercise 5 times. Do this 2 times a day or as told by your health care provider. 1. Lie face-down, resting on your elbows so that your chest and upper back are raised. 2. Start with your head facing downward, near your chest. Position your chin either on or near your chest. 3. Slowly lift your head upward. Lift until you are looking straight ahead. Then continue lifting your head as far back as  you can stretch. 4. Hold your head up for 5 seconds. Then slowly lower it to your starting position.  Supine Head Lifts Repeat this exercise 8-10 times. Do this 2 times a day or as told by your health care provider. 1. Lie on your back, bending your knees to point to the ceiling and keeping your feet flat on the floor. 2. Lift your head slowly off the floor, raising your chin toward your chest. 3. Hold for 5 seconds. 4. Relax and repeat.  Scapular Retraction Repeat this exercise 5 times. Do this 2 times a day or as told by your health care provider. 1. Stand with your arms at your sides. Look straight ahead. 2. Slowly pull both shoulders backward and downward until you feel a stretch between your shoulder blades in your upper back. 3. Hold for 10-30 seconds. 4. Relax and repeat.  Contact a health care provider if:  Your neck pain or discomfort gets much worse when you do an exercise.  Your neck pain or discomfort does not improve within 2 hours after you exercise. If you have any of these problems, stop exercising right away. Do not do the exercises again unless your health care provider says that you can. Get help right away if:  You develop sudden, severe neck pain. If this happens, stop exercising right away. Do not do the exercises again unless your   health care provider says that you can. Exercises Neck Stretch  Repeat this exercise 3-5 times. 1. Do this exercise while standing or while sitting in a chair. 2. Place your feet flat on the floor, shoulder-width apart. 3. Slowly turn your head to the right. Turn it all the way to the right so you can look over your right shoulder. Do not tilt or tip your head. 4. Hold this position for 10-30 seconds. 5. Slowly turn your head to the left, to look over your left shoulder. 6. Hold this position for 10-30 seconds.  Neck Retraction Repeat this exercise 8-10 times. Do this 3-4 times a day or as told by your health care  provider. 1. Do this exercise while standing or while sitting in a sturdy chair. 2. Look straight ahead. Do not bend your neck. 3. Use your fingers to push your chin backward. Do not bend your neck for this movement. Continue to face straight ahead. If you are doing the exercise properly, you will feel a slight sensation in your throat and a stretch at the back of your neck. 4. Hold the stretch for 1-2 seconds. Relax and repeat.  This information is not intended to replace advice given to you by your health care provider. Make sure you discuss any questions you have with your health care provider. Document Released: 12/13/2014 Document Revised: 06/09/2015 Document Reviewed: 07/12/2014 Elsevier Interactive Patient Education  2018 [employer]  

## 2017-09-26 ENCOUNTER — Ambulatory Visit: Payer: Medicare HMO | Admitting: Physician Assistant

## 2017-10-29 DIAGNOSIS — Z23 Encounter for immunization: Secondary | ICD-10-CM | POA: Diagnosis not present

## 2017-11-08 ENCOUNTER — Telehealth: Payer: Self-pay | Admitting: Rheumatology

## 2017-11-08 DIAGNOSIS — M542 Cervicalgia: Secondary | ICD-10-CM

## 2017-11-08 NOTE — Addendum Note (Signed)
Addended by: Carole Binning on: 11/08/2017 03:17 PM   Modules accepted: Orders

## 2017-11-08 NOTE — Telephone Encounter (Signed)
Patient may try physical therapy or she can schedule an appointment for trigger point injections.

## 2017-11-08 NOTE — Telephone Encounter (Signed)
Attempted to contact patient and left message for patient to call the office.  

## 2017-11-08 NOTE — Telephone Encounter (Signed)
Patient called stating she is having pain in her neck again and is not sure if she should get another injection or if there is something else she can take.  Patient states she has taken muscle relaxers in the past, but doesn't feel like that helped.  Patient requested a return call.

## 2017-11-08 NOTE — Telephone Encounter (Signed)
Patient states she is having pain in her neck that has been going on for about 1 week. Patient states she does not recall injuring her neck. Patient states when she was in the office in August she had an x-ray which showed arthritis. Patient states she had an injection at that time and it gave her relief. Patient states she has been doing the neck exercises. Patient states she has been taking muscle relaxers but not give much relief. Patient would like to know what else she may try for the neck pain. Please advise.

## 2017-11-11 ENCOUNTER — Encounter: Payer: Self-pay | Admitting: Physician Assistant

## 2017-11-11 ENCOUNTER — Ambulatory Visit: Payer: Medicare HMO | Admitting: Physician Assistant

## 2017-11-11 VITALS — BP 159/90 | HR 59 | Resp 12 | Ht 64.0 in | Wt 139.4 lb

## 2017-11-11 DIAGNOSIS — M62838 Other muscle spasm: Secondary | ICD-10-CM | POA: Diagnosis not present

## 2017-11-11 DIAGNOSIS — I73 Raynaud'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: Secondary | ICD-10-CM

## 2017-11-11 DIAGNOSIS — M810 Age-related osteoporosis without current pathological fracture: Secondary | ICD-10-CM | POA: Diagnosis not present

## 2017-11-11 DIAGNOSIS — Z8659 Personal history of other mental and behavioral disorders: Secondary | ICD-10-CM

## 2017-11-11 DIAGNOSIS — M19042 Primary osteoarthritis, left hand: Secondary | ICD-10-CM

## 2017-11-11 DIAGNOSIS — Z872 Personal history of diseases of the skin and subcutaneous tissue: Secondary | ICD-10-CM

## 2017-11-11 DIAGNOSIS — M4005 Postural kyphosis, thoracolumbar region: Secondary | ICD-10-CM

## 2017-11-11 DIAGNOSIS — M19041 Primary osteoarthritis, right hand: Secondary | ICD-10-CM

## 2017-11-11 DIAGNOSIS — M5134 Other intervertebral disc degeneration, thoracic region: Secondary | ICD-10-CM

## 2017-11-11 DIAGNOSIS — M47819 Spondylosis without myelopathy or radiculopathy, site unspecified: Secondary | ICD-10-CM | POA: Diagnosis not present

## 2017-11-11 DIAGNOSIS — R69 Illness, unspecified: Secondary | ICD-10-CM | POA: Diagnosis not present

## 2017-11-11 DIAGNOSIS — E559 Vitamin D deficiency, unspecified: Secondary | ICD-10-CM | POA: Diagnosis not present

## 2017-11-11 MED ORDER — METHOCARBAMOL 500 MG PO TABS: 500.0000 mg | ORAL_TABLET | Freq: Every day | ORAL | 1 refills | Status: DC | PRN

## 2017-11-11 MED ORDER — LIDOCAINE HCL 1 % IJ SOLN
0.5000 mL | INTRAMUSCULAR | Status: AC | PRN
  Administered 2017-11-11: .5 mL

## 2017-11-11 MED ORDER — TRIAMCINOLONE ACETONIDE 40 MG/ML IJ SUSP
10.0000 mg | INTRAMUSCULAR | Status: AC | PRN
  Administered 2017-11-11: 10 mg via INTRAMUSCULAR

## 2017-11-11 NOTE — Progress Notes (Signed)
Office Visit Note  Patient: Lauren Knox             Date of Birth: 07/14/1945           MRN: [id number]             PCP: Asencion Noble, MD Referring: Asencion Noble, MD Visit Date: 11/11/2017 Occupation: @GUAROCC @  Subjective:  Neck pain   History of Present Illness: Lauren Knox is a 72 y.o. female with history of osteoporosis, DDD, osteoarthritis, and Raynaud's syndrome.  She reports over the past several weeks she has been having increased neck pain and left sided radiculopathy.  She states she has been having severe daytime and nocturnal pain.  She denies any injuries or an event that could have exacerbated the neck pain.  She has trigger point injections on 08/20/17 that provided temporary relief. She has been experiencing trapezius muscle tenderness.  She has been working on ROM exercises.  She has been taking Advil for pain relief as well as Zanaflex 4 mg by mouth PRN for muscle spasms.  She denies any other joint pain or joint swelling at this time.  She continues to have intermittent symptoms of Raynaud's.  She has been walking for exercise.    Activities of Daily Living:  Patient reports morning stiffness   all day.   Patient Reports nocturnal pain.  Difficulty dressing/grooming: Denies Difficulty climbing stairs: Denies Difficulty getting out of chair: Denies Difficulty using hands for taps, buttons, cutlery, and/or writing: Denies  Review of Systems  Constitutional: Positive for fatigue.  HENT: Positive for mouth dryness. Negative for mouth sores and nose dryness.   Eyes: Positive for dryness. Negative for pain and visual disturbance.  Respiratory: Positive for cough (Dry cough). Negative for hemoptysis, shortness of breath and difficulty breathing.   Cardiovascular: Negative for chest pain, palpitations, hypertension and swelling in legs/feet.  Gastrointestinal: Negative for blood in stool, constipation and diarrhea.  Endocrine: Negative for increased urination.    Genitourinary: Negative for painful urination.  Musculoskeletal: Positive for arthralgias, joint pain, myalgias, morning stiffness, muscle tenderness and myalgias. Negative for joint swelling and muscle weakness.  Skin: Negative for color change, pallor, rash, hair loss, nodules/bumps, skin tightness, ulcers and sensitivity to sunlight.  Allergic/Immunologic: Negative for susceptible to infections.  Neurological: Negative for dizziness, numbness, headaches and weakness.  Hematological: Negative for swollen glands.  Psychiatric/Behavioral: Positive for sleep disturbance. Negative for depressed mood. The patient is not nervous/anxious.     PMFS History:  Patient Active Problem List   Diagnosis Date Noted  . 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 ADD (attention deficit disorder) 01/27/2016  . Rosacea 01/27/2016  . Vitamin D deficiency 01/27/2016  . 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 Muscle weakness (generalized) 09/17/2013  . Scoliosis concern 09/17/2013  . Stiffness of joint, not elsewhere classified, pelvic region and thigh 09/17/2013  . Difficulty in walking(719.7) 09/17/2013    Past Medical History:  Diagnosis Date  . ADD (attention deficit disorder) 01/27/2016  . 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 Osteopenia   . Raynaud's disease   . 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 Rosacea 01/27/2016  . Varicose veins   . Vitamin D deficiency 01/27/2016    Family History  Problem Relation Age of Onset  . Cancer Mother   .  Cancer Father   . Heart attack Father   . Cancer Brother    Past Surgical History:  Procedure Laterality Date  . ABDOMINAL HYSTERECTOMY    . CESAREAN SECTION    . CHOLECYSTECTOMY    . INTESTINAL BLOCKAGE    .  TONSILLECTOMY     Social History   Social History Narrative  . Not on file    Objective: Vital Signs: BP (!) 159/90 (BP Location: Right Arm, Patient Position: Sitting, Cuff Size: Normal)   Pulse (!) 59   Resp 12   Ht 5\' 4"  (1.626 m)   Wt 139 lb 6.4 oz (63.2 kg)   BMI 23.93 kg/m    Physical Exam  Constitutional: She is oriented to person, place, and time. She appears well-developed and well-nourished.  HENT:  Head: Normocephalic and atraumatic.  Eyes: Conjunctivae and EOM are normal.  Neck: Normal range of motion.  Cardiovascular: Normal rate, regular rhythm, normal heart sounds and intact distal pulses.  Pulmonary/Chest: Effort normal and breath sounds normal.  Abdominal: Soft. Bowel sounds are normal.  Lymphadenopathy:    She has no cervical adenopathy.  Neurological: She is alert and oriented to person, place, and time.  Skin: Skin is warm and dry. Capillary refill takes less than 2 seconds.  Psychiatric: She has a normal mood and affect. Her behavior is normal.  Nursing note and vitals reviewed.    Musculoskeletal Exam: C-spine limited range of motion.  Trapezius muscle tension muscle tenderness bilaterally.  Thoracic lumbar spine good range of motion.  No midline spinal tenderness.  No SI joint tenderness.  Shoulder joints, elbow joints, wrist joints, MCPs, PIPs, DIPs good range of motion no synovitis.  She has PIP and DIP synovial thickening consistent with osteoarthritis of bilateral hands.  She has bilateral CMC joint synovial thickening.  Hip joints, knee joints, ankle joints, MTPs, PIPs, DIPs good range of motion no synovitis.  No warmth or effusion bilateral knee joints.  Right ankle joint swelling from previous injury.  Left ankle full range of motion with no discomfort.  CDAI Exam: CDAI Score: Not documented Patient Global Assessment: Not documented; Provider Global Assessment: Not documented Swollen: Not documented; Tender: Not documented Joint Exam   Not  documented   There is currently no information documented on the homunculus. Go to the Rheumatology activity and complete the homunculus joint exam.  Investigation: No additional findings.  Imaging: No results found.  Recent Labs: Lab Results  Component Value Date   NA 141 06/21/2015   K 4.1 06/21/2015    Speciality Comments: No specialty comments available.  Procedures:  Trigger Point Inj Date/Time: 11/11/2017 11:37 AM Performed by: Ofilia Neas, PA-C Authorized by: Ofilia Neas, PA-C   Consent Given by:  Patient Site marked: the procedure site was marked   Timeout: prior to procedure the correct patient, procedure, and site was verified   Indications:  Pain Total # of Trigger Points:  2 Location: neck   Needle Size:  27 G Approach:  Dorsal Medications #1:  0.5 mL lidocaine 1 %; 10 mg triamcinolone acetonide 40 MG/ML Medications #2:  0.5 mL lidocaine 1 %; 10 mg triamcinolone acetonide 40 MG/ML Patient tolerance:  Patient tolerated the procedure well with no immediate complications   Allergies: Codeine   Assessment / Plan:     Visit Diagnoses: Age-related osteoporosis without current pathological fracture - DEXA 05/28/17 T-score: -2.3 right femoral neck. 05/11/15 T score -2.5 in the femoral neck. hx of Fosamax use. Reclast #1  04/30/2016.  Her bone density improved so she did not proceed with another Reclast infusion.  She continues to take calcium and vitamin D on a daily basis.  She attended physical therapy in the past to work on resistive exercises.  She continues to walk 2 miles on a daily basis.   Postural kyphosis of thoracolumbar region - No midline spinal tenderness. She attended PT for resistive exercises in the past.  Vitamin D deficiency: She continues to take a vitamin D supplement on a daily basis.   Trapezius muscle spasm -She has trapezius muscle tension and muscle tenderness bilaterally.  She has been having worsening neck pain and stiffness for the  past several weeks. She has been experiencing left sided radiculopathy.  She had trigger point cortisone injections on 08/20/17 that provided temporary relief.  She requested bilateral trigger point injections today.  She tolerated the procedure well.  she was advised to monitor her blood pressure closely following the procedure.  We discussed the risks of an elevation in blood pressure but she requested to proceed with the injections.  She has been taking Zanaflex 4 mg by mouth PRN for muscle spasms, which she feels is only effective for about 2 hours.  She requested a prescription for a muscle relaxer that she can take during the day.  A prescription for Robaxin 500 mg 1 tablet by mouth PRN for muscle spasms was sent to the pharmacy.  She was encouraged to continue using a heating pad and working on neck ROM exercises.  She will be starting physical therapy soon.  A MRI of the neck will be ordered today.   Arthropathy of facet joint -Cervical spine-She has been having worsening neck pain, stiffness, and left sided radiculopathy for the past several weeks. She has been experiencing daytime and nocturnal pain.  XR of the C-spine on 08/20/17 revealed mild spondylolisthesis at C5-C6 and anterior spurring noted.  She also had a facet joint arthropathy. She has been working on neck ROM exercises.  She has also tried taking Zanaflex 4 mg by mouth PRN for muscle spasms.  She is having trapezius muscle tension and spasms.  Bilateral trigger point cortisone injections were performed today.  She was also given a prescription for Robaxin 500 mg 1 tablet by mouth PRN for muscle spasms.  She will starting PT for neck exercises.  She requested a MRI of the Cervical spine as well.  She was advised to notify us if she develops new or worsening symptoms.  Plan: MR CERVICAL SPINE WO CONTRAST   Primary osteoarthritis of both hands: She has PIP and DIP synovial thickening consistent with osteoarthritis of both hands.  Bilateral CMC  joint synovial thickening.  Joint protection and muscle strengthening were discussed.   Raynaud'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: She continues to have intermittent symptoms of Raynaud's.  No digital ulcerations or signs of gangrene noted.   DDD (degenerative disc disease), thoracic: No midline spinal tenderness.    Other medical conditions are listed as follows:   History of attention deficit disorder  History of rosacea   Orders: Orders Placed This Encounter  Procedures  . Trigger Point Inj  . MR CERVICAL SPINE WO CONTRAST   Meds ordered this encounter  Medications  . methocarbamol (ROBAXIN) 500 MG tablet    Sig: Take 1 tablet (500 mg total) by mouth daily as needed for muscle spasms.    Dispense:  30 tablet    Refill:  1    Face-to-face time spent with patient  was 30 minutes. Greater than 50% of time was spent in counseling and coordination of care.  Follow-Up Instructions: Return for Osteoporosis, Osteoarthritis, DDD.   Ofilia Neas, PA-C  Note - This record has been created using Dragon software.  Chart creation errors have been sought, but may not always  have been located. Such creation errors do not reflect on  the standard of medical care.

## 2017-11-19 ENCOUNTER — Ambulatory Visit (HOSPITAL_COMMUNITY)
Admission: RE | Admit: 2017-11-19 | Discharge: 2017-11-19 | Disposition: A | Discharge status: Home / Self Care | Payer: Medicare HMO | Source: Ambulatory Visit | Attending: Physician Assistant | Admitting: Physician Assistant

## 2017-11-19 DIAGNOSIS — M4802 Spinal stenosis, cervical region: Secondary | ICD-10-CM | POA: Diagnosis not present

## 2017-11-19 DIAGNOSIS — M542 Cervicalgia: Secondary | ICD-10-CM | POA: Diagnosis not present

## 2017-11-19 DIAGNOSIS — M47819 Spondylosis without myelopathy or radiculopathy, site unspecified: Secondary | ICD-10-CM | POA: Insufficient documentation

## 2017-11-20 DIAGNOSIS — I10 Essential (primary) hypertension: Secondary | ICD-10-CM | POA: Diagnosis not present

## 2017-11-20 DIAGNOSIS — R05 Cough: Secondary | ICD-10-CM | POA: Diagnosis not present

## 2017-11-20 NOTE — Progress Notes (Signed)
MRI of the C-spine was reviewed with Dr. Estanislado Pandy. She has generalized facet degeneration and DDD.  No canal stenosis noted. 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  Please ask patient if she would like a referral to a spine specialist if she is still symptomatic.

## 2017-11-21 ENCOUNTER — Telehealth: Payer: Self-pay | Admitting: Rheumatology

## 2017-11-21 DIAGNOSIS — M5030 Other cervical disc degeneration, unspecified cervical region: Secondary | ICD-10-CM

## 2017-11-21 DIAGNOSIS — M542 Cervicalgia: Secondary | ICD-10-CM

## 2017-11-21 MED ORDER — TIZANIDINE HCL 4 MG PO TABS: 4.0000 mg | ORAL_TABLET | Freq: Every evening | ORAL | 0 refills | Status: DC | PRN

## 2017-11-21 NOTE — Telephone Encounter (Signed)
Patient called stating she had her MRI on Tuesday, 11/5 and is requesting a return call with the results.  Patient also states that she doesn't start physical therapy until 11/13 and is requesting a refill of Tizanidine to be sent to [facility].

## 2017-11-21 NOTE — Telephone Encounter (Signed)
Patient advised MRI of the C-spine was reviewed with Dr. Estanislado Pandy. She has generalized facet degeneration and DDD.  No canal stenosis noted. 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 Referral placed for spine specialist.

## 2017-11-21 NOTE — Telephone Encounter (Signed)
Last Visit: 11/11/17 Next Visit: 02/20/18  Okay to refill per Dr. Estanislado Pandy

## 2017-11-21 NOTE — Telephone Encounter (Signed)
-----   Message from Ofilia Neas, PA-C sent at 11/20/2017  4:25 PM EST ----- MRI of the C-spine was reviewed with Dr. Estanislado Pandy. She has generalized facet degeneration and DDD.  No canal stenosis noted. 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  Please ask patient if she would like a referral to a spine specialist if  she is still symptomatic.

## 2017-11-27 ENCOUNTER — Ambulatory Visit (HOSPITAL_COMMUNITY): Payer: Medicare HMO | Attending: Internal Medicine

## 2017-11-27 ENCOUNTER — Other Ambulatory Visit: Payer: Self-pay

## 2017-11-27 DIAGNOSIS — R29898 Other symptoms and signs involving the musculoskeletal system: Secondary | ICD-10-CM | POA: Insufficient documentation

## 2017-11-27 DIAGNOSIS — M6281 Muscle weakness (generalized): Secondary | ICD-10-CM | POA: Diagnosis not present

## 2017-11-27 DIAGNOSIS — M542 Cervicalgia: Secondary | ICD-10-CM | POA: Insufficient documentation

## 2017-11-27 DIAGNOSIS — R293 Abnormal posture: Secondary | ICD-10-CM | POA: Diagnosis not present

## 2017-11-27 NOTE — Therapy (Signed)
[place], [place], [address] Phone: [phone]   Fax:  [phone]  Physical Therapy Evaluation  Patient Details  Name: Lauren Knox MRN: [id number] Date of Birth: 1945/12/24 Referring Provider (PT): Bo Merino, MD   Encounter Date: 11/27/2017  PT End of Session - 11/27/17 1827    Visit Number  1    Number of Visits  8    Date for PT Re-Evaluation  01/01/18   Patient will be out of town 11/18-23/2019   Authorization Type  [employer] medicare 25.00 co-pay. copay go towards oop which is 2000.00 met 170.00 no deduct visit limit is med ness OT PT & SPT no auth needed.Follow medicare guidelines     Authorization Time Period  cert 28/76/8115 - 72/62/0355    Authorization - Visit Number  1    Authorization - Number of Visits  8    PT Start Time  1301    PT Stop Time  1346    PT Time Calculation (min)  45 min    Activity Tolerance  Patient tolerated treatment well    Behavior During Therapy  WFL for tasks assessed/performed       Past Medical History:  Diagnosis Date  . ADD (attention deficit disorder) 01/27/2016  . 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 Osteopenia   . Raynaud's disease   . 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 Rosacea 01/27/2016  . Varicose veins   . Vitamin D deficiency 01/27/2016    Past Surgical History:  Procedure Laterality Date  . ABDOMINAL HYSTERECTOMY    . CESAREAN SECTION    . CHOLECYSTECTOMY    . INTESTINAL BLOCKAGE    . TONSILLECTOMY      There were no vitals filed for this visit.   Subjective Assessment - 11/27/17 1253    Subjective  Pain in back of neck and down left arm started about 4-5 weeks ago. Some numbness/tingling in upper arm. Recieved an injection in Cspine from Dr Estanislado Pandy about 1-2 weeks ago. Pain in neck and down arm have significantly improved since the  injection but stilll has pain on left side of upper shoulder blade area and some up into head. Has difficult with head movementsl Feels lie pain is getting better and better every day but wants to know how to not allow problem to progress in the future. It was painful to eat and sleep in any position. This is better now after the injection now.     Pertinent History  Raynaud's syndrome, kyphosis, ADD, Vitamin D deficiency, osteopenia    Limitations  Other (comment)   activities involving movement of head use up and down, side to side, reading a book   Diagnostic tests  MRI Results DDD C- spine, mild to moderate foraminal stenosis, generalized facet degeneration    Patient Stated Goals  Learn how to keep neck pain and joints from progressing in the future.    Currently in Pain?  Yes    Pain Score  5     Pain Location  Neck    Pain Orientation  Posterior;Left    Pain Descriptors / Indicators  Shooting;Sharp;Tightness    Pain Type  Acute pain    Pain Radiating Towards  left upper lateral arm    Pain Onset  More than a month ago    Pain Frequency  Intermittent    Aggravating Factors  looking up/down, side to side, movement    Pain Relieving Factors  heat pad, Advil, muscle relaxer, injection, ice packs, hot shower    Effect of Pain on Daily Activities  limits         [facility] PT Assessment - 11/27/17 0001      Assessment   Medical Diagnosis   neck pain, DDD cervical     Referring Provider (PT)  Bo Merino, MD    Onset Date/Surgical Date  11/21/17    Hand Dominance  Right    Next MD Visit  04/2018; neurosurgery to be scheduled    Prior Therapy  yes - 1 visit 10-15 years ago      Precautions   Precautions  None      Restrictions   Weight Bearing Restrictions  No      Balance Screen   Has the patient fallen in the past 6 months  Yes    How many times?  1    Has the patient had a decrease in activity level because of a fear of falling?   No    Is the patient reluctant to leave  their home because of a fear of falling?   No      Prior Function   Level of Independence  Independent    Vocation  Retired    Leisure  gardening, going for walks, TV, books, painting, cooking      Cognition   Overall Cognitive Status  Within Functional Limits for tasks assessed      Observation/Other Assessments   Focus on Therapeutic Outcomes (FOTO)   57% limited      Posture/Postural Control   Posture/Postural Control  Postural limitations    Postural Limitations  Rounded Shoulders;Forward head;Increased thoracic kyphosis    Posture Comments  cervical spine hyperextension at rest      ROM / Strength   AROM / PROM / Strength  AROM;PROM;Strength      AROM   AROM Assessment Site  Cervical    Cervical Flexion  25% limited    Cervical Extension  WNL    Cervical - Right Side Bend  75% limited    Cervical - Left Side Bend  75% limited    Cervical - Right Rotation  50% limited    Cervical - Left Rotation  50% limited      PROM   PROM Assessment Site  Cervical    Cervical Flexion  WNL    Cervical Extension  WNL    Cervical - Right Side Bend  25% limited    Cervical - Left Side Bend  25% limited    Cervical - Right Rotation  25% limited    Cervical - Left Rotation  25% limited      Strength   Strength Assessment Site  Shoulder;Elbow;Hand;Cervical    Right/Left Shoulder  Right;Left    Right Shoulder Flexion  4/5   left sided symptoms   Right Shoulder Extension  4/5    Right Shoulder ABduction  4/5   left sided symptoms   Right Shoulder Internal Rotation  4/5    Right Shoulder External Rotation  4-/5    Left Shoulder Flexion  4/5    Left Shoulder Extension  4/5    Left Shoulder ABduction  4-/5    Left Shoulder Internal Rotation  4/5    Left Shoulder External Rotation  4-/5    Right/Left Elbow  Right;Left    Right Elbow Flexion  4/5   left  sided symptoms   Right Elbow Extension  4-/5    Left Elbow Flexion  4/5   left sided symptoms   Left Elbow Extension  4-/5     Right/Left hand  Right;Left    Cervical Flexion  3-/5   immediate hyperextension compensation   Cervical Extension  4/5    Cervical - Right Side Bend  4/5    Cervical - Left Side Bend  4/5    Cervical - Right Rotation  4/5    Cervical - Left Rotation  4/5                Objective measurements completed on examination: See above findings.              PT Education - 11/27/17 1325    Education Details  Examination findings and plan of care, initial HEP.    Person(s) Educated  Patient    Methods  Explanation;Demonstration;Handout    Comprehension  Verbalized understanding;Verbal cues required;Tactile cues required;Need further instruction       PT Short Term Goals - 11/27/17 2008      PT SHORT TERM GOAL #1   Title  Patient will be independent in initial HEP and perform on a regular basis to more quickly improve symptoms than with PT alone.     Time  3    Period  Weeks    Status  New    Target Date  12/18/17   Patient will be out of town 11/18-23/2019.     PT SHORT TERM GOAL #2   Title  Patient will exhibit 25% limitation in cervical spine AROM or better to indicate improved functional ability.    Baseline  see cervical spine AROM    Time  3    Period  Weeks    Status  New        PT Long Term Goals - 11/27/17 2011      PT LONG TERM GOAL #1   Title  Patient will be independent in advanced  HEP and perform on a regular basis to continue improving pain and function after discharge from outpatient physical therapy.     Time  5    Period  Weeks    Status  New    Target Date  01/01/18   Patient will be out of town 11/18-23/2019     PT LONG TERM GOAL #2   Title  Patient will exhibit improved postural awareness independently to decrease joint and muscle stresses over time and improve patient functional ability over time.    Baseline  initial - cervical hyperextension, forward head rounded shoulders, thoracic kyphosis    Time  5    Period  Weeks     Status  New      PT LONG TERM GOAL #3   Title  Patient will report no greater than 2/10 pain over a 1 week period prior to re-evaluation to indicate improved pain and improved functional abliity.     Baseline  initial 5/10    Time  5    Period  Weeks    Status  New      PT LONG TERM GOAL #4   Title  Patient will exhibit a 10% or > improvement in FOTO limitation score to indicate improved perceived functional abilities.     Baseline  initial - FOTO 57% limitation    Time  5    Period  Weeks    Status  New  Plan - 11/27/17 1830    Clinical Impression Statement  Patient is a 72 year old female presenting to outpatient physical therapy with complaints of neck and left arm pain and MRI Results - DDD C- spine, mild to moderate foraminal stenosis, generalized facet degeneration. Patient did receive an injection in her cervical spine which has reduced her pain and her pain continues to improve. However, patient continues to exhibit pain complaints, postural impairments, reduced AROM and PROM of the cervical spine, cervical spine and bilateral upper extremity strength deficits and functional impairments. Patient would benefit from skilled physical therapy to address the aforementioned deficits, instruct her in a home exercise program and improved her QOL.     History and Personal Factors relevant to plan of care:  Raynaud's syndrome, kyphosis, ADD, Vitamin D deficiency, osteopenia    Clinical Presentation  Stable    Clinical Presentation due to:  MMT, AROM, PROM, pain, posture, FOTO, clinical judgement    Clinical Decision Making  Low    Rehab Potential  Good    Clinical Impairments Affecting Rehab Potential  positive - positive response to injection; negative - MRI results, postural impairments, osteopenia    PT Frequency  2x / week    PT Duration  --   5 weeks due to patient being out of town 11/18 - 12/07/2017   PT Treatment/Interventions  Traction;Iontophoresis 22m/ml  Dexamethasone;Therapeutic activities;Therapeutic exercise;Neuromuscular re-education;Patient/family education;Manual techniques;Passive range of motion;Dry needling;Energy conservation;Taping;Joint Manipulations;Spinal Manipulations    PT Next Visit Plan  review goals, eval and initial HEP; manual therapy for STM to posterior cervical and periscapular muscles, PROM C-spine, assess T-spine mobility, periscapular and C-spine strengthening, postural education, advance HEP as able. PRECAUTION - osteopenia    PT Home Exercise Plan  initial - supine axial extension/chin tuck, C-spine AAROM rotation on mylar ball    Consulted and Agree with Plan of Care  Patient       Patient will benefit from skilled therapeutic intervention in order to improve the following deficits and impairments:  Pain, Impaired sensation, Impaired UE functional use, Decreased strength, Decreased range of motion, Decreased activity tolerance, Postural dysfunction  Visit Diagnosis: Cervicalgia  Muscle weakness (generalized)  Abnormal posture  Other symptoms and signs involving the musculoskeletal system     Problem List Patient Active Problem List   Diagnosis Date Noted  . 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 ADD (attention deficit disorder) 01/27/2016  . Rosacea 01/27/2016  . Vitamin D deficiency 01/27/2016  . 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 Muscle weakness (generalized) 09/17/2013  . Scoliosis concern 09/17/2013  . Stiffness of joint, not elsewhere classified, pelvic region and thigh 09/17/2013  . Difficulty in walking(719.7) 09/17/2013    Lauren Raveling Hartnett-Rands, MS, PT Per [id number]/13/2019, 8:16 PM  [address] [address]Phone: [phone]  Fax:   [phone] Name: MMICHAELA BROSKIMRN: [id number] of Birth: 711-Oct-1947

## 2017-11-27 NOTE — Patient Instructions (Signed)
Axial Extension (Chin Tuck)    Lying on backPull chin in and lengthen back of neck. Hold _2-3___ seconds while counting out loud. Repeat 10____ times. Do _1___ sessions per day.  http://gt2.exer.us/450   Copyright  VHI. All rights reserved.   Try ti find mylar ball to do neck rotation lying down. 5 second holds to left and right 10 times.

## 2017-11-28 DIAGNOSIS — D225 Melanocytic nevi of trunk: Secondary | ICD-10-CM | POA: Diagnosis not present

## 2017-11-28 DIAGNOSIS — D2272 Melanocytic nevi of left lower limb, including hip: Secondary | ICD-10-CM | POA: Diagnosis not present

## 2017-11-28 DIAGNOSIS — Z23 Encounter for immunization: Secondary | ICD-10-CM | POA: Diagnosis not present

## 2017-11-28 DIAGNOSIS — L814 Other melanin hyperpigmentation: Secondary | ICD-10-CM | POA: Diagnosis not present

## 2017-11-28 DIAGNOSIS — Z85828 Personal history of other malignant neoplasm of skin: Secondary | ICD-10-CM | POA: Diagnosis not present

## 2017-11-28 DIAGNOSIS — L720 Epidermal cyst: Secondary | ICD-10-CM | POA: Diagnosis not present

## 2017-11-28 DIAGNOSIS — L821 Other seborrheic keratosis: Secondary | ICD-10-CM | POA: Diagnosis not present

## 2017-12-17 ENCOUNTER — Encounter (HOSPITAL_COMMUNITY): Payer: Medicare HMO | Admitting: Physical Therapy

## 2017-12-17 DIAGNOSIS — F341 Dysthymic disorder: Secondary | ICD-10-CM | POA: Diagnosis not present

## 2017-12-17 DIAGNOSIS — I10 Essential (primary) hypertension: Secondary | ICD-10-CM | POA: Diagnosis not present

## 2017-12-17 DIAGNOSIS — M5030 Other cervical disc degeneration, unspecified cervical region: Secondary | ICD-10-CM | POA: Diagnosis not present

## 2017-12-17 DIAGNOSIS — M542 Cervicalgia: Secondary | ICD-10-CM | POA: Diagnosis not present

## 2017-12-17 DIAGNOSIS — R69 Illness, unspecified: Secondary | ICD-10-CM | POA: Diagnosis not present

## 2017-12-17 DIAGNOSIS — M4722 Other spondylosis with radiculopathy, cervical region: Secondary | ICD-10-CM | POA: Diagnosis not present

## 2017-12-18 ENCOUNTER — Encounter (HOSPITAL_COMMUNITY): Payer: Medicare HMO

## 2017-12-19 ENCOUNTER — Ambulatory Visit (HOSPITAL_COMMUNITY): Payer: Medicare HMO | Attending: Internal Medicine | Admitting: Physical Therapy

## 2017-12-19 DIAGNOSIS — M542 Cervicalgia: Secondary | ICD-10-CM

## 2017-12-19 DIAGNOSIS — R293 Abnormal posture: Secondary | ICD-10-CM | POA: Diagnosis not present

## 2017-12-19 DIAGNOSIS — M6281 Muscle weakness (generalized): Secondary | ICD-10-CM | POA: Insufficient documentation

## 2017-12-19 DIAGNOSIS — R29898 Other symptoms and signs involving the musculoskeletal system: Secondary | ICD-10-CM | POA: Diagnosis not present

## 2017-12-19 NOTE — Therapy (Signed)
[place], [place], [address] Phone: [phone]   Fax:  [phone]  Physical Therapy Treatment  Patient Details  Name: Lauren Knox MRN: [id number] Date of Birth: 10-29-1945 Referring Provider (PT): Bo Merino, MD   Encounter Date: 12/19/2017  PT End of Session - 12/19/17 1515    Visit Number  2    Number of Visits  8    Date for PT Re-Evaluation  01/01/18   Patient will be out of town 11/18-23/2019   Authorization Type  [employer] medicare 25.00 co-pay. copay go towards oop which is 2000.00 met 170.00 no deduct visit limit is med ness OT PT & SPT no auth needed.Follow medicare guidelines     Authorization Time Period  cert 34/74/2595 - 63/87/5643    Authorization - Visit Number  2    Authorization - Number of Visits  8    PT Start Time  3295    PT Stop Time  1424    PT Time Calculation (min)  39 min    Activity Tolerance  Patient tolerated treatment well    Behavior During Therapy  WFL for tasks assessed/performed       Past Medical History:  Diagnosis Date  . ADD (attention deficit disorder) 01/27/2016  . 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 Osteopenia   . Raynaud's disease   . 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 Rosacea 01/27/2016  . Varicose veins   . Vitamin D deficiency 01/27/2016    Past Surgical History:  Procedure Laterality Date  . ABDOMINAL HYSTERECTOMY    . CESAREAN SECTION    . CHOLECYSTECTOMY    . INTESTINAL BLOCKAGE    . TONSILLECTOMY      There were no vitals filed for this visit.  Subjective Assessment - 12/19/17 1352    Subjective  pt states her "flare up" is now gone and she's feeling better.  No pain at all.  States "when it goes, it goes".      Currently in Pain?  No/denies                       [facility] Adult PT Treatment/Exercise - 12/19/17 0001       Exercises   Exercises  Neck      Neck Exercises: Seated   Neck Retraction  10 reps    W Back  10 reps    Other Seated Exercise  cervical excursions 5 reps each      Manual Therapy   Manual Therapy  Soft tissue mobilization    Manual therapy comments  completed seperatetly from all other interventions    Soft tissue mobilization  to bilateral upper traps in seated             PT Education - 12/19/17 1520    Education Details  review of goals and HEP.  Answered all questions regarding arthritis, exercises and currentl symptoms.     Person(s) Educated  Patient    Methods  Explanation    Comprehension  Verbalized understanding;Need further instruction       PT Short Term Goals - 11/27/17 2008      PT SHORT TERM GOAL #1   Title  Patient will be independent in initial HEP and perform on a regular basis to more quickly improve symptoms than with PT alone.     Time  3  Period  Weeks    Status  New    Target Date  12/18/17   Patient will be out of town 11/18-23/2019.     PT SHORT TERM GOAL #2   Title  Patient will exhibit 25% limitation in cervical spine AROM or better to indicate improved functional ability.    Baseline  see cervical spine AROM    Time  3    Period  Weeks    Status  New        PT Long Term Goals - 11/27/17 2011      PT LONG TERM GOAL #1   Title  Patient will be independent in advanced  HEP and perform on a regular basis to continue improving pain and function after discharge from outpatient physical therapy.     Time  5    Period  Weeks    Status  New    Target Date  01/01/18   Patient will be out of town 11/18-23/2019     PT LONG TERM GOAL #2   Title  Patient will exhibit improved postural awareness independently to decrease joint and muscle stresses over time and improve patient functional ability over time.    Baseline  initial - cervical hyperextension, forward head rounded shoulders, thoracic kyphosis    Time  5    Period  Weeks     Status  New      PT LONG TERM GOAL #3   Title  Patient will report no greater than 2/10 pain over a 1 week period prior to re-evaluation to indicate improved pain and improved functional abliity.     Baseline  initial 5/10    Time  5    Period  Weeks    Status  New      PT LONG TERM GOAL #4   Title  Patient will exhibit a 10% or > improvement in FOTO limitation score to indicate improved perceived functional abilities.     Baseline  initial - FOTO 57% limitation    Time  5    Period  Weeks    Status  New            Plan - 12/19/17 1516    Clinical Impression Statement  reviewed goals and HEP.  Began seated cervical ROM and given excursions for HEP.  Also began scap and cervical retractions. Initiated massage wtih large spasms in bilateral Upper trap regions reduced but not totally resolved with manual.  Pt reported overall feeling looser, better following session. Pt with large amount of questions regarding her symtpoms and possible reactions of other therex that she is doing for her back and if these could be irritaitng her neck.  Requested pateint to bring these in next session to reveiw.      Rehab Potential  Good    Clinical Impairments Affecting Rehab Potential  positive - positive response to injection; negative - MRI results, postural impairments, osteopenia    PT Frequency  2x / week    PT Duration  --   5 weeks due to patient being out of town 11/18 - 12/07/2017   PT Treatment/Interventions  Traction;Iontophoresis 50m/ml Dexamethasone;Therapeutic activities;Therapeutic exercise;Neuromuscular re-education;Patient/family education;Manual techniques;Passive range of motion;Dry needling;Energy conservation;Taping;Joint Manipulations;Spinal Manipulations    PT Next Visit Plan  continue with manual therapy for STM to posterior cervical and periscapular muscles as needed.  Assess T-spine mobility, periscapular and C-spine strengthening, postural education, advance HEP as able.  PRECAUTION - osteopenia.  PT Home Exercise Plan  initial - supine axial extension/chin tuck, C-spine AAROM rotation on mylar ball    Consulted and Agree with Plan of Care  Patient       Patient will benefit from skilled therapeutic intervention in order to improve the following deficits and impairments:  Pain, Impaired sensation, Impaired UE functional use, Decreased strength, Decreased range of motion, Decreased activity tolerance, Postural dysfunction  Visit Diagnosis: Cervicalgia  Muscle weakness (generalized)  Abnormal posture  Other symptoms and signs involving the musculoskeletal system     Problem List Patient Active Problem List   Diagnosis Date Noted  . 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 ADD (attention deficit disorder) 01/27/2016  . Rosacea 01/27/2016  . Vitamin D deficiency 01/27/2016  . 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 Muscle weakness (generalized) 09/17/2013  . Scoliosis concern 09/17/2013  . Stiffness of joint, not elsewhere classified, pelvic region and thigh 09/17/2013  . Difficulty in walking(719.7) 09/17/2013   Teena Irani, PTA/CLT [id number]  Teena Irani 12/19/2017, 3:22 PM  [place] [address] [place], [place], [address] Phone: [phone]   Fax:  [phone]  Name: Lauren Knox MRN: [id number] Date of Birth: 18-Jun-1945

## 2017-12-24 ENCOUNTER — Encounter (HOSPITAL_COMMUNITY): Payer: Medicare HMO | Admitting: Physical Therapy

## 2017-12-26 ENCOUNTER — Ambulatory Visit (HOSPITAL_COMMUNITY): Payer: Medicare HMO | Admitting: Physical Therapy

## 2017-12-26 ENCOUNTER — Encounter (HOSPITAL_COMMUNITY): Payer: Self-pay | Admitting: Physical Therapy

## 2017-12-26 DIAGNOSIS — M6281 Muscle weakness (generalized): Secondary | ICD-10-CM | POA: Diagnosis not present

## 2017-12-26 DIAGNOSIS — R293 Abnormal posture: Secondary | ICD-10-CM

## 2017-12-26 DIAGNOSIS — M542 Cervicalgia: Secondary | ICD-10-CM | POA: Diagnosis not present

## 2017-12-26 DIAGNOSIS — R29898 Other symptoms and signs involving the musculoskeletal system: Secondary | ICD-10-CM | POA: Diagnosis not present

## 2017-12-26 NOTE — Therapy (Signed)
[place], [place], [address] Phone: [phone]   Fax:  [phone]  Physical Therapy Treatment  Patient Details  Name: Lauren Knox MRN: [id number] Date of Birth: 1945-11-06 Referring Provider (PT): Bo Merino, MD   Encounter Date: 12/26/2017  PT End of Session - 12/26/17 1352    Visit Number  3    Number of Visits  8    Date for PT Re-Evaluation  01/01/18   Patient will be out of town 11/18-23/2019   Authorization Type  [employer] medicare 25.00 co-pay. copay go towards oop which is 2000.00 met 170.00 no deduct visit limit is med ness OT PT & SPT no auth needed.Follow medicare guidelines     Authorization Time Period  cert 96/75/9163 - 84/66/5993    Authorization - Visit Number  3    Authorization - Number of Visits  8    PT Start Time  1350    PT Stop Time  1428    PT Time Calculation (min)  38 min    Activity Tolerance  Patient tolerated treatment well    Behavior During Therapy  WFL for tasks assessed/performed       Past Medical History:  Diagnosis Date  . ADD (attention deficit disorder) 01/27/2016  . 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 Osteopenia   . Raynaud's disease   . 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 Rosacea 01/27/2016  . Varicose veins   . Vitamin D deficiency 01/27/2016    Past Surgical History:  Procedure Laterality Date  . ABDOMINAL HYSTERECTOMY    . CESAREAN SECTION    . CHOLECYSTECTOMY    . INTESTINAL BLOCKAGE    . TONSILLECTOMY      There were no vitals filed for this visit.  Subjective Assessment - 12/26/17 1351    Subjective  Patient reported that her neck is feeling better today. She stated she felt good after the massage last session.     Currently in Pain?  No/denies                       [facility] Adult PT Treatment/Exercise - 12/26/17 0001       Neck Exercises: Seated   Neck Retraction  10 reps    Neck Retraction Limitations  3'' holds    W Back  10 reps    Shoulder Rolls  Backwards;10 reps    Other Seated Exercise  cervical excursions 10 reps each    Other Seated Exercise  Seated rows x 10       Manual Therapy   Manual Therapy  Soft tissue mobilization    Manual therapy comments  completed seperatetly from all other interventions    Soft tissue mobilization  to bilateral upper traps in seated             PT Education - 12/26/17 1352    Education Details  Reviewed purpose and technique of interventions throughout session.     Person(s) Educated  Patient    Methods  Explanation    Comprehension  Verbalized understanding       PT Short Term Goals - 11/27/17 2008      PT SHORT TERM GOAL #1   Title  Patient will be independent in initial HEP and perform on a regular basis to more quickly improve symptoms than with PT alone.  Time  3    Period  Weeks    Status  New    Target Date  12/18/17   Patient will be out of town 11/18-23/2019.     PT SHORT TERM GOAL #2   Title  Patient will exhibit 25% limitation in cervical spine AROM or better to indicate improved functional ability.    Baseline  see cervical spine AROM    Time  3    Period  Weeks    Status  New        PT Long Term Goals - 11/27/17 2011      PT LONG TERM GOAL #1   Title  Patient will be independent in advanced  HEP and perform on a regular basis to continue improving pain and function after discharge from outpatient physical therapy.     Time  5    Period  Weeks    Status  New    Target Date  01/01/18   Patient will be out of town 11/18-23/2019     PT LONG TERM GOAL #2   Title  Patient will exhibit improved postural awareness independently to decrease joint and muscle stresses over time and improve patient functional ability over time.    Baseline  initial - cervical hyperextension, forward head rounded shoulders, thoracic kyphosis     Time  5    Period  Weeks    Status  New      PT LONG TERM GOAL #3   Title  Patient will report no greater than 2/10 pain over a 1 week period prior to re-evaluation to indicate improved pain and improved functional abliity.     Baseline  initial 5/10    Time  5    Period  Weeks    Status  New      PT LONG TERM GOAL #4   Title  Patient will exhibit a 10% or > improvement in FOTO limitation score to indicate improved perceived functional abilities.     Baseline  initial - FOTO 57% limitation    Time  5    Period  Weeks    Status  New            Plan - 12/26/17 1413    Clinical Impression Statement  This session continued with established plan of care. Discussed with patient and reviewed HEP as well as benefits of continuing HEP and answered all questions about this. This session added seated rows to improve posture. This session also ended with soft tissue mobilization in order to improve relaxation and decrease pain.     Rehab Potential  Good    Clinical Impairments Affecting Rehab Potential  positive - positive response to injection; negative - MRI results, postural impairments, osteopenia    PT Frequency  2x / week    PT Duration  --   5 weeks due to patient being out of town 11/18 - 12/07/2017   PT Treatment/Interventions  Traction;Iontophoresis 63m/ml Dexamethasone;Therapeutic activities;Therapeutic exercise;Neuromuscular re-education;Patient/family education;Manual techniques;Passive range of motion;Dry needling;Energy conservation;Taping;Joint Manipulations;Spinal Manipulations    PT Next Visit Plan  continue with manual therapy for STM to posterior cervical and periscapular muscles as needed.  Assess T-spine mobility, periscapular and C-spine strengthening, postural education, advance HEP as able. PRECAUTION - osteopenia.      PT Home Exercise Plan  initial - supine axial extension/chin tuck, C-spine AAROM rotation on mylar ball    Consulted and Agree with Plan of Care   Patient  Patient will benefit from skilled therapeutic intervention in order to improve the following deficits and impairments:  Pain, Impaired sensation, Impaired UE functional use, Decreased strength, Decreased range of motion, Decreased activity tolerance, Postural dysfunction  Visit Diagnosis: Cervicalgia  Muscle weakness (generalized)  Abnormal posture  Other symptoms and signs involving the musculoskeletal system     Problem List Patient Active Problem List   Diagnosis Date Noted  . 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 ADD (attention deficit disorder) 01/27/2016  . Rosacea 01/27/2016  . Vitamin D deficiency 01/27/2016  . 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 Muscle weakness (generalized) 09/17/2013  . Scoliosis concern 09/17/2013  . Stiffness of joint, not elsewhere classified, pelvic region and thigh 09/17/2013  . Difficulty in walking(719.7) 09/17/2013   Clarene Critchley PT, DPT 2:29 PM, 12/26/17 [place], [place], [address] Phone: [phone]   Fax:  [phone]  Name: Lauren Knox MRN: [id number] Date of Birth: [birth year]

## 2017-12-31 ENCOUNTER — Encounter (HOSPITAL_COMMUNITY): Payer: Medicare HMO

## 2018-01-02 ENCOUNTER — Ambulatory Visit (HOSPITAL_COMMUNITY): Payer: Medicare HMO | Admitting: Physical Therapy

## 2018-01-02 ENCOUNTER — Encounter (HOSPITAL_COMMUNITY): Payer: Self-pay | Admitting: Physical Therapy

## 2018-01-02 DIAGNOSIS — R29898 Other symptoms and signs involving the musculoskeletal system: Secondary | ICD-10-CM

## 2018-01-02 DIAGNOSIS — M6281 Muscle weakness (generalized): Secondary | ICD-10-CM | POA: Diagnosis not present

## 2018-01-02 DIAGNOSIS — R293 Abnormal posture: Secondary | ICD-10-CM

## 2018-01-02 DIAGNOSIS — M542 Cervicalgia: Secondary | ICD-10-CM | POA: Diagnosis not present

## 2018-01-02 NOTE — Patient Instructions (Signed)
Low Row: Standing    Face anchor, feet shoulder width apart. Palms up, pull arms back, squeezing shoulder blades together. Repeat _10_ times per set. Do _1_ sets per session. Do _1_ sessions per week. Anchor Height: Waist  http://tub.exer.us/66   Copyright  VHI. All rights reserved.  Roll    Inhale and bring shoulders up, back, then exhale and relax shoulders down. Repeat __10_ times. Do _1__ times per day.  Copyright  VHI. All rights reserved.

## 2018-01-02 NOTE — Therapy (Signed)
[place] [address] [place], [place], [address] Phone: [phone]   Fax:  [phone]  Physical Therapy Treatment / Progress note / Discharge Summary  Patient Details  Name: Lauren Knox MRN: [id number] Date of Birth: Jul 08, 1945 Referring Provider (PT): Bo Merino, MD   Encounter Date: 01/02/2018   Progress Note Reporting Period 11/27/17 to 01/02/18  See note below for Objective Data and Assessment of Progress/Goals.   PHYSICAL THERAPY DISCHARGE SUMMARY  Visits from Start of Care: 4  Current functional level related to goals / functional outcomes: See below   Remaining deficits: See below   Education / Equipment: See below. Updated HEP.  Plan: Patient agrees to discharge.  Patient goals were met. Patient is being discharged due to meeting the stated rehab goals.  ?????         PT End of Session - 01/02/18 0954    Visit Number  4    Number of Visits  8    Date for PT Re-Evaluation  01/01/18   Patient will be out of town 11/18-23/2019   Authorization Type  [employer] medicare 25.00 co-pay. copay go towards oop which is 2000.00 met 170.00 no deduct visit limit is med ness OT PT & SPT no auth needed.Follow medicare guidelines     Authorization Time Period  cert 16/83/7290 - 21/11/5518    Authorization - Visit Number  4    Authorization - Number of Visits  8    PT Start Time  8022    PT Stop Time  1013   Session shortened for discharge   PT Time Calculation (min)  23 min    Activity Tolerance  Patient tolerated treatment well    Behavior During Therapy  [facility] for tasks assessed/performed       Past Medical History:  Diagnosis Date  . ADD (attention deficit disorder) 01/27/2016  . 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 Osteopenia   . Raynaud's disease   . 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 Rosacea  01/27/2016  . Varicose veins   . Vitamin D deficiency 01/27/2016    Past Surgical History:  Procedure Laterality Date  . ABDOMINAL HYSTERECTOMY    . CESAREAN SECTION    . CHOLECYSTECTOMY    . INTESTINAL BLOCKAGE    . TONSILLECTOMY      There were no vitals filed for this visit.  Subjective Assessment - 01/02/18 0952    Subjective  Patient stated that she feels that she can continue with her exercises on her own. She reported that she doesn't have any pain currently. Patient reported no greater than 2/10 over the last week.   Currently in Pain?  No/denies         [facility] PT Assessment - 01/02/18 0001      Observation/Other Assessments   Focus on Therapeutic Outcomes (FOTO)   34% limited   Was 57% limited     AROM   Cervical Flexion  25% limited    Cervical Extension  WNL    Cervical - Right Side Bend  25% limited    Cervical - Left Side Bend  25% limited    Cervical - Right Rotation  25% limited    Cervical - Left Rotation  25% limited  PT Education - 01/02/18 0953    Education Details  Discussed re-assessment findings.     Person(s) Educated  Patient    Methods  Explanation    Comprehension  Verbalized understanding       PT Short Term Goals - 01/02/18 1013      PT SHORT TERM GOAL #1   Title  Patient will be independent in initial HEP and perform on a regular basis to more quickly improve symptoms than with PT alone.     Time  3    Period  Weeks    Status  Achieved      PT SHORT TERM GOAL #2   Title  Patient will exhibit 25% limitation in cervical spine AROM or better to indicate improved functional ability.    Baseline  see cervical spine AROM    Time  3    Period  Weeks    Status  Achieved        PT Long Term Goals - 01/02/18 1013      PT LONG TERM GOAL #1   Title  Patient will be independent in advanced  HEP and perform on a regular basis to continue improving pain and function after discharge from  outpatient physical therapy.     Time  5    Period  Weeks    Status  Achieved      PT LONG TERM GOAL #2   Title  Patient will exhibit improved postural awareness independently to decrease joint and muscle stresses over time and improve patient functional ability over time.    Baseline  01/02/18: Patient with continued postural deficits, but able to express way to correct posture    Time  5    Period  Weeks    Status  Achieved      PT LONG TERM GOAL #3   Title  Patient will report no greater than 2/10 pain over a 1 week period prior to re-evaluation to indicate improved pain and improved functional abliity.     Baseline  01/02/18: Patient reported no greater than a 2/10 pain    Time  5    Period  Weeks    Status  Achieved      PT LONG TERM GOAL #4   Title  Patient will exhibit a 10% or > improvement in FOTO limitation score to indicate improved perceived functional abilities.     Baseline  See objective measures    Time  5    Period  Weeks    Status  Achieved            Plan - 01/02/18 1002    Clinical Impression Statement  This session performed a re-assessment of patient's progress towards goals. Patient had achieved all short term and long term goals at this re-assessment. Patient expresses confidence to continue exercises at home. Educated patient on the local senior center as well as updated patient's HEP. Patient is being discharged at this time as she has met the majority of her goals and because she feels good with her current functional status.     Rehab Potential  Good    Clinical Impairments Affecting Rehab Potential  positive - positive response to injection; negative - MRI results, postural impairments, osteopenia    PT Frequency  2x / week    PT Duration  --   5 weeks due to patient being out of town 11/18 - 12/07/2017   PT Treatment/Interventions  Traction;Iontophoresis '4mg'$ /ml Dexamethasone;Therapeutic activities;Therapeutic exercise;Neuromuscular  re-education;Patient/family education;Manual techniques;Passive range of motion;Dry needling;Energy conservation;Taping;Joint Manipulations;Spinal Manipulations    PT Next Visit Plan  Discharged    PT Home Exercise Plan  initial - supine axial extension/chin tuck, C-spine AAROM rotation on mylar ball; 01/02/18: Posterior shoulder rolls x 10, W back x 10; rows x 10 1 xday    Consulted and Agree with Plan of Care  Patient       Patient will benefit from skilled therapeutic intervention in order to improve the following deficits and impairments:  Pain, Impaired sensation, Impaired UE functional use, Decreased strength, Decreased range of motion, Decreased activity tolerance, Postural dysfunction  Visit Diagnosis: Cervicalgia  Muscle weakness (generalized)  Abnormal posture  Other symptoms and signs involving the musculoskeletal system     Problem List Patient Active Problem List   Diagnosis Date Noted  . 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 ADD (attention deficit disorder) 01/27/2016  . Rosacea 01/27/2016  . Vitamin D deficiency 01/27/2016  . 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 Muscle weakness (generalized) 09/17/2013  . Scoliosis concern 09/17/2013  . Stiffness of joint, not elsewhere classified, pelvic region and thigh 09/17/2013  . Difficulty in walking(719.7) 09/17/2013   Clarene Critchley PT, DPT 10:21 AM, 01/02/18 [place], [place], [address] Phone: [phone]   Fax:  [phone]  Name: Lauren Knox MRN: [id number] Date of Birth: 10/14/1945

## 2018-01-03 DIAGNOSIS — R05 Cough: Secondary | ICD-10-CM | POA: Diagnosis not present

## 2018-01-03 DIAGNOSIS — I10 Essential (primary) hypertension: Secondary | ICD-10-CM | POA: Diagnosis not present

## 2018-01-06 ENCOUNTER — Encounter (HOSPITAL_COMMUNITY): Payer: Medicare HMO | Admitting: Physical Therapy

## 2018-01-09 ENCOUNTER — Encounter (HOSPITAL_COMMUNITY): Payer: Medicare HMO | Admitting: Physical Therapy

## 2018-02-07 NOTE — Progress Notes (Signed)
Office Visit Note  Patient: Lauren Knox             Date of Birth: 06-Dec-1945           MRN: [id number]             PCP: Asencion Noble, MD Referring: Asencion Noble, MD Visit Date: 02/20/2018 Occupation: @GUAROCC @  Subjective:  Neck and lower back pain.   History of Present Illness: Lauren Knox is a 73 y.o. female history of osteoporosis, degenerative disc disease.  She states she has been experiencing some muscle spasms in the thoracic region.  Patient states that she has been doing some shoulder exercises in which she has to rotate and that is causing exacerbation of the thoracic pain.  She had been going to physical therapy for her cervical spine which was helpful.  She is also doing some exercises for her shoulders.  Patient has been taking calcium and vitamin D and doing resistance exercises.  Her last bone density in May 2019 was consistent with osteopenia.  Activities of Daily Living:  Patient reports morning stiffness for 2 minutes.   Patient Denies nocturnal pain.  Difficulty dressing/grooming: Denies Difficulty climbing stairs: Denies Difficulty getting out of chair: Denies Difficulty using hands for taps, buttons, cutlery, and/or writing: Denies  Review of Systems  Constitutional: Positive for fatigue. Negative for night sweats, weight gain and weight loss.  HENT: Negative for mouth sores, trouble swallowing, trouble swallowing, mouth dryness and nose dryness.   Eyes: Negative for pain, redness, visual disturbance and dryness.  Respiratory: Negative for cough, shortness of breath and difficulty breathing.   Cardiovascular: Negative for chest pain, palpitations, hypertension, irregular heartbeat and swelling in legs/feet.  Gastrointestinal: Negative for blood in stool, constipation and diarrhea.  Endocrine: Negative for increased urination.  Genitourinary: Negative for vaginal dryness.  Musculoskeletal: Positive for arthralgias, joint pain, myalgias, morning  stiffness and myalgias. Negative for joint swelling, muscle weakness and muscle tenderness.  Skin: Negative for color change, rash, hair loss, skin tightness, ulcers and sensitivity to sunlight.  Allergic/Immunologic: Negative for susceptible to infections.  Neurological: Negative for dizziness, memory loss, night sweats and weakness.  Hematological: Negative for swollen glands.  Psychiatric/Behavioral: Negative for depressed mood and sleep disturbance. The patient is not nervous/anxious.     PMFS History:  Patient Active Problem List   Diagnosis Date Noted  . 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 ADD (attention deficit disorder) 01/27/2016  . Rosacea 01/27/2016  . Vitamin D deficiency 01/27/2016  . 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 Muscle weakness (generalized) 09/17/2013  . Scoliosis concern 09/17/2013  . Stiffness of joint, not elsewhere classified, pelvic region and thigh 09/17/2013  . Difficulty in walking(719.7) 09/17/2013    Past Medical History:  Diagnosis Date  . ADD (attention deficit disorder) 01/27/2016  . 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 Osteopenia   . Raynaud's disease   . 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 Rosacea 01/27/2016  . Varicose veins   . Vitamin D deficiency 01/27/2016    Family History  Problem Relation Age of Onset  . Cancer Mother   . Cancer Father   . Heart attack Father   . Cancer Brother    Past Surgical History:  Procedure Laterality Date  . ABDOMINAL HYSTERECTOMY    .  CESAREAN SECTION    . CHOLECYSTECTOMY    . INTESTINAL BLOCKAGE    . TONSILLECTOMY     Social History   Social History Narrative  . Not on file    There is no immunization history on file for this patient.    Objective: Vital Signs: BP 127/80 (BP Location: Left Arm, Patient Position: Sitting, Cuff Size: Normal)   Pulse 62   Resp 13   Ht 5\' 4"  (1.626 m)   Wt 140 lb (63.5 kg)   BMI 24.03 kg/m    Physical Exam Vitals signs and nursing note reviewed.  Constitutional:      Appearance: She is well-developed.  HENT:     Head: Normocephalic and atraumatic.  Eyes:     Conjunctiva/sclera: Conjunctivae normal.  Neck:     Musculoskeletal: Normal range of motion.  Cardiovascular:     Rate and Rhythm: Normal rate and regular rhythm.     Heart sounds: Normal heart sounds.  Pulmonary:     Effort: Pulmonary effort is normal.     Breath sounds: Normal breath sounds.  Abdominal:     General: Bowel sounds are normal.     Palpations: Abdomen is soft.  Lymphadenopathy:     Cervical: No cervical adenopathy.  Skin:    General: Skin is warm and dry.     Capillary Refill: Capillary refill takes less than 2 seconds.  Neurological:     Mental Status: She is alert and oriented to person, place, and time.  Psychiatric:        Behavior: Behavior normal.      Musculoskeletal Exam: C-spine good range of motion.  She has thoracic kyphosis and some discomfort range of motion of her thoracic spine.  Lumbar spine with good range of motion.  She is some trapezius spasm.  Shoulder joints elbow joints were in good range of motion.  She has DIP and PIP thickening in her hands consistent with osteoarthritis.  Hip joints, knee joints, ankles with good range of motion with no synovitis.  CDAI Exam: CDAI Score: Not documented Patient Global Assessment: Not documented; Provider Global Assessment: Not documented Swollen: Not documented; Tender: Not documented Joint Exam   Not documented   There is currently no information documented on the homunculus. Go to the Rheumatology activity and complete the homunculus joint exam.  Investigation: No additional findings.  Imaging: No results found.  Recent  Labs: Lab Results  Component Value Date   NA 141 06/21/2015   K 4.1 06/21/2015    Speciality Comments: No specialty comments available.  Procedures:  No procedures performed Allergies: Codeine and Lisinopril   Assessment / Plan:     Visit Diagnoses: Age-related osteoporosis without current pathological fracture - T score -2.3  R femoral neck 05/2017.  Treated in the past with Fosamax and Reclast X1 in 2018.  Patient is only on calcium and vitamin D with exercises right now.  Vitamin D deficiency-she is on supplement.  DDD (degenerative disc disease), cervical - PT has been helpful for her.  She has decreased neck pain.  DDD (degenerative disc disease), thoracic-she complains of thoracic pain and also some muscle spasm.  Trapezius muscle spasm -improved after the injections.  Postural kyphosis of thoracolumbar region-exercises were demonstrated and discussed in the office today.  Primary osteoarthritis of both hands-joint protection muscle strengthening was discussed.  Raynaud'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-currently not very symptomatic.  Keeping the body temperature warm was discussed.  History of attention deficit disorder  History of rosacea  Orders: No orders of the defined types were placed in this encounter.  Meds ordered this encounter  Medications  . tiZANidine (ZANAFLEX) 4 MG tablet    Sig: Take 1 tablet (4 mg total) by mouth at bedtime as needed for muscle spasms.    Dispense:  30 tablet    Refill:  0    Face-to-face time spent with patient was 30 minutes. Greater than 50% of time was spent in counseling and coordination of care.  Follow-Up Instructions: Return in about 6 months (around 08/21/2018) for Osteoarthritis, DDD, OP.   Bo Merino, MD  Note - This record has been created using Editor, commissioning.  Chart creation errors have been sought, but may not always  have been located. Such creation errors do not reflect on  the standard of medical  care.

## 2018-02-20 ENCOUNTER — Encounter: Payer: Self-pay | Admitting: Physician Assistant

## 2018-02-20 ENCOUNTER — Ambulatory Visit: Payer: Medicare HMO | Admitting: Rheumatology

## 2018-02-20 VITALS — BP 127/80 | HR 62 | Resp 13 | Ht 64.0 in | Wt 140.0 lb

## 2018-02-20 DIAGNOSIS — Z872 Personal history of diseases of the skin and subcutaneous tissue: Secondary | ICD-10-CM | POA: Diagnosis not present

## 2018-02-20 DIAGNOSIS — Z8659 Personal history of other mental and behavioral disorders: Secondary | ICD-10-CM

## 2018-02-20 DIAGNOSIS — R69 Illness, unspecified: Secondary | ICD-10-CM | POA: Diagnosis not present

## 2018-02-20 DIAGNOSIS — M5134 Other intervertebral disc degeneration, thoracic region: Secondary | ICD-10-CM

## 2018-02-20 DIAGNOSIS — E559 Vitamin D deficiency, unspecified: Secondary | ICD-10-CM | POA: Diagnosis not present

## 2018-02-20 DIAGNOSIS — M4005 Postural kyphosis, thoracolumbar region: Secondary | ICD-10-CM

## 2018-02-20 DIAGNOSIS — I73 Raynaud'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: Secondary | ICD-10-CM | POA: Diagnosis not present

## 2018-02-20 DIAGNOSIS — M5030 Other cervical disc degeneration, unspecified cervical region: Secondary | ICD-10-CM | POA: Diagnosis not present

## 2018-02-20 DIAGNOSIS — M19041 Primary osteoarthritis, right hand: Secondary | ICD-10-CM

## 2018-02-20 DIAGNOSIS — M62838 Other muscle spasm: Secondary | ICD-10-CM

## 2018-02-20 DIAGNOSIS — M810 Age-related osteoporosis without current pathological fracture: Secondary | ICD-10-CM | POA: Diagnosis not present

## 2018-02-20 DIAGNOSIS — M19042 Primary osteoarthritis, left hand: Secondary | ICD-10-CM

## 2018-02-20 MED ORDER — TIZANIDINE HCL 4 MG PO TABS: 4.0000 mg | ORAL_TABLET | Freq: Every evening | ORAL | 0 refills | Status: DC | PRN

## 2018-02-20 NOTE — Patient Instructions (Signed)
Shoulder Exercises  Ask your health care provider which exercises are safe for you. Do exercises exactly as told by your health care provider and adjust them as directed. It is normal to feel mild stretching, pulling, tightness, or discomfort as you do these exercises, but you should stop right away if you feel sudden pain or your pain gets worse.Do not begin these exercises until told by your health care provider.  Range of Motion Exercises              These exercises warm up your muscles and joints and improve the movement and flexibility of your shoulder. These exercises also help to relieve pain, numbness, and tingling. These exercises involve stretching your injured shoulder directly.  Exercise A: Pendulum  1. Stand near a wall or a surface that you can hold onto for balance.  2. Bend at the waist and let your left / right arm hang straight down. Use your other arm to support you. Keep your back straight and do not lock your knees.  3. Relax your left / right arm and shoulder muscles, and move your hips and your trunk so your left / right arm swings freely. Your arm should swing because of the motion of your body, not because you are using your arm or shoulder muscles.  4. Keep moving your body so your arm swings in the following directions, as told by your health care provider:  ? Side to side.  ? Forward and backward.  ? In clockwise and counterclockwise circles.  5. Continue each motion for __________ seconds, or for as long as told by your health care provider.  6. Slowly return to the starting position.  Repeat __________ times. Complete this exercise __________ times a day.  Exercise B:Flexion, Standing  1. Stand and hold a broomstick, a cane, or a similar object. Place your hands a little more than shoulder-width apart on the object. Your left / right hand should be palm-up, and your other hand should be palm-down.  2. Keep your elbow straight and keep your shoulder muscles relaxed. Push the stick  down with your healthy arm to raise your left / right arm in front of your body, and then over your head until you feel a stretch in your shoulder.  ? Avoid shrugging your shoulder while you raise your arm. Keep your shoulder blade tucked down toward the middle of your back.  3. Hold for __________ seconds.  4. Slowly return to the starting position.  Repeat __________ times. Complete this exercise __________ times a day.  Exercise C: Abduction, Standing  1. Stand and hold a broomstick, a cane, or a similar object. Place your hands a little more than shoulder-width apart on the object. Your left / right hand should be palm-up, and your other hand should be palm-down.  2. While keeping your elbow straight and your shoulder muscles relaxed, push the stick across your body toward your left / right side. Raise your left / right arm to the side of your body and then over your head until you feel a stretch in your shoulder.  ? Do not raise your arm above shoulder height, unless your health care provider tells you to do that.  ? Avoid shrugging your shoulder while you raise your arm. Keep your shoulder blade tucked down toward the middle of your back.  3. Hold for __________ seconds.  4. Slowly return to the starting position.  Repeat __________ times. Complete this exercise __________ times a   day.  Exercise D:Internal Rotation  1. Place your left / right hand behind your back, palm-up.  2. Use your other hand to dangle an exercise band, a towel, or a similar object over your shoulder. Grasp the band with your left / right hand so you are holding onto both ends.  3. Gently pull up on the band until you feel a stretch in the front of your left / right shoulder.  ? Avoid shrugging your shoulder while you raise your arm. Keep your shoulder blade tucked down toward the middle of your back.  4. Hold for __________ seconds.  5. Release the stretch by letting go of the band and lowering your hands.  Repeat __________ times.  Complete this exercise __________ times a day.  Stretching Exercises    These exercises warm up your muscles and joints and improve the movement and flexibility of your shoulder. These exercises also help to relieve pain, numbness, and tingling. These exercises are done using your healthy shoulder to help stretch the muscles of your injured shoulder.  Exercise E: Corner Stretch (External Rotation and Abduction)  1. Stand in a doorway with one of your feet slightly in front of the other. This is called a staggered stance. If you cannot reach your forearms to the door frame, stand facing a corner of a room.  2. Choose one of the following positions as told by your health care provider:  ? Place your hands and forearms on the door frame above your head.  ? Place your hands and forearms on the door frame at the height of your head.  ? Place your hands on the door frame at the height of your elbows.  3. Slowly move your weight onto your front foot until you feel a stretch across your chest and in the front of your shoulders. Keep your head and chest upright and keep your abdominal muscles tight.  4. Hold for __________ seconds.  5. To release the stretch, shift your weight to your back foot.  Repeat __________ times. Complete this stretch __________ times a day.  Exercise F:Extension, Standing  1. Stand and hold a broomstick, a cane, or a similar object behind your back.  ? Your hands should be a little wider than shoulder-width apart.  ? Your palms should face away from your back.  2. Keeping your elbows straight and keeping your shoulder muscles relaxed, move the stick away from your body until you feel a stretch in your shoulder.  ? Avoid shrugging your shoulders while you move the stick. Keep your shoulder blade tucked down toward the middle of your back.  3. Hold for __________ seconds.  4. Slowly return to the starting position.  Repeat __________ times. Complete this exercise __________ times a  day.  Strengthening Exercises                   These exercises build strength and endurance in your shoulder. Endurance is the ability to use your muscles for a long time, even after they get tired.  Exercise G:External Rotation  1. Sit in a stable chair without armrests.  2. Secure an exercise band at elbow height on your left / right side.  3. Place a soft object, such as a folded towel or a small pillow, between your left / right upper arm and your body to move your elbow a few inches away (about 10 cm) from your side.  4. Hold the end of the band so it   is tight and there is no slack.  5. Keeping your elbow pressed against the soft object, move your left / right forearm out, away from your abdomen. Keep your body steady so only your forearm moves.  6. Hold for __________ seconds.  7. Slowly return to the starting position.  Repeat __________ times. Complete this exercise __________ times a day.  Exercise H:Shoulder Abduction  1. Sit in a stable chair without armrests, or stand.  2. Hold a __________ weight in your left / right hand, or hold an exercise band with both hands.  3. Start with your arms straight down and your left / right palm facing in, toward your body.  4. Slowly lift your left / right hand out to your side. Do not lift your hand above shoulder height unless your health care provider tells you that this is safe.  ? Keep your arms straight.  ? Avoid shrugging your shoulder while you do this movement. Keep your shoulder blade tucked down toward the middle of your back.  5. Hold for __________ seconds.  6. Slowly lower your arm, and return to the starting position.  Repeat __________ times. Complete this exercise __________ times a day.  Exercise I:Shoulder Extension  1. Sit in a stable chair without armrests, or stand.  2. Secure an exercise band to a stable object in front of you where it is at shoulder height.  3. Hold one end of the exercise band in each hand. Your palms should face each  other.  4. Straighten your elbows and lift your hands up to shoulder height.  5. Step back, away from the secured end of the exercise band, until the band is tight and there is no slack.  6. Squeeze your shoulder blades together as you pull your hands down to the sides of your thighs. Stop when your hands are straight down by your sides. Do not let your hands go behind your body.  7. Hold for __________ seconds.  8. Slowly return to the starting position.  Repeat __________ times. Complete this exercise __________ times a day.  Exercise J:Standing Shoulder Row  1. Sit in a stable chair without armrests, or stand.  2. Secure an exercise band to a stable object in front of you so it is at waist height.  3. Hold one end of the exercise band in each hand. Your palms should be in a thumbs-up position.  4. Bend each of your elbows to an "L" shape (about 90 degrees) and keep your upper arms at your sides.  5. Step back until the band is tight and there is no slack.  6. Slowly pull your elbows back behind you.  7. Hold for __________ seconds.  8. Slowly return to the starting position.  Repeat __________ times. Complete this exercise __________ times a day.  Exercise K:Shoulder Press-Ups  1. Sit in a stable chair that has armrests. Sit upright, with your feet flat on the floor.  2. Put your hands on the armrests so your elbows are bent and your fingers are pointing forward. Your hands should be about even with the sides of your body.  3. Push down on the armrests and use your arms to lift yourself off of the chair. Straighten your elbows and lift yourself up as much as you comfortably can.  ? Move your shoulder blades down, and avoid letting your shoulders move up toward your ears.  ? Keep your feet on the ground. As you get stronger, your   feet should support less of your body weight as you lift yourself up.  4. Hold for __________ seconds.  5. Slowly lower yourself back into the chair.  Repeat __________ times. Complete  this exercise __________ times a day.  Exercise L: Wall Push-Ups  1. Stand so you are facing a stable wall. Your feet should be about one arm-length away from the wall.  2. Lean forward and place your palms on the wall at shoulder height.  3. Keep your feet flat on the floor as you bend your elbows and lean forward toward the wall.  4. Hold for __________ seconds.  5. Straighten your elbows to push yourself back to the starting position.  Repeat __________ times. Complete this exercise __________ times a day.  This information is not intended to replace advice given to you by your health care provider. Make sure you discuss any questions you have with your health care provider.  Document Released: 11/15/2004 Document Revised: 05/07/2017 Document Reviewed: 09/12/2014  [employer] Interactive Patient Education  2019 [employer]          Back Exercises  The following exercises strengthen the muscles that help to support the back. They also help to keep the lower back flexible. Doing these exercises can help to prevent back pain or lessen existing pain.  If you have back pain or discomfort, try doing these exercises 2-3 times each day or as told by your health care provider. When the pain goes away, do them once each day, but increase the number of times that you repeat the steps for each exercise (do more repetitions). If you do not have back pain or discomfort, do these exercises once each day or as told by your health care provider.  Exercises  Single Knee to Chest  Repeat these steps 3-5 times for each leg:  1. Lie on your back on a firm bed or the floor with your legs extended.  2. Bring one knee to your chest. Your other leg should stay extended and in contact with the floor.  3. Hold your knee in place by grabbing your knee or thigh.  4. Pull on your knee until you feel a gentle stretch in your lower back.  5. Hold the stretch for 10-30 seconds.  6. Slowly release and straighten your leg.  Pelvic Tilt  Repeat  these steps 5-10 times:  1. Lie on your back on a firm bed or the floor with your legs extended.  2. Bend your knees so they are pointing toward the ceiling and your feet are flat on the floor.  3. Tighten your lower abdominal muscles to press your lower back against the floor. This motion will tilt your pelvis so your tailbone points up toward the ceiling instead of pointing to your feet or the floor.  4. With gentle tension and even breathing, hold this position for 5-10 seconds.  Cat-Cow  Repeat these steps until your lower back becomes more flexible:  1. Get into a hands-and-knees position on a firm surface. Keep your hands under your shoulders, and keep your knees under your hips. You may place padding under your knees for comfort.  2. Let your head hang down, and point your tailbone toward the floor so your lower back becomes rounded like the back of a cat.  3. Hold this position for 5 seconds.  4. Slowly lift your head and point your tailbone up toward the ceiling so your back forms a sagging arch like the back   of a cow.  5. Hold this position for 5 seconds.    Press-Ups  Repeat these steps 5-10 times:  1. Lie on your abdomen (face-down) on the floor.  2. Place your palms near your head, about shoulder-width apart.  3. While you keep your back as relaxed as possible and keep your hips on the floor, slowly straighten your arms to raise the top half of your body and lift your shoulders. Do not use your back muscles to raise your upper torso. You may adjust the placement of your hands to make yourself more comfortable.  4. Hold this position for 5 seconds while you keep your back relaxed.  5. Slowly return to lying flat on the floor.    Bridges  Repeat these steps 10 times:  1. Lie on your back on a firm surface.  2. Bend your knees so they are pointing toward the ceiling and your feet are flat on the floor.  3. Tighten your buttocks muscles and lift your buttocks off of the floor until your waist is at almost  the same height as your knees. You should feel the muscles working in your buttocks and the back of your thighs. If you do not feel these muscles, slide your feet 1-2 inches farther away from your buttocks.  4. Hold this position for 3-5 seconds.  5. Slowly lower your hips to the starting position, and allow your buttocks muscles to relax completely.  If this exercise is too easy, try doing it with your arms crossed over your chest.  Abdominal Crunches  Repeat these steps 5-10 times:  1. Lie on your back on a firm bed or the floor with your legs extended.  2. Bend your knees so they are pointing toward the ceiling and your feet are flat on the floor.  3. Cross your arms over your chest.  4. Tip your chin slightly toward your chest without bending your neck.  5. Tighten your abdominal muscles and slowly raise your trunk (torso) high enough to lift your shoulder blades a tiny bit off of the floor. Avoid raising your torso higher than that, because it can put too much stress on your low back and it does not help to strengthen your abdominal muscles.  6. Slowly return to your starting position.  Back Lifts  Repeat these steps 5-10 times:  1. Lie on your abdomen (face-down) with your arms at your sides, and rest your forehead on the floor.  2. Tighten the muscles in your legs and your buttocks.  3. Slowly lift your chest off of the floor while you keep your hips pressed to the floor. Keep the back of your head in line with the curve in your back. Your eyes should be looking at the floor.  4. Hold this position for 3-5 seconds.  5. Slowly return to your starting position.  Contact a health care provider if:   Your back pain or discomfort gets much worse when you do an exercise.   Your back pain or discomfort does not lessen within 2 hours after you exercise.  If you have any of these problems, stop doing these exercises right away. Do not do them again unless your health care provider says that you can.  Get help right  away if:   You develop sudden, severe back pain. If this happens, stop doing the exercises right away. Do not do them again unless your health care provider says that you can.  This information is   not intended to replace advice given to you by your health care provider. Make sure you discuss any questions you have with your health care provider.  Document Released: 02/09/2004 Document Revised: 05/07/2017 Document Reviewed: 02/25/2014  [employer] Interactive Patient Education  2019 [employer]

## 2018-03-10 DIAGNOSIS — Z6823 Body mass index (BMI) 23.0-23.9, adult: Secondary | ICD-10-CM | POA: Diagnosis not present

## 2018-03-10 DIAGNOSIS — I10 Essential (primary) hypertension: Secondary | ICD-10-CM | POA: Diagnosis not present

## 2018-03-10 DIAGNOSIS — R05 Cough: Secondary | ICD-10-CM | POA: Diagnosis not present

## 2018-04-03 NOTE — Progress Notes (Signed)
Office Visit Note  Patient: Lauren Knox             Date of Birth: [birth year]           MRN: [id number]             PCP: Asencion Noble, MD Referring: Asencion Noble, MD Visit Date: 04/04/2018 Occupation: @GUAROCC @  Subjective:  Left trapezius muscle spasms  History of Present Illness: Lauren Knox is a 73 y.o. female with history of osteoporosis and DDD.  She presents today with neck pain and trapezius muscle spasms bilaterally.  She reports the pain started Tuesday and has been constant.  She reports she was performing light exercises on Tuesday and then the discomfort started.  She has been having difficulty falling asleep due to the discomfort.  She takes Zanaflex 4 mg po at bedtime for muscle spasms. She has not been taking Robaxin.  She takes advil for pain relief.  She denies any other joint pain or joint swelling.     Activities of Daily Living:  Patient reports morning stiffness for 5  minutes.   Patient Reports nocturnal pain.  Difficulty dressing/grooming: Denies Difficulty climbing stairs: Denies Difficulty getting out of chair: Reports Difficulty using hands for taps, buttons, cutlery, and/or writing: Denies  Review of Systems  Constitutional: Positive for fatigue.  HENT: Positive for mouth dryness. Negative for mouth sores and nose dryness.   Eyes: Positive for dryness. Negative for pain and visual disturbance.  Respiratory: Negative for cough, hemoptysis, shortness of breath and difficulty breathing.   Cardiovascular: Negative for chest pain, palpitations, hypertension and swelling in legs/feet.  Gastrointestinal: Negative for blood in stool, constipation and diarrhea.  Endocrine: Negative for increased urination.  Genitourinary: Negative for painful urination.  Musculoskeletal: Positive for myalgias, morning stiffness, muscle tenderness and myalgias. Negative for arthralgias, joint pain, joint swelling and muscle weakness.  Skin: Negative for color change,  pallor, rash, hair loss, nodules/bumps, skin tightness, ulcers and sensitivity to sunlight.  Neurological: Negative for dizziness, numbness, headaches and weakness.  Hematological: Negative for swollen glands.  Psychiatric/Behavioral: Positive for sleep disturbance. Negative for depressed mood. The patient is not nervous/anxious.     PMFS History:  Patient Active Problem List   Diagnosis Date Noted  . 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 ADD (attention deficit disorder) 01/27/2016  . Rosacea 01/27/2016  . Vitamin D deficiency 01/27/2016  . 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 Muscle weakness (generalized) 09/17/2013  . Scoliosis concern 09/17/2013  . Stiffness of joint, not elsewhere classified, pelvic region and thigh 09/17/2013  . Difficulty in walking(719.7) 09/17/2013    Past Medical History:  Diagnosis Date  . ADD (attention deficit disorder) 01/27/2016  . 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 Osteopenia   . Raynaud's disease   . 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 Rosacea 01/27/2016  . Varicose veins   . Vitamin D deficiency 01/27/2016    Family History  Problem Relation Age of Onset  . Cancer Mother   . Cancer Father   . Heart attack Father   . Cancer Brother    Past Surgical History:  Procedure Laterality Date  . ABDOMINAL HYSTERECTOMY    . CESAREAN SECTION    . CHOLECYSTECTOMY    . INTESTINAL BLOCKAGE    . TONSILLECTOMY  Social History   Social History Narrative  . Not on file    There is no immunization history on file for this patient.   Objective: Vital Signs: BP 140/73 (BP Location: Left Arm, Patient Position: Sitting, Cuff Size: Normal)   Pulse 65   Resp 13   Ht 5\' 4"  (1.626 m)   Wt 141 lb 6.4 oz (64.1  kg)   BMI 24.27 kg/m    Physical Exam Vitals signs and nursing note reviewed.  Constitutional:      Appearance: She is well-developed.  HENT:     Head: Normocephalic and atraumatic.  Eyes:     Conjunctiva/sclera: Conjunctivae normal.  Neck:     Musculoskeletal: Normal range of motion.  Cardiovascular:     Rate and Rhythm: Normal rate and regular rhythm.     Heart sounds: Normal heart sounds.  Pulmonary:     Effort: Pulmonary effort is normal.     Breath sounds: Normal breath sounds.  Abdominal:     General: Bowel sounds are normal.     Palpations: Abdomen is soft.  Lymphadenopathy:     Cervical: No cervical adenopathy.  Skin:    General: Skin is warm and dry.     Capillary Refill: Capillary refill takes less than 2 seconds.  Neurological:     Mental Status: She is alert and oriented to person, place, and time.  Psychiatric:        Behavior: Behavior normal.      Musculoskeletal Exam: C-spine limited ROM with discomfort with lateral rotation.  Trapezius muscle spasms.  Thoracic and lumbar spine good ROM.  No midline spinal tenderness.  No SI joint tenderness.  Shoulder joints, elbow joints, wrist joints, MCPs, PIPs, and DIPs good ROM with no synovitis. PIP and DIP synovial thickening consistent with osteoarthritis.  Bilateral CMC joint synovial thickening. Complete fist formation bilaterally.  Hip joints, knee joints, ankle joints, MTPs, PIPs ,and DIPs good ROM with no synovitis.  No warmth or effusion of knee joints.  No tenderness or swelling of ankle joints.    CDAI Exam: CDAI Score: Not documented Patient Global Assessment: Not documented; Provider Global Assessment: Not documented Swollen: Not documented; Tender: Not documented Joint Exam   Not documented   There is currently no information documented on the homunculus. Go to the Rheumatology activity and complete the homunculus joint exam.  Investigation: No additional findings.  Imaging: No results found.   Recent Labs: Lab Results  Component Value Date   NA 141 06/21/2015   K 4.1 06/21/2015    Speciality Comments: No specialty comments available.  Procedures:  Trigger Point Inj Date/Time: 04/04/2018 9:40 AM Performed by: Ofilia Neas, PA-C Authorized by: Ofilia Neas, PA-C   Consent Given by:  Patient Site marked: the procedure site was marked   Timeout: prior to procedure the correct patient, procedure, and site was verified   Indications:  Pain Total # of Trigger Points:  2 Location: neck   Needle Size:  27 G Approach:  Dorsal Medications #1:  0.5 mL lidocaine 1 %; 10 mg triamcinolone acetonide 40 MG/ML Medications #2:  0.5 mL lidocaine 1 %; 10 mg triamcinolone acetonide 40 MG/ML Patient tolerance:  Patient tolerated the procedure well with no immediate complications   Allergies: Codeine and Lisinopril   Assessment / Plan:     Visit Diagnoses: Age-related osteoporosis without current pathological fracture - T score -2.3  R femoral neck 05/2017.  Treated in the past with Fosamax and Reclast x1  in 2018. She is taking a calcium and vitamin D supplement.   Vitamin D deficiency: She takes vitamin D 1,000 units po daily.   DDD (degenerative disc disease), cervical: She has limited ROM with painful lateral rotation.  She is having trapezius muscle spasms.  She has been evaluated by a neurosurgeon and has attended physical therapy.  She would like a referral to pain management.  Bilateral trigger point injections were performed today.  She tolerated the procedure well.  She was encouraged to continue to alternate using heat and ice.  Lidoderm patches will be sent to the pharmacy as well.  We discussed massage therapy as well.  DDD (degenerative disc disease), thoracic: No midline spinal tenderness.   Trapezius muscle spasm: She presents today with bilateral trapezius muscle spasms.  She has been having severe spasms since Tuesday.  She is performing light exercises when the spasms  began.  She has been alternating heat and ice and taking Zanaflex twice daily as needed and taking Advil for pain relief.  The pain is been constant and has been keeping her up at night.  She requested bilateral trigger point injections.  After informed consent bilateral trapezius were injected.  Procedure was completed above.  We discussed the importance of continuing to alternate heat and ice.  She would also like a referral to pain management for neck pain.  A prescription for Lidoderm patches will be sent to the pharmacy today.  Refills of Robaxin and Zanaflex were sent to the pharmacy.  She was advised to notify us if she develops any new or worsening symptoms.  Postural kyphosis of thoracolumbar region: She has no midline spinal tenderness.    Primary osteoarthritis of both hands: She has PIP and DIP synovial thickening consistent with osteoarthritis of bilateral hands.  She has bilateral CMC joints and no real thickening.  She has no synovitis on exam.  She has complete fist motion bilaterally.  She has no discomfort in her hands at this time.  Joint protection and muscle strengthening were discussed.  Raynaud'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: She has intermittent symptoms of raynaud's.  She has no digital ulcerations or signs of gangrene.    Other medical conditions are listed as follows:   History of rosacea  History of attention deficit disorder   Orders: Orders Placed This Encounter  Procedures  . Trigger Point Inj   Meds ordered this encounter  Medications  . methocarbamol (ROBAXIN) 500 MG tablet    Sig: Take 1 tablet (500 mg total) by mouth daily as needed for muscle spasms.    Dispense:  30 tablet    Refill:  0  . tiZANidine (ZANAFLEX) 4 MG tablet    Sig: Take 1 tablet (4 mg total) by mouth at bedtime as needed for muscle spasms.    Dispense:  30 tablet    Refill:  0  . lidocaine (LIDODERM) 5 %    Sig: Place 1 patch onto the skin daily. Remove & Discard patch within 12 hours or  as directed by MD    Dispense:  30 patch    Refill:  0    Face-to-face time spent with patient was 30 minutes. Greater than 50% of time was spent in counseling and coordination of care.  Follow-Up Instructions: Return in about 6 months (around 10/05/2018) for Osteoporosis, DDD.   Ofilia Neas, PA-C   I examined and evaluated the patient with Hazel Sams PA.  Patient is having generalized pain and discomfort.  The first  discomfort she is experiencing is in the trapezius area.  We have given her prescription for muscle relaxer and Lidoderm patches.  Bilateral trapezius areas were injected in the office today.  The plan of care was discussed as noted above.  Bo Merino, MD  Note - This record has been created using Editor, commissioning.  Chart creation errors have been sought, but may not always  have been located. Such creation errors do not reflect on  the standard of medical care.

## 2018-04-04 ENCOUNTER — Other Ambulatory Visit: Payer: Self-pay

## 2018-04-04 ENCOUNTER — Ambulatory Visit: Payer: Medicare HMO | Admitting: Rheumatology

## 2018-04-04 ENCOUNTER — Encounter: Payer: Self-pay | Admitting: Rheumatology

## 2018-04-04 VITALS — BP 140/73 | HR 65 | Resp 13 | Ht 64.0 in | Wt 141.4 lb

## 2018-04-04 DIAGNOSIS — M4005 Postural kyphosis, thoracolumbar region: Secondary | ICD-10-CM

## 2018-04-04 DIAGNOSIS — M810 Age-related osteoporosis without current pathological fracture: Secondary | ICD-10-CM | POA: Diagnosis not present

## 2018-04-04 DIAGNOSIS — R69 Illness, unspecified: Secondary | ICD-10-CM | POA: Diagnosis not present

## 2018-04-04 DIAGNOSIS — E559 Vitamin D deficiency, unspecified: Secondary | ICD-10-CM

## 2018-04-04 DIAGNOSIS — M62838 Other muscle spasm: Secondary | ICD-10-CM | POA: Diagnosis not present

## 2018-04-04 DIAGNOSIS — M19042 Primary osteoarthritis, left hand: Secondary | ICD-10-CM

## 2018-04-04 DIAGNOSIS — M5134 Other intervertebral disc degeneration, thoracic region: Secondary | ICD-10-CM | POA: Diagnosis not present

## 2018-04-04 DIAGNOSIS — M5030 Other cervical disc degeneration, unspecified cervical region: Secondary | ICD-10-CM | POA: Diagnosis not present

## 2018-04-04 DIAGNOSIS — Z872 Personal history of diseases of the skin and subcutaneous tissue: Secondary | ICD-10-CM

## 2018-04-04 DIAGNOSIS — M19041 Primary osteoarthritis, right hand: Secondary | ICD-10-CM

## 2018-04-04 DIAGNOSIS — I73 Raynaud'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: Secondary | ICD-10-CM

## 2018-04-04 DIAGNOSIS — Z8659 Personal history of other mental and behavioral disorders: Secondary | ICD-10-CM

## 2018-04-04 MED ORDER — METHOCARBAMOL 500 MG PO TABS: 500.0000 mg | ORAL_TABLET | Freq: Every day | ORAL | 0 refills | Status: DC | PRN

## 2018-04-04 MED ORDER — TRIAMCINOLONE ACETONIDE 40 MG/ML IJ SUSP
10.0000 mg | INTRAMUSCULAR | Status: AC | PRN
  Administered 2018-04-04: 10 mg via INTRAMUSCULAR

## 2018-04-04 MED ORDER — LIDOCAINE HCL 1 % IJ SOLN
0.5000 mL | INTRAMUSCULAR | Status: AC | PRN
  Administered 2018-04-04: .5 mL

## 2018-04-04 MED ORDER — LIDOCAINE 5 % EX PTCH: 1.0000 | MEDICATED_PATCH | CUTANEOUS | 0 refills | Status: DC

## 2018-04-04 MED ORDER — TIZANIDINE HCL 4 MG PO TABS: 4.0000 mg | ORAL_TABLET | Freq: Every evening | ORAL | 0 refills | Status: DC | PRN

## 2018-04-04 NOTE — Addendum Note (Signed)
Addended by: Francis Gaines C on: 04/04/2018 11:13 AM   Modules accepted: Orders

## 2018-04-11 ENCOUNTER — Telehealth: Payer: Self-pay | Admitting: *Deleted

## 2018-04-11 NOTE — Telephone Encounter (Signed)
Received a Prior Authorization request from [facility] for Lidocaine patches. Authorization has been submitted to patient's insurance via Cover My Meds. Will update once we receive a response.

## 2018-04-22 ENCOUNTER — Telehealth: Payer: Self-pay | Admitting: Rheumatology

## 2018-04-22 NOTE — Telephone Encounter (Signed)
Patient called requesting prescription refill of Tizanidine to be sent to [facility] at [address] in [place].  Patient states that she is out of medication.  Patient states Dr. Estanislado Pandy also told her she would be receiving prescriptions of Methocarbamol and Lidocaine patches.  Patient states [facility] never received the prescription for Methocarbamol.  Patient states her insurance company did not approve the Lidocaine patches. Patient states she has been experiencing spasms in her neck.  Patient requested return call.

## 2018-04-22 NOTE — Telephone Encounter (Signed)
Attempted to contact the patient and left message for patient to call the office.  

## 2018-04-22 NOTE — Telephone Encounter (Signed)
Patient has been scheduled for an appointment on 04/23/18 for a telephone visit.

## 2018-04-22 NOTE — Telephone Encounter (Signed)
patient states she is still having spasms in neck and it is going into her right arm. Patient states her neck is very tight. Patient states she is unable to go anywhere. Patient states she has been using ice and heat. Patient states it is interfering with her daily activities. Patient states she has been taking Tizanidine. Patient states the lidocaine patches were denied. Patient would like to know what else she may do or take for this discomfort. Please advise.

## 2018-04-22 NOTE — Telephone Encounter (Signed)
Please schedule appointment.

## 2018-04-23 ENCOUNTER — Encounter: Payer: Self-pay | Admitting: Rheumatology

## 2018-04-23 ENCOUNTER — Telehealth (INDEPENDENT_AMBULATORY_CARE_PROVIDER_SITE_OTHER): Payer: Medicare HMO | Admitting: Rheumatology

## 2018-04-23 DIAGNOSIS — E559 Vitamin D deficiency, unspecified: Secondary | ICD-10-CM

## 2018-04-23 DIAGNOSIS — Z8659 Personal history of other mental and behavioral disorders: Secondary | ICD-10-CM

## 2018-04-23 DIAGNOSIS — M5030 Other cervical disc degeneration, unspecified cervical region: Secondary | ICD-10-CM

## 2018-04-23 DIAGNOSIS — M5134 Other intervertebral disc degeneration, thoracic region: Secondary | ICD-10-CM

## 2018-04-23 DIAGNOSIS — M62838 Other muscle spasm: Secondary | ICD-10-CM

## 2018-04-23 DIAGNOSIS — M19041 Primary osteoarthritis, right hand: Secondary | ICD-10-CM

## 2018-04-23 DIAGNOSIS — M19042 Primary osteoarthritis, left hand: Secondary | ICD-10-CM

## 2018-04-23 DIAGNOSIS — Z872 Personal history of diseases of the skin and subcutaneous tissue: Secondary | ICD-10-CM

## 2018-04-23 DIAGNOSIS — M4005 Postural kyphosis, thoracolumbar region: Secondary | ICD-10-CM

## 2018-04-23 DIAGNOSIS — I73 Raynaud'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: Secondary | ICD-10-CM

## 2018-04-23 DIAGNOSIS — M810 Age-related osteoporosis without current pathological fracture: Secondary | ICD-10-CM

## 2018-04-23 NOTE — Progress Notes (Signed)
Virtual Visit via Telephone Note  I connected with Lauren Knox on 04/23/18 at 12:30 PM EDT by telephone and verified that I am speaking with the correct person using two identifiers.   I discussed the limitations, risks, security and privacy concerns of performing an evaluation and management service by telephone and the availability of in person appointments. I also discussed with the patient that there may be a patient responsible charge related to this service. The patient expressed understanding and agreed to proceed.  CC: Neck pain  History of Present Illness: Patient is a 73 year old female with past medical history of degenerative disc disease and myalgias.  Patient states that she has not had any relief in her leg pain.  She continues to have a lot of neck pain and spasms.  She also has upper back pain.  She has tried muscle relaxer without any relief.  She states she got only one muscle relaxer from the pharmacy and she has to get the other one.  She could not get an appointment at the pain clinic.  She states somebody from the pain clinic called but she thought it was a scam and she did not respond to the phone call.  She has seen neck and back specialist in the past and there was not much to offer.  Although she would like to go to a new neck or back specialist.  She states the injections in the past has been helpful.  She denies any Raynaud's phenomenon currently.  She continues to have some discomfort in her hands but no joint swelling.   Review of Systems  Constitutional: Negative for fever and malaise/fatigue.       Fatigue  Eyes: Negative for photophobia, pain, discharge and redness.  Respiratory: Negative for cough, shortness of breath and wheezing.   Cardiovascular: Negative for chest pain and palpitations.  Gastrointestinal: Negative for blood in stool, constipation and diarrhea.  Genitourinary: Negative for dysuria.  Musculoskeletal: Positive for back pain, joint pain,  myalgias and neck pain.  Skin: Negative for rash.  Neurological: Negative for dizziness and headaches.  Psychiatric/Behavioral: Positive for depression. The patient is nervous/anxious and has insomnia.    Observations/Objective: Physical Exam  Constitutional: She is oriented to person, place, and time.  Neurological: She is alert and oriented to person, place, and time.  Psychiatric: Mood, memory, affect and judgment normal.   Patient reports morning stiffness for 2  hours.   Patient reports nocturnal pain.  Difficulty dressing/grooming: Reports Difficulty climbing stairs: Reports Difficulty getting out of chair: Denies Difficulty using hands for taps, buttons, cutlery, and/or writing: Denies  Assessment and Plan: Age-related osteoporosis without current pathological fracture - T score -2.3  R femoral neck 05/2017.  Treated in the past with Fosamax and Reclast x1 in 2018. She is taking a calcium and vitamin D supplement.   Vitamin D deficiency: She takes vitamin D 1,000 units po daily.   DDD (degenerative disc disease), cervical: Patient continues to have severe pain and discomfort in her cervical region.  She states she has about of muscle spasm.  We called in Robaxin and Zanaflex at the last visit.  I have advised her to pick up the prescription from the pharmacy.  She was also referred to pain management.  Patient states she hung up the phone.  I have advised her to call the pain clinic so she can get her appointment rescheduled.  Per her request we will also refer her to a spinal specialist.  DDD (degenerative disc disease), thoracic: She continues to have thoracic pain.  Trapezius muscle spasm: She is on Robaxin, Zanaflex, and Lidoderm patches, she gets trigger point injections intermittently.  There is nothing else to offer at this point.  Postural kyphosis of thoracolumbar region: She has chronic thoracic and lumbar pain as well.  Primary osteoarthritis of both hands:   Joint protection muscle strengthening was discussed.  Raynaud'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: Raynolds is currently not active.  Follow Up Instructions:    I discussed the assessment and treatment plan with the patient. The patient was provided an opportunity to ask questions and all were answered. The patient agreed with the plan and demonstrated an understanding of the instructions.   The patient was advised to call back or seek an in-person evaluation if the symptoms worsen or if the condition fails to improve as anticipated.  I provided 25 minutes of non-face-to-face time during this encounter.  Bo Merino MD

## 2018-04-23 NOTE — Addendum Note (Signed)
Addended by: Earnestine Mealing on: 04/23/2018 03:04 PM   Modules accepted: Orders

## 2018-05-01 ENCOUNTER — Other Ambulatory Visit: Payer: Self-pay | Admitting: Physician Assistant

## 2018-05-01 NOTE — Telephone Encounter (Signed)
Last visit: 04/23/18 Next visit: 04/24/19  Okay to refill per Dr. Estanislado Pandy

## 2018-05-02 ENCOUNTER — Ambulatory Visit (INDEPENDENT_AMBULATORY_CARE_PROVIDER_SITE_OTHER): Payer: Medicare HMO | Admitting: Specialist

## 2018-05-09 ENCOUNTER — Encounter: Payer: Self-pay | Admitting: Physical Medicine & Rehabilitation

## 2018-05-22 DIAGNOSIS — E559 Vitamin D deficiency, unspecified: Secondary | ICD-10-CM | POA: Diagnosis not present

## 2018-05-22 DIAGNOSIS — M810 Age-related osteoporosis without current pathological fracture: Secondary | ICD-10-CM | POA: Diagnosis not present

## 2018-05-22 DIAGNOSIS — E1140 Type 2 diabetes mellitus with diabetic neuropathy, unspecified: Secondary | ICD-10-CM | POA: Diagnosis not present

## 2018-05-22 DIAGNOSIS — Z79899 Other long term (current) drug therapy: Secondary | ICD-10-CM | POA: Diagnosis not present

## 2018-05-22 DIAGNOSIS — I10 Essential (primary) hypertension: Secondary | ICD-10-CM | POA: Diagnosis not present

## 2018-05-27 ENCOUNTER — Other Ambulatory Visit: Payer: Self-pay

## 2018-05-27 ENCOUNTER — Encounter: Payer: Medicare HMO | Attending: Physical Medicine & Rehabilitation | Admitting: Physical Medicine & Rehabilitation

## 2018-05-27 ENCOUNTER — Encounter: Payer: Self-pay | Admitting: Physical Medicine & Rehabilitation

## 2018-05-27 VITALS — BP 152/92 | HR 72 | Temp 98.1°F | Ht 64.0 in | Wt 139.0 lb

## 2018-05-27 DIAGNOSIS — M47812 Spondylosis without myelopathy or radiculopathy, cervical region: Secondary | ICD-10-CM | POA: Diagnosis not present

## 2018-05-27 DIAGNOSIS — M47816 Spondylosis without myelopathy or radiculopathy, lumbar region: Secondary | ICD-10-CM | POA: Insufficient documentation

## 2018-05-27 NOTE — Progress Notes (Signed)
Subjective:    Patient ID: Lauren Knox, female    DOB: 12/12/1945, 73 y.o.   MRN: [id number]  HPI  CC:  Neck and Back pain  Neck pain radiates to the Left shoulder, started in 1998, responded to PT and cortisone shots, did ok until 2012  Low back pain mainly above the belt and does not radiate into legs  The patient has not had neck or back surgery. The patient has not had any spinal injections in the neck or back area  The patient has not been on chronic narcotic analgesics The patient has tried over-the-counter medications but was told not to take too much ibuprofen by 1 of her other physicians.  The ibuprofen does have some benefit for her pain. 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  He did not advise surgery at that time according to patient, note from 01/17/2017 reviewed  Patient saw "neck doctor" Dr. Sherwood Gambler who did not advise surgery.  Seen by PT, for neck pain in Dec 2019 Neck ROM, used ball under neck, this was helpful for the patient.  The patient also had 2/10 pain or less after she finished outpatient PT at [facility] on neck range of motion.  Reviewed x-rays of the cervical and lumbar spine from 2019. Pain Inventory Average Pain 10 Pain Right Now 0 My pain is intermittent, constant, sharp, burning, stabbing and aching  In the last 24 hours, has pain interfered with the following? General activity 4 Relation with others 4 Enjoyment of life 8 What TIME of day is your pain at its worst? all Sleep (in general) Fair  Pain is worse with: walking, bending, sitting, inactivity, standing and some activites Pain improves with: heat/ice and injections Relief from Meds: 2  Mobility walk without assistance ability to climb steps?  yes do you drive?  yes  Function retired  Neuro/Psych spasms   Prior Studies new patient  Physicians involved in your care new patient   Family History  Problem Relation Age of Onset  . Cancer Mother   . Cancer Father   . Heart attack Father   . Cancer Brother    Social History   Socioeconomic History  . Marital status: Married    Spouse name: Not on file  . Number of children: Not on file  . Years of education: Not on file  . Highest education level: Not on file  Occupational History  . Not on file  Social Needs  . Financial resource strain: Not on file  . Food insecurity:    Worry: Not on file    Inability: Not on file  . Transportation needs:    Medical: Not on file    Non-medical: Not on file  Tobacco Use  . Smoking status: Never Smoker  . Smokeless tobacco: Never Used  Substance and Sexual Activity  . Alcohol use: Yes    Alcohol/week: 4.0 standard drinks    Types: 4 Glasses of wine per week  . Drug use: No  . Sexual activity: Not on file  Lifestyle  . Physical activity:    Days per week: Not on file    Minutes per session: Not on file  . Stress: Not on file  Relationships  . Social connections:    Talks on phone: Not on file    Gets together: Not on  file    Attends religious service: Not on file    Active member of club or organization: Not on file    Attends meetings of clubs or organizations: Not on file    Relationship status: Not on file  Other Topics Concern  . Not on file  Social History Narrative  . Not on file   Past Surgical History:  Procedure Laterality Date  . ABDOMINAL HYSTERECTOMY    . CESAREAN SECTION    . CHOLECYSTECTOMY    . INTESTINAL BLOCKAGE    . TONSILLECTOMY     Past Medical History:  Diagnosis Date  . ADD (attention deficit disorder) 01/27/2016  . 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 Osteopenia   . Raynaud's disease   . 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 Rosacea 01/27/2016  .  Varicose veins   . Vitamin D deficiency 01/27/2016   BP (!) 152/92   Pulse 72   Temp 98.1 F (36.7 C)   Ht 5\' 4"  (1.626 m)   Wt 139 lb (63 kg)   SpO2 98%   BMI 23.86 kg/m   Opioid Risk Score:   Fall Risk Score:  `1  Depression screen PHQ 2/9  No flowsheet data found.   Review of Systems  Constitutional: Negative.   HENT: Negative.   Eyes: Negative.   Respiratory: Negative.   Cardiovascular: Negative.   Gastrointestinal: Negative.   Endocrine: Negative.   Genitourinary: Negative.   Musculoskeletal: Positive for back pain, myalgias and neck pain.  Skin: Negative.   Allergic/Immunologic: Negative.   Neurological: Negative.   Hematological: Negative.   Psychiatric/Behavioral: Negative.   All other systems reviewed and are negative.      Objective:   Physical Exam Vitals signs and nursing note reviewed.  Constitutional:      Appearance: Normal appearance.  HENT:     Head: Normocephalic and atraumatic.  Eyes:     Extraocular Movements: Extraocular movements intact.     Conjunctiva/sclera: Conjunctivae normal.     Pupils: Pupils are equal, round, and reactive to light.  Cardiovascular:     Rate and Rhythm: Normal rate and regular rhythm.     Heart sounds: Normal heart sounds. No murmur.  Pulmonary:     Effort: Pulmonary effort is normal. No respiratory distress.     Breath sounds: Normal breath sounds. No stridor.  Abdominal:     General: Abdomen is flat. Bowel sounds are normal. There is no distension.     Palpations: Abdomen is soft.     Tenderness: There is no right CVA tenderness or left CVA tenderness.  Neurological:     Mental Status: She is alert and oriented to person, place, and time.  Psychiatric:        Mood and Affect: Mood normal.        Behavior: Behavior normal.   Motor strength is 5/5 bilateral deltoid, bicep, tricep, grip, hip flexor, knee extensor, ankle dorsiflexor Negative straight leg raise Negative foraminal compression Posture she  has a head forward posture with upper thoracic kyphosis.  This can be partially corrected with chin brace position She has full range of motion at shoulders elbows wrists hands hips knees and ankles. Sensation intact bilateral C5 C6-C7-C8 L3-L4 L5-S1 dermatomal distribution Deep tendon reflexes 3+ bilateral biceps triceps brachioradialis and patellar 0 bilateral Achilles  Tenderness palpation right L3-L4 paraspinal area pain that increases with extension as well as right lateral bending.  There is no tenderness along the spinous processes no tenderness along the PSIS area.      Assessment & Plan:  1.  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  There is no significant cervical central spinal canal stenosis she has mild to moderate foraminal stenosis at C 7-T 1  We discussed improving posture chin brace positioning as well as Y TW exercises to work on upper back.  We also discussed maintaining proper posture while doing her daily walk  2.  Right-sided lumbar pain mid lumbar levels.  Findings most consistent with lumbar spondylosis with facet arthropathy is pain generator.  Will do diagnostic injections L2-L3-L4 medial branch blocks under fluoroscopic guidance  In terms of medication management recommend starting with Tylenol 650 mg up to 4 times daily as needed.  She may take her muscle relaxer on a as needed basis along with this if needed.

## 2018-05-27 NOTE — Patient Instructions (Addendum)
YTW exercises 3 sets of 10 repetitions either sitting or standing, may check out You Tube video  You also may checl  Try tylenol regular strength 2 tablets up to 4 times a day, may take with muscle relaxer if needed

## 2018-05-29 DIAGNOSIS — E1140 Type 2 diabetes mellitus with diabetic neuropathy, unspecified: Secondary | ICD-10-CM | POA: Diagnosis not present

## 2018-05-29 DIAGNOSIS — M810 Age-related osteoporosis without current pathological fracture: Secondary | ICD-10-CM | POA: Diagnosis not present

## 2018-05-29 DIAGNOSIS — R030 Elevated blood-pressure reading, without diagnosis of hypertension: Secondary | ICD-10-CM | POA: Diagnosis not present

## 2018-06-17 ENCOUNTER — Ambulatory Visit: Payer: Medicare HMO | Admitting: Physical Medicine & Rehabilitation

## 2018-06-20 ENCOUNTER — Encounter: Payer: Self-pay | Admitting: Physical Medicine & Rehabilitation

## 2018-06-20 ENCOUNTER — Other Ambulatory Visit: Payer: Self-pay

## 2018-06-20 ENCOUNTER — Encounter: Payer: Medicare HMO | Attending: Physical Medicine & Rehabilitation | Admitting: Physical Medicine & Rehabilitation

## 2018-06-20 VITALS — BP 148/88 | HR 64 | Temp 98.0°F | Ht 65.0 in | Wt 148.0 lb

## 2018-06-20 DIAGNOSIS — M47816 Spondylosis without myelopathy or radiculopathy, lumbar region: Secondary | ICD-10-CM

## 2018-06-20 DIAGNOSIS — M47812 Spondylosis without myelopathy or radiculopathy, cervical region: Secondary | ICD-10-CM | POA: Diagnosis not present

## 2018-06-20 NOTE — Progress Notes (Signed)
Patient returns today was scheduled for lumbar medial branch blocks but states that she no longer has her low back pain.  She has very episodic pain which occurs once every couple weeks or so.  She is not sure what brings it on.  She states that because of this she has limited her activity levels. She used to do more gardening and walking as well as exercising.  Has not been doing this. We discussed the importance of gluteal strengthening exercises and will print out this for the patient. We discussed that lumbar medial branch blocks are not helpful unless pain is constant and at least moderate in severity.  The patient also complains of intermittent muscle spasms she does get some relief with Robaxin.  These are not always predictable when they occur.  Examination General no acute distress Mood and affect are appropriate Speech is without dysarthria or aphasia Her back is no tenderness palpation along the lumbar or thoracic paraspinal musculature. No tenderness over the hip trochanteric bursa's She has good lumbar flexion extension lateral bending and rotation. Lower extremity strength is normal Gait is normal  Lumbar spondylosis without myelopathy.  Her pain is mainly intermittent and activity related although she cannot tell exactly what activities tend to cause pain.  We discussed the general recommendation not to hyperextend the lumbar spine.  We discussed the importance of gluteal strengthening and hip raises. She is to call if pain becomes consistent and we may need to schedule for medial branch blocks

## 2018-06-20 NOTE — Patient Instructions (Signed)
Core Strength Exercises    Core exercises help to build strength in the muscles between your ribs and your hips (abdominal muscles). These muscles help to support your body and keep your spine stable. It is important to maintain strength in your core to prevent injury and pain.  Some activities, such as yoga and Pilates, can help to strengthen core muscles. You can also strengthen core muscles with exercises at home. It is important to talk to your health care provider before you start a new exercise routine.  What are the benefits of core strength exercises?  Core strength exercises can:   Reduce back pain.   Help to rebuild strength after a back or spine injury.   Help to prevent injury during physical activity, especially injuries to the back and knees.  How to do core strength exercises  Repeat these exercises 10-15 times, or until you are tired. Do exercises exactly as told by your health care provider and adjust them as directed. It is normal to feel mild stretching, pulling, tightness, or discomfort as you do these exercises. If you feel any pain while doing these exercises, stop. If your pain continues or gets worse when doing core exercises, contact your health care provider.  You may want to use a padded yoga or exercise mat for strength exercises that are done on the floor.  Bridging    1. Lie on your back on a firm surface with your knees bent and your feet flat on the floor.  2. Raise your hips so that your knees, hips, and shoulders form a straight line together. Keep your abdominal muscles tight.  3. Hold this position for 3-5 seconds.  4. Slowly lower your hips to the starting position.  5. Let your muscles relax completely between repetitions.  Single-leg bridge  1. Lie on your back on a firm surface with your knees bent and your feet flat on the floor.  2. Raise your hips so that your knees, hips, and shoulders form a straight line together. Keep your abdominal muscles tight.  3. Lift one foot  off the floor, then completely straighten that leg.  4. Hold this position for 3-5 seconds.  5. Put the straight leg back down in the bent position.  6. Slowly lower your hips to the starting position.  7. Repeat these steps using your other leg.  Side bridge  1. Lie on your side with your knees bent. Prop yourself up on the elbow that is near the floor.  2. Using your abdominal muscles and your elbow that is on the floor, raise your body off the floor. Raise your hip so that your shoulder, hip, and foot form a straight line together.  3. Hold this position for 10 seconds. Keep your head and neck raised and away from your shoulder (in their normal, neutral position). Keep your abdominal muscles tight.  4. Slowly lower your hip to the starting position.  5. Repeat and try to hold this position longer, working your way up to 30 seconds.  Abdominal crunch  1. Lie on your back on a firm surface. Bend your knees and keep your feet flat on the floor.  2. Cross your arms over your chest.  3. Without bending your neck, tip your chin slightly toward your chest.  4. Tighten your abdominal muscles as you lift your chest just high enough to lift your shoulder blades off of the floor. Do not hold your breath. You can do this with short   lifts or long lifts.  5. Slowly return to the starting position.  Bird dog  1. Get on your hands and knees, with your legs shoulder-width apart and your arms under your shoulders. Keep your back straight.  2. Tighten your abdominal muscles.  3. Raise one of your legs off the floor and straighten it. Try to keep it parallel to the floor.  4. Slowly lower your leg to the starting position.  5. Raise one of your arms off the floor and straighten it. Try to keep it parallel to the floor.  6. Slowly lower your arm to the starting position.  7. Repeat with the other arm and leg. If possible, try raising a leg and arm at the same time, on opposite sides of the body. For example, raise your left hand and  your right leg.  Plank  1. Lie on your belly.  2. Prop up your body onto your forearms and your feet, keeping your legs straight. Your body should make a straight line between your shoulders and feet.  3. Hold this position for 10 seconds while keeping your abdominal muscles tight.  4. Lower your body to the starting position.  5. Repeat and try to hold this position longer, working your way up to 30 seconds.  Cross-core strengthening  1. Stand with your feet shoulder-width apart.  2. Hold a ball out in front of you. Keep your arms straight.  3. Tighten your abdominal muscles and slowly rotate at your waist from side to side. Keep your feet flat.  4. Once you are comfortable, try repeating this exercise with a heavier ball.  Top core strengthening  1. Stand about 18 inches (46 cm) in front of a wall, with your back to the wall.  2. Keep your feet flat and shoulder-width apart.  3. Tighten your abdominal muscles.  4. Bend your hips and knees.  5. Slowly reach between your legs to touch the wall behind you.  6. Slowly stand back up.  7. Raise your arms over your head and reach behind you.  8. Return to the starting position.  General tips   Do not do any exercises that cause pain. If you have pain while exercising, talk to your health care provider.   Always stretch before and after doing these exercises. This can help prevent injury.   Maintain a healthy weight. Ask your health care provider what weight is healthy for you.  Contact a health care provider if:   You have back pain that gets worse or does not go away.   You feel pain while doing core strength exercises.  Get help right away if:   You have severe pain that does not get better with medicine.  Summary   Core exercises help to build strength in the muscles between your ribs and your waist.   Core muscles help to support your body and keep your spine stable.   Some activities, such as yoga and Pilates, can help to strengthen core muscles.   Core  strength exercises can help back pain and can prevent injury.   If you feel any pain while doing core strength exercises, stop.  This information is not intended to replace advice given to you by your health care provider. Make sure you discuss any questions you have with your health care provider.  Document Released: 05/23/2016 Document Revised: 05/23/2016 Document Reviewed: 05/23/2016  Elsevier Interactive Patient Education  2019 [employer]

## 2018-06-20 NOTE — Progress Notes (Deleted)
  [facility]   Name: JESSIECA RHEM DOB:1945/07/10 MRN: [id number]  Date:06/20/2018  Physician: Alysia Penna, MD    Nurse/CMA: Bright CMA  Allergies:  Allergies  Allergen Reactions  . Codeine Nausea And Vomiting  . Lisinopril     Coughing     Consent Signed: Yes.    Is patient diabetic? No.  CBG today? NA Pregnant: No. LMP: No LMP recorded. Patient has had a hysterectomy. (age [age])  Anticoagulants: no Anti-inflammatory: no Antibiotics: no  Procedure: Right L2-4 Medial Branch Block  Position: Prone    Start Time: ***  End Time: ***  Fluoro Time: ***  RN/CMA Bright CMA Bright CMA    Time 1023am     BP 148/88     Pulse 64     Respirations 16 16    O2 Sat 96     S/S 6 6    Pain Level 0/10 /10     D/C home with Husband, patient A & O X 3, D/C instructions reviewed, and sits independently.

## 2018-06-23 ENCOUNTER — Telehealth: Payer: Self-pay | Admitting: *Deleted

## 2018-06-23 NOTE — Telephone Encounter (Signed)
Mrs Megna called to ask does she need to be in pain for the next visit (09/19/18) ? She came in Friday for an injection and because she was having no pain it was not done.  She also would like to know since she did not receive the injection was she going to be charged for that visit?  Please advise.

## 2018-06-23 NOTE — Telephone Encounter (Signed)
The patient I believe did get billed for a office visit since I did examine her and discuss treatment options including specific exercises.  She does not need to see me if she does not wish to September she can call if her pain gets worse

## 2018-06-24 MED ORDER — CYCLOBENZAPRINE HCL 5 MG PO TABS: 5.0000 mg | ORAL_TABLET | Freq: Three times a day (TID) | ORAL | 0 refills | Status: DC | PRN

## 2018-06-24 NOTE — Telephone Encounter (Signed)
I spoke with Lauren Knox and it sounds like her biggest issue right now is muscle spasms. She will keep the appt in Sept for now but if not in pain she will cancel. She was wondering about something she could take for spasms during the day. Please advise.

## 2018-06-25 NOTE — Telephone Encounter (Signed)
Notified that new Rx for flexeril sent to pharmacy.

## 2018-07-02 ENCOUNTER — Other Ambulatory Visit: Payer: Self-pay | Admitting: Rheumatology

## 2018-07-02 MED ORDER — METHOCARBAMOL 500 MG PO TABS: 500.0000 mg | ORAL_TABLET | Freq: Every day | ORAL | 0 refills | Status: DC | PRN

## 2018-07-02 MED ORDER — TIZANIDINE HCL 4 MG PO TABS: ORAL_TABLET | ORAL | 0 refills | Status: DC

## 2018-07-02 NOTE — Telephone Encounter (Signed)
Patient called stating "her neck pain seems to be getting worse."  Patient states she had an appointment with Dr. Letta Pate who gave her Cyclobenzaprine which she states did not help the pain.  Patient states she had an allergic reaction stating "after taking the medication she had a coughing fit that lasted until the medication wore off."  Patient stated he gave her exercises for her neck.  Patient states he only offered injections in her back, but she wasn't in pain so declined.    Patient is requesting prescription refill of Tizanidine and Methocarbamol that she has taken in the past which seems to work better than the new medication.  Patient was offered an appointment today, but unable to come in today.  Patient did schedule an appointment on 08/04/18 for neck injection and to be put on cancellation list.

## 2018-07-02 NOTE — Telephone Encounter (Signed)
Ok to refill 

## 2018-07-02 NOTE — Telephone Encounter (Signed)
Last Visit: 04/23/2018 telemedicine  Next Visit: 08/04/2018  Last fills: 05/01/2018, 04/04/2018  Okay to refill tizanidine and robaxin?

## 2018-07-03 NOTE — Progress Notes (Signed)
Office Visit Note  Patient: Lauren Knox             Date of Birth: 1945/03/27           MRN: [id number]             PCP: Asencion Noble, MD Referring: Asencion Noble, MD Visit Date: 07/04/2018 Occupation: @GUAROCC @  Subjective:  Pain in neck.   History of Present Illness: Lauren Knox is a 73 y.o. female with history of osteoarthritis, degenerative disc disease and osteoporosis.  She states she continues to have a lot of pain and discomfort in her cervical spine.  She was seen at pain management at the time she was not hurting as much.  She is having bilateral trapezius spasm and would like to have cortisone injections.  She has some underlying discomfort in her hands and her feet due to osteoarthritis.  She states she was prescribed Flexeril by pain management and she could not tolerate the side effects.  She is taking Zanaflex and Robaxin currently which helps to some extent.  She has been taking calcium and vitamin D.  Activities of Daily Living:  Patient reports morning stiffness for a couple of minutes.   Patient Reports nocturnal pain.  Difficulty dressing/grooming: Reports Difficulty climbing stairs: Denies Difficulty getting out of chair: Denies Difficulty using hands for taps, buttons, cutlery, and/or writing: Denies  Review of Systems  Constitutional: Positive for fatigue. Negative for night sweats, weight gain and weight loss.  HENT: Positive for mouth dryness. Negative for mouth sores, trouble swallowing, trouble swallowing and nose dryness.   Eyes: Negative for pain, redness, itching, visual disturbance and dryness.  Respiratory: Negative for cough, shortness of breath, wheezing and difficulty breathing.   Cardiovascular: Negative for chest pain, palpitations, hypertension, irregular heartbeat and swelling in legs/feet.  Gastrointestinal: Negative for abdominal pain, blood in stool, constipation and diarrhea.  Endocrine: Negative for increased urination.   Genitourinary: Negative for painful urination, pelvic pain and vaginal dryness.  Musculoskeletal: Positive for muscle weakness, morning stiffness and muscle tenderness. Negative for arthralgias, joint pain, joint swelling, myalgias and myalgias.  Skin: Negative for color change, rash, hair loss, redness, skin tightness, ulcers and sensitivity to sunlight.  Allergic/Immunologic: Negative for susceptible to infections.  Neurological: Negative for dizziness, light-headedness, headaches, memory loss, night sweats and weakness.  Hematological: Negative for swollen glands.  Psychiatric/Behavioral: Positive for sleep disturbance. Negative for depressed mood. The patient is not nervous/anxious.     PMFS History:  Patient Active Problem List   Diagnosis Date Noted  . 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 ADD (attention deficit disorder) 01/27/2016  . Rosacea 01/27/2016  . Vitamin D deficiency 01/27/2016  . 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 Muscle weakness (generalized) 09/17/2013  . Scoliosis concern 09/17/2013  . Stiffness of joint, not elsewhere classified, pelvic region and thigh 09/17/2013  . Difficulty in walking(719.7) 09/17/2013    Past Medical History:  Diagnosis Date  . ADD (attention deficit disorder) 01/27/2016  . 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 Osteopenia   . Raynaud's disease   . 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 Rosacea 01/27/2016  . Varicose veins   . Vitamin D deficiency 01/27/2016    Family History  Problem Relation Age of Onset  . Cancer Mother   .  Cancer Father   . Heart attack Father   . Cancer Brother    Past Surgical History:  Procedure Laterality Date  . ABDOMINAL HYSTERECTOMY    . CESAREAN SECTION    .  CHOLECYSTECTOMY    . INTESTINAL BLOCKAGE    . TONSILLECTOMY     Social History   Social History Narrative  . Not on file    There is no immunization history on file for this patient.   Objective: Vital Signs: BP (!) 155/79 (BP Location: Left Arm, Patient Position: Sitting, Cuff Size: Normal)   Pulse (!) 56   Resp 13   Ht 5' 4.5" (1.638 m)   Wt 138 lb 12.8 oz (63 kg)   BMI 23.46 kg/m    Physical Exam Vitals signs and nursing note reviewed.  Constitutional:      Appearance: She is well-developed.  HENT:     Head: Normocephalic and atraumatic.  Eyes:     Conjunctiva/sclera: Conjunctivae normal.  Neck:     Musculoskeletal: Normal range of motion.  Cardiovascular:     Rate and Rhythm: Normal rate and regular rhythm.     Heart sounds: Normal heart sounds.  Pulmonary:     Effort: Pulmonary effort is normal.     Breath sounds: Normal breath sounds.  Abdominal:     General: Bowel sounds are normal.     Palpations: Abdomen is soft.  Lymphadenopathy:     Cervical: No cervical adenopathy.  Skin:    General: Skin is warm and dry.     Capillary Refill: Capillary refill takes less than 2 seconds.  Neurological:     Mental Status: She is alert and oriented to person, place, and time.  Psychiatric:        Behavior: Behavior normal.      Musculoskeletal Exam: She has discomfort range of motion of her cervical spine.  She had bilateral trapezius spasm.  She has thoracic kyphosis and some limitation of range of motion of thoracic region.  Shoulder joints elbow joints wrist joints with good range of motion.  She has DIP and PIP thickening in her hands and feet consistent with osteoarthritis.  No synovitis was noted.  CDAI Exam: CDAI Score: - Patient Global: -; Provider Global: - Swollen: -; Tender: - Joint Exam   No joint exam has been documented for this visit   There is currently no information documented on the homunculus. Go to the Rheumatology activity and complete the  homunculus joint exam.  Investigation: No additional findings.  Imaging: No results found.  Recent Labs: Lab Results  Component Value Date   NA 141 06/21/2015   K 4.1 06/21/2015    Speciality Comments: No specialty comments available.  Procedures:  Trigger Point Inj  Date/Time: 07/04/2018 9:56 AM Performed by: Bo Merino, MD Authorized by: Bo Merino, MD   Consent Given by:  Patient Site marked: the procedure site was marked   Timeout: prior to procedure the correct patient, procedure, and site was verified   Indications:  Muscle spasm and pain Total # of Trigger Points:  2 Location: neck   Needle Size:  27 G Approach:  Dorsal Medications #1:  0.5 mL lidocaine 1 %; 10 mg triamcinolone acetonide 40 MG/ML Medications #2:  0.5 mL lidocaine 1 %; 10 mg triamcinolone acetonide 40 MG/ML Patient tolerance:  Patient tolerated the procedure well with no immediate complications   Allergies: Codeine, Cyclobenzaprine, and Lisinopril   Assessment / Plan:     Visit Diagnoses: Trapezius  muscle spasm - She is on Robaxin, Zanaflex, and Lidoderm patches, she gets trigger point injections intermittently.  Patient complains of severe trapezius muscle spasm today.  She requests bilateral trapezius injection.  After different treatment options were discussed bilateral trapezius areas were injected as described above.  She tolerated the procedure well.  DDD (degenerative disc disease), cervical - Plan: She has chronic cervical pain and has limited range of motion.  Have given a handout on C-spine exercises.  I offered physical therapy but she declined.  She states she will call us when she is ready.  DDD (degenerative disc disease), thoracic - Plan: Stretching exercises were discussed.  Postural kyphosis of thoracolumbar region - Plan: Postural correction was discussed.  Primary osteoarthritis of both hands - Plan: Joint protection muscle strengthening was discussed.  Raynaud's  syndrome without gangrene - Plan: Currently not active.  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  Treated in the past with Fosamax and Reclast x1 in 2018 - Plan: We will repeat bone density in May 2021.  Vitamin D deficiency - Plan: She is on supplement.  History of rosacea  History of attention deficit disorder   Orders: Orders Placed This Encounter  Procedures  . Trigger Point Inj  . Trigger Point Inj   No orders of the defined types were placed in this encounter.   Follow-Up Instructions: Return in about 6 months (around 01/03/2019) for DDD, OA, OP.   Bo Merino, MD  Note - This record has been created using Editor, commissioning.  Chart creation errors have been sought, but may not always  have been located. Such creation errors do not reflect on  the standard of medical care.

## 2018-07-04 ENCOUNTER — Ambulatory Visit: Payer: Medicare HMO | Admitting: Rheumatology

## 2018-07-04 ENCOUNTER — Encounter: Payer: Self-pay | Admitting: Rheumatology

## 2018-07-04 ENCOUNTER — Other Ambulatory Visit: Payer: Self-pay

## 2018-07-04 VITALS — BP 155/79 | HR 56 | Resp 13 | Ht 64.5 in | Wt 138.8 lb

## 2018-07-04 DIAGNOSIS — M5030 Other cervical disc degeneration, unspecified cervical region: Secondary | ICD-10-CM

## 2018-07-04 DIAGNOSIS — E559 Vitamin D deficiency, unspecified: Secondary | ICD-10-CM | POA: Diagnosis not present

## 2018-07-04 DIAGNOSIS — M810 Age-related osteoporosis without current pathological fracture: Secondary | ICD-10-CM

## 2018-07-04 DIAGNOSIS — M19042 Primary osteoarthritis, left hand: Secondary | ICD-10-CM | POA: Diagnosis not present

## 2018-07-04 DIAGNOSIS — M4005 Postural kyphosis, thoracolumbar region: Secondary | ICD-10-CM

## 2018-07-04 DIAGNOSIS — M19041 Primary osteoarthritis, right hand: Secondary | ICD-10-CM | POA: Diagnosis not present

## 2018-07-04 DIAGNOSIS — M5134 Other intervertebral disc degeneration, thoracic region: Secondary | ICD-10-CM

## 2018-07-04 DIAGNOSIS — I73 Raynaud'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: Secondary | ICD-10-CM

## 2018-07-04 DIAGNOSIS — M62838 Other muscle spasm: Secondary | ICD-10-CM

## 2018-07-04 MED ORDER — LIDOCAINE HCL 1 % IJ SOLN
0.5000 mL | INTRAMUSCULAR | Status: AC | PRN
  Administered 2018-07-04: .5 mL

## 2018-07-04 MED ORDER — TRIAMCINOLONE ACETONIDE 40 MG/ML IJ SUSP
10.0000 mg | INTRAMUSCULAR | Status: AC | PRN
  Administered 2018-07-04: 10 mg via INTRAMUSCULAR

## 2018-07-04 NOTE — Patient Instructions (Addendum)
Magnesium malate 250 mg at bedtime.   Cervical Strain and Sprain Rehab Ask your health care provider which exercises are safe for you. Do exercises exactly as told by your health care provider and adjust them as directed. It is normal to feel mild stretching, pulling, tightness, or discomfort as you do these exercises, but you should stop right away if you feel sudden pain or your pain gets worse.Do not begin these exercises until told by your health care provider. Stretching and range of motion exercises These exercises warm up your muscles and joints and improve the movement and flexibility of your neck. These exercises also help to relieve pain, numbness, and tingling. Exercise A: Cervical side bend  1. Using good posture, sit on a stable chair or stand up. 2. Without moving your shoulders, slowly tilt your left / right ear to your shoulder until you feel a stretch in your neck muscles. You should be looking straight ahead. 3. Hold for __________ seconds. 4. Repeat with the other side of your neck. Repeat __________ times. Complete this exercise __________ times a day. Exercise B: Cervical rotation  1. Using good posture, sit on a stable chair or stand up. 2. Slowly turn your head to the side as if you are looking over your left / right shoulder. ? Keep your eyes level with the ground. ? Stop when you feel a stretch along the side and the back of your neck. 3. Hold for __________ seconds. 4. Repeat this by turning to your other side. Repeat __________ times. Complete this exercise __________ times a day. Exercise C: Thoracic extension and pectoral stretch 1. Roll a towel or a small blanket so it is about 4 inches (10 cm) in diameter. 2. Lie down on your back on a firm surface. 3. Put the towel lengthwise, under your spine in the middle of your back. It should not be not under your shoulder blades. The towel should line up with your spine from your middle back to your lower back. 4. Put  your hands behind your head and let your elbows fall out to your sides. 5. Hold for __________ seconds. Repeat __________ times. Complete this exercise __________ times a day. Strengthening exercises These exercises build strength and endurance in your neck. Endurance is the ability to use your muscles for a long time, even after your muscles get tired. Exercise D: Upper cervical flexion, isometric 1. Lie on your back with a thin pillow behind your head and a small rolled-up towel under your neck. 2. Gently tuck your chin toward your chest and nod your head down to look toward your feet. Do not lift your head off the pillow. 3. Hold for __________ seconds. 4. Release the tension slowly. Relax your neck muscles completely before you repeat this exercise. Repeat __________ times. Complete this exercise __________ times a day. Exercise E: Cervical extension, isometric  1. Stand about 6 inches (15 cm) away from a wall, with your back facing the wall. 2. Place a soft object, about 6-8 inches (15-20 cm) in diameter, between the back of your head and the wall. A soft object could be a small pillow, a ball, or a folded towel. 3. Gently tilt your head back and press into the soft object. Keep your jaw and forehead relaxed. 4. Hold for __________ seconds. 5. Release the tension slowly. Relax your neck muscles completely before you repeat this exercise. Repeat __________ times. Complete this exercise __________ times a day. Posture and body mechanics Body mechanics  refers to the movements and positions of your body while you do your daily activities. Posture is part of body mechanics. Good posture and healthy body mechanics can help to relieve stress in your body's tissues and joints. Good posture means that your spine is in its natural S-curve position (your spine is neutral), your shoulders are pulled back slightly, and your head is not tipped forward. The following are general guidelines for applying  improved posture and body mechanics to your everyday activities. Standing   When standing, keep your spine neutral and keep your feet about hip-width apart. Keep a slight bend in your knees. Your ears, shoulders, and hips should line up.  When you do a task in which you stand in one place for a long time, place one foot up on a stable object that is 2-4 inches (5-10 cm) high, such as a footstool. This helps keep your spine neutral. Sitting   When sitting, keep your spine neutral and your keep feet flat on the floor. Use a footrest, if necessary, and keep your thighs parallel to the floor. Avoid rounding your shoulders, and avoid tilting your head forward.  When working at a desk or a computer, keep your desk at a height where your hands are slightly lower than your elbows. Slide your chair under your desk so you are close enough to maintain good posture.  When working at a computer, place your monitor at a height where you are looking straight ahead and you do not have to tilt your head forward or downward to look at the screen. Resting When lying down and resting, avoid positions that are most painful for you. Try to support your neck in a neutral position. You can use a contour pillow or a small rolled-up towel. Your pillow should support your neck but not push on it. This information is not intended to replace advice given to you by your health care provider. Make sure you discuss any questions you have with your health care provider. Document Released: 01/01/2005 Document Revised: 09/08/2015 Document Reviewed: 12/08/2014 Elsevier Interactive Patient Education  2019 [employer].

## 2018-08-04 ENCOUNTER — Ambulatory Visit: Payer: Medicare HMO | Admitting: Physician Assistant

## 2018-08-19 DIAGNOSIS — K591 Functional diarrhea: Secondary | ICD-10-CM | POA: Diagnosis not present

## 2018-08-21 ENCOUNTER — Ambulatory Visit: Payer: Medicare HMO | Admitting: Rheumatology

## 2018-09-19 ENCOUNTER — Ambulatory Visit: Payer: Medicare HMO | Admitting: Physical Medicine & Rehabilitation

## 2018-09-19 ENCOUNTER — Encounter: Payer: Medicare HMO | Admitting: Physical Medicine & Rehabilitation

## 2018-09-24 DIAGNOSIS — Z6823 Body mass index (BMI) 23.0-23.9, adult: Secondary | ICD-10-CM | POA: Diagnosis not present

## 2018-09-24 DIAGNOSIS — Z124 Encounter for screening for malignant neoplasm of cervix: Secondary | ICD-10-CM | POA: Diagnosis not present

## 2018-09-24 DIAGNOSIS — Z1231 Encounter for screening mammogram for malignant neoplasm of breast: Secondary | ICD-10-CM | POA: Diagnosis not present

## 2018-09-26 DIAGNOSIS — R69 Illness, unspecified: Secondary | ICD-10-CM | POA: Diagnosis not present

## 2018-09-29 DIAGNOSIS — D580 Hereditary spherocytosis: Secondary | ICD-10-CM | POA: Diagnosis not present

## 2018-09-29 DIAGNOSIS — I10 Essential (primary) hypertension: Secondary | ICD-10-CM | POA: Diagnosis not present

## 2018-10-01 DIAGNOSIS — H02831 Dermatochalasis of right upper eyelid: Secondary | ICD-10-CM | POA: Diagnosis not present

## 2018-10-01 DIAGNOSIS — H40013 Open angle with borderline findings, low risk, bilateral: Secondary | ICD-10-CM | POA: Diagnosis not present

## 2018-10-01 DIAGNOSIS — L719 Rosacea, unspecified: Secondary | ICD-10-CM | POA: Diagnosis not present

## 2018-10-01 DIAGNOSIS — H0102A Squamous blepharitis right eye, upper and lower eyelids: Secondary | ICD-10-CM | POA: Diagnosis not present

## 2018-10-01 DIAGNOSIS — H10413 Chronic giant papillary conjunctivitis, bilateral: Secondary | ICD-10-CM | POA: Diagnosis not present

## 2018-10-01 DIAGNOSIS — H0102B Squamous blepharitis left eye, upper and lower eyelids: Secondary | ICD-10-CM | POA: Diagnosis not present

## 2018-10-01 DIAGNOSIS — H2513 Age-related nuclear cataract, bilateral: Secondary | ICD-10-CM | POA: Diagnosis not present

## 2018-10-01 DIAGNOSIS — E119 Type 2 diabetes mellitus without complications: Secondary | ICD-10-CM | POA: Diagnosis not present

## 2018-10-01 DIAGNOSIS — H02834 Dermatochalasis of left upper eyelid: Secondary | ICD-10-CM | POA: Diagnosis not present

## 2018-10-16 DIAGNOSIS — H00021 Hordeolum internum right upper eyelid: Secondary | ICD-10-CM | POA: Diagnosis not present

## 2018-10-30 DIAGNOSIS — Z23 Encounter for immunization: Secondary | ICD-10-CM | POA: Diagnosis not present

## 2018-11-28 ENCOUNTER — Other Ambulatory Visit: Payer: Self-pay | Admitting: Physician Assistant

## 2018-11-28 NOTE — Telephone Encounter (Signed)
Last Visit: 07/04/2018 Next Visit: 01/02/2019   Last fill: 07/02/2018 30 tablets.   Okay to refill tizanidine?

## 2018-11-28 NOTE — Telephone Encounter (Signed)
Ok to refill 

## 2018-12-17 DIAGNOSIS — F341 Dysthymic disorder: Secondary | ICD-10-CM | POA: Diagnosis not present

## 2018-12-17 DIAGNOSIS — R69 Illness, unspecified: Secondary | ICD-10-CM | POA: Diagnosis not present

## 2019-01-02 ENCOUNTER — Ambulatory Visit: Payer: Medicare HMO | Admitting: Rheumatology

## 2019-02-03 DIAGNOSIS — D580 Hereditary spherocytosis: Secondary | ICD-10-CM | POA: Diagnosis not present

## 2019-02-03 DIAGNOSIS — I10 Essential (primary) hypertension: Secondary | ICD-10-CM | POA: Diagnosis not present

## 2019-03-13 ENCOUNTER — Telehealth: Payer: Self-pay | Admitting: Rheumatology

## 2019-03-13 MED ORDER — TIZANIDINE HCL 4 MG PO TABS: 4.0000 mg | ORAL_TABLET | Freq: Every day | ORAL | 0 refills | Status: DC

## 2019-03-13 NOTE — Telephone Encounter (Signed)
Last Visit: 07/04/2018 Next Visit: 06/26/2019  Okay to refill per Dr. Estanislado Pandy

## 2019-03-13 NOTE — Telephone Encounter (Signed)
Patient called requesting prescription refill of Tizanidine to be sent to [facility] [place]. [address] in [place].  Patient states she is out of pills and requesting the prescription be sent in today.

## 2019-03-25 ENCOUNTER — Telehealth: Payer: Self-pay | Admitting: Rheumatology

## 2019-03-25 NOTE — Telephone Encounter (Signed)
Her DEXA from 05/28/2017 was reviewed.  She is in the osteopenia range so ideally  she should have her DEXA updated every 2 years. If she would like her PCP to order the DEXA in September that should be fine.

## 2019-03-25 NOTE — Telephone Encounter (Signed)
Attempted to contact patient and left message for patient to call the office.  

## 2019-03-25 NOTE — Telephone Encounter (Signed)
Patient advised Her DEXA from 05/28/2017 was reviewed.  She is in the osteopenia range so ideally  she should have her DEXA updated every 2 years. If she would like her PCP to order the DEXA in September that should be fine.

## 2019-03-25 NOTE — Telephone Encounter (Signed)
Patient called stating she is due for her bone density scan in May and is checking if she could wait until September when she has her physical with her PCP.  Please advise

## 2019-04-24 ENCOUNTER — Ambulatory Visit: Payer: Self-pay | Admitting: Rheumatology

## 2019-06-16 NOTE — Progress Notes (Signed)
Office Visit Note  Patient: Lauren Knox             Date of Birth: Feb 07, 1945           MRN: [id number]             PCP: Asencion Noble, MD Referring: Asencion Noble, MD Visit Date: 06/26/2019 Occupation: @GUAROCC @  Subjective:  Trapezius and thoracic pain.   History of Present Illness: Lauren Knox is a 74 y.o. female with history of osteoarthritis and degenerative disc disease.  She states she continues to have some thoracic and lower back muscle spasms.  She has been swimming now which has been very helpful to relieve some of the discomfort.  She continues to have some stiffness in her hands due to underlying osteoarthritis.  She states she takes muscle relaxers only occasionally when she has a muscle spasm.  Her Raynaud's has not been as bothersome this year.  She is will be getting bone density sometime since September.  Activities of Daily Living:  Patient reports morning stiffness for  A few  minutes.   Patient Reports nocturnal pain.  Difficulty dressing/grooming: Denies Difficulty climbing stairs: Denies Difficulty getting out of chair: Denies Difficulty using hands for taps, buttons, cutlery, and/or writing: Reports  Review of Systems  Constitutional: Positive for fatigue.  HENT: Positive for mouth dryness. Negative for mouth sores and nose dryness.   Eyes: Positive for dryness. Negative for itching.  Respiratory: Negative for shortness of breath and difficulty breathing.   Cardiovascular: Negative for chest pain and palpitations.  Gastrointestinal: Negative for blood in stool, constipation and diarrhea.  Endocrine: Negative for increased urination.  Genitourinary: Negative for difficulty urinating and painful urination.  Musculoskeletal: Positive for arthralgias, joint pain, joint swelling and morning stiffness. Negative for myalgias, muscle tenderness and myalgias.  Skin: Positive for color change. Negative for rash.  Allergic/Immunologic: Negative for  susceptible to infections.  Neurological: Positive for numbness. Negative for dizziness, headaches, memory loss and weakness.  Hematological: Positive for bruising/bleeding tendency.  Psychiatric/Behavioral: Negative for confusion.    PMFS History:  Patient Active Problem List   Diagnosis Date Noted  . 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 ADD (attention deficit disorder) 01/27/2016  . Rosacea 01/27/2016  . Vitamin D deficiency 01/27/2016  . 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 Muscle weakness (generalized) 09/17/2013  . Scoliosis concern 09/17/2013  . Stiffness of joint, not elsewhere classified, pelvic region and thigh 09/17/2013  . Difficulty in walking(719.7) 09/17/2013    Past Medical History:  Diagnosis Date  . ADD (attention deficit disorder) 01/27/2016  . 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 Osteopenia   . Raynaud's disease   . 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 Rosacea 01/27/2016  . Varicose veins   . Vitamin D deficiency 01/27/2016    Family History  Problem Relation Age of Onset  . Cancer Mother   . Cancer Father   . Heart attack Father   . Cancer Brother    Past Surgical History:  Procedure Laterality Date  . ABDOMINAL HYSTERECTOMY    . CESAREAN SECTION    . CHOLECYSTECTOMY    . INTESTINAL BLOCKAGE    . TONSILLECTOMY     Social History   Social History Narrative  . Not on file    There  is no immunization history on file for this patient.   Objective: Vital Signs: BP 135/80 (BP Location: Right Arm, Patient Position: Sitting, Cuff Size: Normal)   Pulse (!) 54   Resp 15   Ht 5\' 2"  (1.575 m)   Wt 143 lb 12.8 oz (65.2 kg)   BMI 26.30 kg/m    Physical Exam Vitals and nursing note reviewed.  Constitutional:        Appearance: She is well-developed.  HENT:     Head: Normocephalic and atraumatic.  Eyes:     Conjunctiva/sclera: Conjunctivae normal.  Cardiovascular:     Rate and Rhythm: Normal rate and regular rhythm.     Heart sounds: Normal heart sounds.  Pulmonary:     Effort: Pulmonary effort is normal.     Breath sounds: Normal breath sounds.  Abdominal:     General: Bowel sounds are normal.     Palpations: Abdomen is soft.  Musculoskeletal:     Cervical back: Normal range of motion.  Lymphadenopathy:     Cervical: No cervical adenopathy.  Skin:    General: Skin is warm and dry.     Capillary Refill: Capillary refill takes less than 2 seconds.  Neurological:     Mental Status: She is alert and oriented to person, place, and time.  Psychiatric:        Behavior: Behavior normal.      Musculoskeletal Exam: She has good range of motion of her cervical spine.  She has mild thoracic kyphosis.  She had no point tenderness over thoracic or lumbar region.  Shoulder joints, elbow joints, wrist joints with good range of motion.  She has bilateral CMC PIP and DIP thickening.  Knee joints were in good range of motion with no discomfort.  She had thickening of her right ankle joint due to prior fracture.  She also has hammertoes.  CDAI Exam: CDAI Score: -- Patient Global: --; Provider Global: -- Swollen: --; Tender: -- Joint Exam 06/26/2019   No joint exam has been documented for this visit   There is currently no information documented on the homunculus. Go to the Rheumatology activity and complete the homunculus joint exam.  Investigation: No additional findings.  Imaging: No results found.  Recent Labs: Lab Results  Component Value Date   NA 141 06/21/2015   K 4.1 06/21/2015    Speciality Comments: No specialty comments available.  Procedures:  No procedures performed Allergies: Codeine and Lisinopril   Assessment / Plan:     Visit Diagnoses: Trapezius muscle spasm - She  is on Robaxin, Zanaflex, and Lidoderm patches, she gets trigger point injections intermittently.  She has been doing well without muscle spasm currently.  She uses muscle relaxers only occasionally.  DDD (degenerative disc disease), cervical-she has had a stiffness with range of motion.  DDD (degenerative disc disease), thoracic-she has chronic discomfort but no point tenderness on examination today.  Postural kyphosis of thoracolumbar region-a handout on exercises was given.  Primary osteoarthritis of both hands-joint protection muscle strengthening was discussed.  Primary osteoarthritis of both feet-proper fitting shoes were discussed.  Raynaud'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-she is currently not having much Raynaud's symptoms.  Age-related osteoporosis without current pathological fracture - T score -2.3  R femoral neck 05/2017.  Treated in the past with Fosamax and Reclast x1 in 2018 - Plan: We will repeat bone density in May 2021. - Plan: DG BONE DENSITY (DXA)  Vitamin D deficiency - Plan: DG BONE DENSITY (DXA)  History of attention  deficit disorder  History of rosacea  Orders: Orders Placed This Encounter  Procedures  . DG BONE DENSITY (DXA)   No orders of the defined types were placed in this encounter.   .  Follow-Up Instructions: Return in about 6 months (around 12/26/2019) for Osteoarthritis, Osteoporosis.   Bo Merino, MD  Note - This record has been created using Editor, commissioning.  Chart creation errors have been sought, but may not always  have been located. Such creation errors do not reflect on  the standard of medical care.

## 2019-06-26 ENCOUNTER — Other Ambulatory Visit: Payer: Self-pay

## 2019-06-26 ENCOUNTER — Ambulatory Visit: Payer: Medicare HMO | Admitting: Rheumatology

## 2019-06-26 ENCOUNTER — Encounter: Payer: Self-pay | Admitting: Rheumatology

## 2019-06-26 VITALS — BP 135/80 | HR 54 | Resp 15 | Ht 62.0 in | Wt 143.8 lb

## 2019-06-26 DIAGNOSIS — M4005 Postural kyphosis, thoracolumbar region: Secondary | ICD-10-CM | POA: Diagnosis not present

## 2019-06-26 DIAGNOSIS — Z8659 Personal history of other mental and behavioral disorders: Secondary | ICD-10-CM

## 2019-06-26 DIAGNOSIS — M19071 Primary osteoarthritis, right ankle and foot: Secondary | ICD-10-CM

## 2019-06-26 DIAGNOSIS — I73 Raynaud'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: Secondary | ICD-10-CM

## 2019-06-26 DIAGNOSIS — M5134 Other intervertebral disc degeneration, thoracic region: Secondary | ICD-10-CM | POA: Diagnosis not present

## 2019-06-26 DIAGNOSIS — M19042 Primary osteoarthritis, left hand: Secondary | ICD-10-CM

## 2019-06-26 DIAGNOSIS — M810 Age-related osteoporosis without current pathological fracture: Secondary | ICD-10-CM

## 2019-06-26 DIAGNOSIS — M5030 Other cervical disc degeneration, unspecified cervical region: Secondary | ICD-10-CM

## 2019-06-26 DIAGNOSIS — Z872 Personal history of diseases of the skin and subcutaneous tissue: Secondary | ICD-10-CM

## 2019-06-26 DIAGNOSIS — M19072 Primary osteoarthritis, left ankle and foot: Secondary | ICD-10-CM

## 2019-06-26 DIAGNOSIS — M62838 Other muscle spasm: Secondary | ICD-10-CM | POA: Diagnosis not present

## 2019-06-26 DIAGNOSIS — M19041 Primary osteoarthritis, right hand: Secondary | ICD-10-CM

## 2019-06-26 DIAGNOSIS — E559 Vitamin D deficiency, unspecified: Secondary | ICD-10-CM

## 2019-06-26 NOTE — Patient Instructions (Signed)

## 2019-07-15 ENCOUNTER — Other Ambulatory Visit: Payer: Self-pay | Admitting: *Deleted

## 2019-07-15 MED ORDER — TIZANIDINE HCL 4 MG PO TABS: 4.0000 mg | ORAL_TABLET | Freq: Every day | ORAL | 0 refills | Status: DC

## 2019-07-15 MED ORDER — METHOCARBAMOL 500 MG PO TABS: 500.0000 mg | ORAL_TABLET | Freq: Every day | ORAL | 0 refills | Status: DC | PRN

## 2019-07-15 NOTE — Telephone Encounter (Signed)
Last Visit: 06/26/2019 Next Visit: 12/25/2019   Okay to refill Methocarbamol and Tizanidine?

## 2019-10-07 ENCOUNTER — Ambulatory Visit (HOSPITAL_COMMUNITY)
Admission: RE | Admit: 2019-10-07 | Discharge: 2019-10-07 | Disposition: A | Discharge status: Home / Self Care | Payer: Medicare HMO | Source: Ambulatory Visit | Attending: Internal Medicine | Admitting: Internal Medicine

## 2019-10-07 ENCOUNTER — Other Ambulatory Visit: Payer: Self-pay

## 2019-10-07 ENCOUNTER — Other Ambulatory Visit (HOSPITAL_COMMUNITY): Payer: Self-pay | Admitting: Internal Medicine

## 2019-10-07 DIAGNOSIS — M79671 Pain in right foot: Secondary | ICD-10-CM

## 2019-10-07 DIAGNOSIS — M25561 Pain in right knee: Secondary | ICD-10-CM

## 2019-10-09 ENCOUNTER — Telehealth: Payer: Self-pay

## 2019-10-09 NOTE — Telephone Encounter (Signed)
DEXA results received from [facility].   Date of scan: 09/29/2019  T-score: -2.6 right femoral neck.   Patient was treated with fosamax in the past and reclast x1 in 2018.   Dr. Julien Girt recommended restarting reclast. Patient would like to discuss this with Dr. Estanislado Pandy or Hazel Sams, PA-C. Patient is scheduled for an appointment on 10/15/2019 with Hazel Sams, PA-C.

## 2019-10-13 NOTE — Progress Notes (Deleted)
Office Visit Note  Patient: Lauren Knox             Date of Birth: [birth year]           MRN: [id number]             PCP: Asencion Noble, MD Referring: Asencion Noble, MD Visit Date: 10/15/2019 Occupation: @GUAROCC @  Subjective:  No chief complaint on file.   History of Present Illness: KALEESI GUYTON is a 74 y.o. female ***   Activities of Daily Living:  Patient reports morning stiffness for *** {minute/hour:19697}.   Patient {ACTIONS;DENIES/REPORTS:21021675::"Denies"} nocturnal pain.  Difficulty dressing/grooming: {ACTIONS;DENIES/REPORTS:21021675::"Denies"} Difficulty climbing stairs: {ACTIONS;DENIES/REPORTS:21021675::"Denies"} Difficulty getting out of chair: {ACTIONS;DENIES/REPORTS:21021675::"Denies"} Difficulty using hands for taps, buttons, cutlery, and/or writing: {ACTIONS;DENIES/REPORTS:21021675::"Denies"}  No Rheumatology ROS completed.   PMFS History:  Patient Active Problem List   Diagnosis Date Noted  . 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 ADD (attention deficit disorder) 01/27/2016  . Rosacea 01/27/2016  . Vitamin D deficiency 01/27/2016  . 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 Muscle weakness (generalized) 09/17/2013  . Scoliosis concern 09/17/2013  . Stiffness of joint, not elsewhere classified, pelvic region and thigh 09/17/2013  . Difficulty in walking(719.7) 09/17/2013    Past Medical History:  Diagnosis Date  . ADD (attention deficit disorder) 01/27/2016  . 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 Osteopenia   . Raynaud's disease   . 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 Rosacea 01/27/2016  . Varicose veins   . Vitamin D deficiency 01/27/2016    Family History  Problem Relation Age of Onset  . Cancer Mother     . Cancer Father   . Heart attack Father   . Cancer Brother    Past Surgical History:  Procedure Laterality Date  . ABDOMINAL HYSTERECTOMY    . CESAREAN SECTION    . CHOLECYSTECTOMY    . INTESTINAL BLOCKAGE    . TONSILLECTOMY     Social History   Social History Narrative  . Not on file    There is no immunization history on file for this patient.   Objective: Vital Signs: There were no vitals taken for this visit.   Physical Exam   Musculoskeletal Exam: ***  CDAI Exam: CDAI Score: -- Patient Global: --; Provider Global: -- Swollen: --; Tender: -- Joint Exam 10/15/2019   No joint exam has been documented for this visit   There is currently no information documented on the homunculus. Go to the Rheumatology activity and complete the homunculus joint exam.  Investigation: No additional findings.  Imaging: DG Foot Complete Right  Result Date: 10/07/2019 CLINICAL DATA:  Right-sided foot pain, no known injury, initial encounter EXAM: RIGHT FOOT COMPLETE - 3+ VIEW COMPARISON:  None. FINDINGS: Mild soft tissue swelling is noted about the tibiotalar articulation. No acute fracture or dislocation is seen. IMPRESSION: Mild soft tissue swelling without acute bony abnormality. Electronically Signed   By: Inez Catalina M.D.   On: 10/07/2019 19:52    Recent Labs: Lab Results  Component Value Date   NA 141 06/21/2015   K 4.1 06/21/2015    Speciality Comments: No specialty comments available.  Procedures:  No procedures performed Allergies: Codeine and Lisinopril   Assessment / Plan:     Visit Diagnoses: No diagnosis found.  Orders: No orders  of the defined types were placed in this encounter.  No orders of the defined types were placed in this encounter.   Face-to-face time spent with patient was *** minutes. Greater than 50% of time was spent in counseling and coordination of care.  Follow-Up Instructions: No follow-ups on file.   Earnestine Mealing,  CMA  Note - This record has been created using Editor, commissioning.  Chart creation errors have been sought, but may not always  have been located. Such creation errors do not reflect on  the standard of medical care.

## 2019-10-15 ENCOUNTER — Ambulatory Visit: Payer: Self-pay | Admitting: Physician Assistant

## 2019-10-19 NOTE — Progress Notes (Signed)
Office Visit Note  Patient: Lauren Knox             Date of Birth: [birth year]           MRN: [id number]             PCP: Asencion Noble, MD Referring: Asencion Noble, MD Visit Date: 11/02/2019 Occupation: @GUAROCC @  Subjective:  Discuss DEXA   History of Present Illness: Lauren Knox is a 74 y.o. female with history osteoporosis, DDD, and osteoarthritis. She presents today to discuss DEXA results from 09/29/19.  She continues to take a calcium and vitamin D supplement as reccommended. She denies any recent falls or fractures. She states that she was previously taking Fosamax but can no longer tolerate. She received a Reclast infusion in 2018 but has not been on treatment since then. Patient reports that she continues to have intermittent pain in her lower back. She states that she typically has flares about every 3 months. She denies any symptoms of radiculopathy. She states that she experiences muscle spasms intermittently and takes Robaxin 500 mg 1 tablet by mouth daily as needed and Zanaflex 4 mg by mouth at bedtime as needed for muscle spasms.   She has been experiencing muscle spasms exacerbated by physical activity. She would like refills of both medications today. She states that she was previously evaluated by an orthopedist but did not want surgery at that time. She also had an appointment with Dr. Sherwood Gambler who has since retired. She has not tried physical therapy but plans on starting to go back to the pool to swim. She has been using a heating pad as well as a TENS unit at home which have been helpful.    Activities of Daily Living:  Patient reports morning stiffness for 5 minutes.   Patient Reports nocturnal pain.  Difficulty dressing/grooming: Reports Difficulty climbing stairs: Denies Difficulty getting out of chair: Denies Difficulty using hands for taps, buttons, cutlery, and/or writing: Reports  Review of Systems  Constitutional: Positive for fatigue.  HENT:  Positive for mouth dryness and nose dryness. Negative for mouth sores.   Eyes: Positive for itching and dryness. Negative for pain and visual disturbance.  Respiratory: Negative for cough, hemoptysis, shortness of breath and difficulty breathing.   Cardiovascular: Positive for swelling in legs/feet. Negative for chest pain and palpitations.  Gastrointestinal: Negative for abdominal pain, blood in stool, constipation and diarrhea.  Endocrine: Negative for increased urination.  Genitourinary: Negative for painful urination.  Musculoskeletal: Positive for arthralgias, joint pain, joint swelling, myalgias, muscle weakness, morning stiffness, muscle tenderness and myalgias.  Skin: Negative for color change, rash and redness.  Allergic/Immunologic: Negative for susceptible to infections.  Neurological: Negative for dizziness, numbness, headaches, memory loss and weakness.  Hematological: Negative for swollen glands.  Psychiatric/Behavioral: Positive for depressed mood and sleep disturbance. Negative for confusion. The patient is nervous/anxious.     PMFS History:  Patient Active Problem List   Diagnosis Date Noted  . 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 ADD (attention deficit disorder) 01/27/2016  . Rosacea 01/27/2016  . Vitamin D deficiency 01/27/2016  . 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 Muscle weakness (generalized) 09/17/2013  . Scoliosis concern 09/17/2013  . Stiffness of joint, not elsewhere classified, pelvic region and thigh 09/17/2013  . Difficulty in walking(719.7) 09/17/2013    Past Medical History:  Diagnosis Date  . ADD (  attention deficit disorder) 01/27/2016  . 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 Osteopenia   . Raynaud's disease   . Raynaud's  syndrome without gangrene 01/27/2016  . Rosacea 01/27/2016  . Varicose veins   . Vitamin D deficiency 01/27/2016    Family History  Problem Relation Age of Onset  . Cancer Mother   . Cancer Father   . Heart attack Father   . Cancer Brother    Past Surgical History:  Procedure Laterality Date  . ABDOMINAL HYSTERECTOMY    . CESAREAN SECTION    . CHOLECYSTECTOMY    . INTESTINAL BLOCKAGE    . TONSILLECTOMY     Social History   Social History Narrative  . Not on file   Immunization History  Administered Date(s) Administered  . PFIZER SARS-COV-2 Vaccination 02/21/2019, 03/14/2019     Objective: Vital Signs: BP (!) 115/58 (BP Location: Left Arm, Patient Position: Sitting, Cuff Size: Small)   Pulse (!) 56   Ht 5\' 4"  (1.626 m)   Wt 144 lb 12.8 oz (65.7 kg)   BMI 24.85 kg/m    Physical Exam Vitals and nursing note reviewed.  Constitutional:      Appearance: She is well-developed.  HENT:     Head: Normocephalic and atraumatic.  Eyes:     Conjunctiva/sclera: Conjunctivae normal.  Pulmonary:     Effort: Pulmonary effort is normal.  Abdominal:     Palpations: Abdomen is soft.  Musculoskeletal:     Cervical back: Normal range of motion.  Skin:    General: Skin is warm and dry.     Capillary Refill: Capillary refill takes less than 2 seconds.  Neurological:     Mental Status: She is alert and oriented to person, place, and time.  Psychiatric:        Behavior: Behavior normal.      Musculoskeletal Exam: C-spine limited ROM with lateral rotation.  Thoracic and lumbar spine good ROM.  No midline spinal tenderness.  No SI joint tenderness.  Tenderness in the paraspinal muscles on the right side of the lumbar region.  Shoulder joints, elbow joints, wrist joints, MCPs, PIPs, and DIPs good ROM with no synovitis. Complete fist formation bilaterally.  Hip joints, knee joints, ankle joints, MTPs, PIPs, and DIPs good ROM with no synovitis.  No warmth or effusion of knee joints.  No  tenderness of ankle joints.  No tenderness of MTP joints.  PIP and DIP thickening consistent with osteoarthritis of both feet.   CDAI Exam: CDAI Score: -- Patient Global: --; Provider Global: -- Swollen: --; Tender: -- Joint Exam 11/02/2019   No joint exam has been documented for this visit   There is currently no information documented on the homunculus. Go to the Rheumatology activity and complete the homunculus joint exam.  Investigation: No additional findings.  Imaging: DG Foot Complete Right  Result Date: 10/07/2019 CLINICAL DATA:  Right-sided foot pain, no known injury, initial encounter EXAM: RIGHT FOOT COMPLETE - 3+ VIEW COMPARISON:  None. FINDINGS: Mild soft tissue swelling is noted about the tibiotalar articulation. No acute fracture or dislocation is seen. IMPRESSION: Mild soft tissue swelling without acute bony abnormality. Electronically Signed   By: Inez Catalina M.D.   On: 10/07/2019 19:52    Recent Labs: Lab Results  Component Value Date   NA 141 06/21/2015   K 4.1 06/21/2015    Speciality Comments: No specialty comments available.  Procedures:  No procedures performed Allergies: Codeine and Lisinopril   Assessment / Plan:     Visit Diagnoses: Trapezius muscle spasm -Resolved. She is not currently experiencing any trapezius muscle spasms or tension at this time. She has limited ROM of the C-spine with lateral rotation.  She is on Robaxin 500 mg 1 tablet daily PRN and zanaflex 4 mg at bedtime PRN for muscle spasms. She has not had trigger point injections since June 2020.    DDD (degenerative disc disease), cervical: She has limited ROM with lateral rotation. She is not experiencing any neck pain or stiffness at this time.   DDD (degenerative disc disease), thoracic: No midline spinal tenderness.    Postural kyphosis of thoracolumbar region: No midline spinal tenderness.   Spondylosis without myelopathy or radiculopathy, lumbar region:X-rays of the lumbar  spine were obtained by Dr. Lorin Mercy on 01/17/17: Lumbar spondylosis with degenerative facet changes and mild disc space narrowing L3-S1.  She has been experiencing increased lower back pain over the past few days. She has flares in her back every 3 months exacerbated by physical activities. She is not experiencing any symptoms of radiculopathy at this time.  She has been experiencing right sided muscle spasms.  She takes robaxin 500 mg 1 tablet daily as needed and zanaflex 4 mg at bedtime prn for muscle spasms.  We discussed the use of TENS unit and heating pad.  Also recommended massage therapy.  She declined physical therapy at this time.  She was advised to start water aerobics/swimming on a regular basis.  She was advised to notify us if she would like a referral to [facility] in the future.    Primary osteoarthritis of both hands: She has PIP and DIP thickening consistent with osteoarthritis of both hands.  She has no joint tenderness or inflammation on exam.  She is able to make a complete fist bilaterally.  She has no discomfort in her hands at this time.   Primary osteoarthritis of both feet: She has been experiencing intermittent discomfort in the right ankle joint.  Tenderness to palpation noted on exam.  She was advised to follow up with her podiatrist who previously performed surgery.  We discussed the importance of wearing proper fitting shoes.   Raynaud'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: Not currently active.  No digital ulcerations or signs of gangrene noted.   Age-related osteoporosis without current pathological fracture -  DEXA on 09/29/19: Right femoral neck T-score -2.6.  She was previously treated with fosamax, which she could not tolerate.  She received 1 reclast IV infusion in 2018.  We discussed resuming yearly reclast infusions and she was in agreement.  Potential side effects were reviewed.  Order for reclast pending lab work drawn at [facility] office.  We  will try to obtain these results.  She was encouraged to continue to take a calcium and vitamin D supplement as recommended.  We discussed the importance of resistive exercises.   We will notify her once she can schedule the reclast infusion.   Vitamin D deficiency: She takes vitamin D 1,000 units daily.   Other medical conditions are listed as follows:   History of rosacea  History of attention deficit disorder  Orders: No orders of the defined types were placed in this encounter.  Meds ordered this encounter  Medications  . methocarbamol (ROBAXIN) 500 MG tablet    Sig: Take 1 tablet (500 mg total) by mouth daily as needed for muscle spasms.    Dispense:  30 tablet    Refill:  0  . tiZANidine (ZANAFLEX) 4 MG tablet    Sig: Take 1 tablet (4 mg total) by mouth at bedtime.    Dispense:  30 tablet    Refill:  0      Follow-Up Instructions: Return in about 6 months (around 05/02/2020) for Osteoporosis, Osteoarthritis, DDD.   Ofilia Neas, PA-C  Note - This record has been created using Dragon software.  Chart creation errors have been sought, but may not always  have been located. Such creation errors do not reflect on  the standard of medical care.

## 2019-11-02 ENCOUNTER — Ambulatory Visit: Payer: Medicare HMO | Admitting: Physician Assistant

## 2019-11-02 ENCOUNTER — Other Ambulatory Visit: Payer: Self-pay

## 2019-11-02 ENCOUNTER — Encounter: Payer: Self-pay | Admitting: Physician Assistant

## 2019-11-02 ENCOUNTER — Telehealth: Payer: Self-pay

## 2019-11-02 VITALS — BP 115/58 | HR 56 | Ht 64.0 in | Wt 144.8 lb

## 2019-11-02 DIAGNOSIS — M62838 Other muscle spasm: Secondary | ICD-10-CM | POA: Diagnosis not present

## 2019-11-02 DIAGNOSIS — Z872 Personal history of diseases of the skin and subcutaneous tissue: Secondary | ICD-10-CM

## 2019-11-02 DIAGNOSIS — M47816 Spondylosis without myelopathy or radiculopathy, lumbar region: Secondary | ICD-10-CM

## 2019-11-02 DIAGNOSIS — Z8659 Personal history of other mental and behavioral disorders: Secondary | ICD-10-CM

## 2019-11-02 DIAGNOSIS — M19041 Primary osteoarthritis, right hand: Secondary | ICD-10-CM

## 2019-11-02 DIAGNOSIS — M5030 Other cervical disc degeneration, unspecified cervical region: Secondary | ICD-10-CM | POA: Diagnosis not present

## 2019-11-02 DIAGNOSIS — M4005 Postural kyphosis, thoracolumbar region: Secondary | ICD-10-CM | POA: Diagnosis not present

## 2019-11-02 DIAGNOSIS — E559 Vitamin D deficiency, unspecified: Secondary | ICD-10-CM

## 2019-11-02 DIAGNOSIS — M19071 Primary osteoarthritis, right ankle and foot: Secondary | ICD-10-CM

## 2019-11-02 DIAGNOSIS — M19072 Primary osteoarthritis, left ankle and foot: Secondary | ICD-10-CM

## 2019-11-02 DIAGNOSIS — M5134 Other intervertebral disc degeneration, thoracic region: Secondary | ICD-10-CM

## 2019-11-02 DIAGNOSIS — M810 Age-related osteoporosis without current pathological fracture: Secondary | ICD-10-CM

## 2019-11-02 DIAGNOSIS — M19042 Primary osteoarthritis, left hand: Secondary | ICD-10-CM

## 2019-11-02 DIAGNOSIS — I73 Raynaud'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: Secondary | ICD-10-CM

## 2019-11-02 MED ORDER — TIZANIDINE HCL 4 MG PO TABS: 4.0000 mg | ORAL_TABLET | Freq: Every day | ORAL | 0 refills | Status: DC

## 2019-11-02 MED ORDER — METHOCARBAMOL 500 MG PO TABS: 500.0000 mg | ORAL_TABLET | Freq: Every day | ORAL | 0 refills | Status: DC | PRN

## 2019-11-02 NOTE — Telephone Encounter (Signed)
I called [facility] to obtain recent labs from September 2021 and was advised patient needed to sign a release. I called patient to advise and she will go sign a release at their office.  Pending receipt of the labs, patient will need reclast orders per Hazel Sams, PA-C. Thanks!

## 2019-11-04 ENCOUNTER — Telehealth: Payer: Self-pay

## 2019-11-04 ENCOUNTER — Other Ambulatory Visit: Payer: Self-pay | Admitting: Physician Assistant

## 2019-11-04 DIAGNOSIS — M810 Age-related osteoporosis without current pathological fracture: Secondary | ICD-10-CM

## 2019-11-04 NOTE — Telephone Encounter (Signed)
Patient advised Reclast infusion orders have been placed. Patient advised she can call to schedule her infusion. Patient expressed understanding.

## 2019-11-04 NOTE — Telephone Encounter (Signed)
Received labs from [facility] on 09/29/2019  CMP: Albumin 4.8, GFR 59 Vitamin D 31.9 TSH 2.740

## 2019-11-04 NOTE — Telephone Encounter (Signed)
Patient called to confirm Dr. Estanislado Pandy received her labwork results from [facility].   Patient requested a return call.

## 2019-11-04 NOTE — Progress Notes (Signed)
Reclast IV infusion orders placed today.   Reviewed order set with Dr. Estanislado Pandy.   Last Visit: 11/02/19  Next Visit: 05/03/20  Labs: CMP, Vitamin D, and TSH drawn on 09/29/19.   Vitamin D was 31.9.   GFR was 59.  DEXA updated on 09/29/19: T-score -2.6.   Previously on fosamax-d/c due to side effects. She had 1 reclast infusion previously in 2018.   She is taking a calcium and vitamin D supplement as recommended.   Orders placed for Reclast x 1 dose along with premedication of Tylenol and Benadryl.   Adrian for the patient to call and schedule reclast infusion.   Hazel Sams, PA-C

## 2019-11-04 NOTE — Telephone Encounter (Signed)
Patient advised we have not received labs from [facility] yet. Patient will call their office to have them send them now.

## 2019-11-04 NOTE — Telephone Encounter (Signed)
Please call the patient to schedule her infusion.

## 2019-11-05 ENCOUNTER — Telehealth: Payer: Self-pay

## 2019-11-05 NOTE — Telephone Encounter (Signed)
Patient left a voicemail stating she spoke with you earlier today regarding the number to call for [facility].

## 2019-11-06 NOTE — Telephone Encounter (Signed)
Contacted patient and advised patient of number to infusion center at [facility].

## 2019-11-12 ENCOUNTER — Ambulatory Visit: Payer: Medicare HMO

## 2019-11-13 ENCOUNTER — Encounter (HOSPITAL_COMMUNITY): Payer: Self-pay

## 2019-11-13 ENCOUNTER — Encounter (HOSPITAL_COMMUNITY)
Admission: RE | Admit: 2019-11-13 | Discharge: 2019-11-13 | Disposition: A | Discharge status: Home / Self Care | Payer: Medicare HMO | Source: Ambulatory Visit | Attending: Rheumatology | Admitting: Rheumatology

## 2019-11-13 ENCOUNTER — Other Ambulatory Visit: Payer: Self-pay

## 2019-11-13 DIAGNOSIS — M810 Age-related osteoporosis without current pathological fracture: Secondary | ICD-10-CM | POA: Diagnosis present

## 2019-11-13 MED ORDER — ZOLEDRONIC ACID 5 MG/100ML IV SOLN
5.0000 mg | Freq: Once | INTRAVENOUS | Status: AC
  Administered 2019-11-13: 5 mg via INTRAVENOUS

## 2019-11-13 MED ORDER — SODIUM CHLORIDE 0.9 % IV SOLN: Freq: Once | INTRAVENOUS | Status: AC

## 2019-11-13 MED ORDER — DIPHENHYDRAMINE HCL 25 MG PO CAPS
25.0000 mg | ORAL_CAPSULE | Freq: Once | ORAL | Status: AC
  Administered 2019-11-13: 25 mg via ORAL
  Filled 2019-11-13: qty 1

## 2019-11-13 MED ORDER — ACETAMINOPHEN 325 MG PO TABS
650.0000 mg | ORAL_TABLET | Freq: Once | ORAL | Status: AC
  Administered 2019-11-13: 650 mg via ORAL
  Filled 2019-11-13: qty 2

## 2019-11-16 LAB — POCT I-STAT, CHEM 8
BUN: 27 mg/dL — ABNORMAL HIGH (ref 8–23)
Calcium, Ion: 1.27 mmol/L (ref 1.15–1.40)
Chloride: 103 mmol/L (ref 98–111)
Creatinine, Ser: 1.2 mg/dL — ABNORMAL HIGH (ref 0.44–1.00)
Glucose, Bld: 110 mg/dL — ABNORMAL HIGH (ref 70–99)
HCT: 39 % (ref 36.0–46.0)
Hemoglobin: 13.3 g/dL (ref 12.0–15.0)
Potassium: 4.6 mmol/L (ref 3.5–5.1)
Sodium: 140 mmol/L (ref 135–145)
TCO2: 26 mmol/L (ref 22–32)

## 2019-11-16 NOTE — Progress Notes (Signed)
Please contact patient's PCP and get recent chemistries to compare creatinine value.

## 2019-11-18 ENCOUNTER — Other Ambulatory Visit: Payer: Self-pay | Admitting: Rheumatology

## 2019-11-18 DIAGNOSIS — Z5181 Encounter for therapeutic drug level monitoring: Secondary | ICD-10-CM

## 2019-11-18 NOTE — Progress Notes (Signed)
I called patient and discussed that her creatinine was elevated.  I have advised her to repeat BMP with GFR next week.  She plans to get it done through Dr. Ria Comment office.  I will place a future order.

## 2019-11-19 ENCOUNTER — Ambulatory Visit: Payer: Medicare HMO | Attending: Internal Medicine

## 2019-11-19 DIAGNOSIS — Z23 Encounter for immunization: Secondary | ICD-10-CM

## 2019-11-19 NOTE — Progress Notes (Signed)
   Covid-19 Vaccination Clinic  Name:  Lauren Knox    MRN: [id number] DOB: 06/26/1945  11/19/2019  Lauren Knox was observed post Covid-19 immunization for 15 minutes without incident. She was provided with Vaccine Information Sheet and instruction to access the V-Safe system.   Lauren Knox was instructed to call 911 with any severe reactions post vaccine: Marland Kitchen Difficulty breathing  . Swelling of face and throat  . A fast heartbeat  . A bad rash all over body  . Dizziness and weakness

## 2019-11-24 ENCOUNTER — Telehealth: Payer: Self-pay | Admitting: Rheumatology

## 2019-11-24 ENCOUNTER — Other Ambulatory Visit: Payer: Self-pay | Admitting: *Deleted

## 2019-11-24 DIAGNOSIS — Z5181 Encounter for therapeutic drug level monitoring: Secondary | ICD-10-CM

## 2019-11-24 NOTE — Telephone Encounter (Signed)
Lab Orders faxed

## 2019-11-24 NOTE — Telephone Encounter (Signed)
Patient calling in reference to lab orders that were suppose to be sent over to Dr. Knute Neu office on Friday. Per Dr. Ria Comment office the orders were not received. Please sent orders over, and call patient to advise.

## 2019-11-28 LAB — BASIC METABOLIC PANEL WITH GFR
BUN/Creatinine Ratio: 28 (calc) — ABNORMAL HIGH (ref 6–22)
BUN: 27 mg/dL — ABNORMAL HIGH (ref 7–25)
CO2: 25 mmol/L (ref 20–32)
Calcium: 9.9 mg/dL (ref 8.6–10.4)
Chloride: 106 mmol/L (ref 98–110)
Creat: 0.98 mg/dL — ABNORMAL HIGH (ref 0.60–0.93)
GFR, Est African American: 67 mL/min/{1.73_m2} (ref >=60)
GFR, Est Non African American: 58 mL/min/{1.73_m2} — ABNORMAL LOW (ref >=60)
Glucose, Bld: 100 mg/dL (ref 65–139)
Potassium: 4.8 mmol/L (ref 3.5–5.3)
Sodium: 142 mmol/L (ref 135–146)

## 2019-11-29 NOTE — Progress Notes (Signed)
Creatinine is better.

## 2019-12-25 ENCOUNTER — Ambulatory Visit: Payer: Medicare HMO | Admitting: Rheumatology

## 2020-01-02 IMAGING — MR MR CERVICAL SPINE W/O CM
4 of 5 series · 14 of 48 positions shown · non-contrast
Comparison: Cervical spine radiographs 08/20/2017.

CLINICAL DATA: 72-year-old female with neck pain radiating to the
left arm for 3 weeks. No known injury.

EXAM:
MRI CERVICAL SPINE WITHOUT CONTRAST
TECHNIQUE: Multiplanar, multisequence MR imaging of the cervical spine was
performed. No intravenous contrast was administered.

[Series 3: T2 · sagittal · 3.0mm · 0.38mm/px · 5 of 13 slices shown (1 of 2)]
[im 1/13]
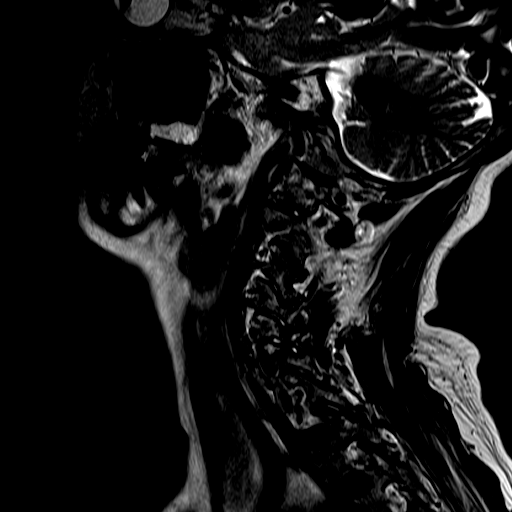
[im 4/13]
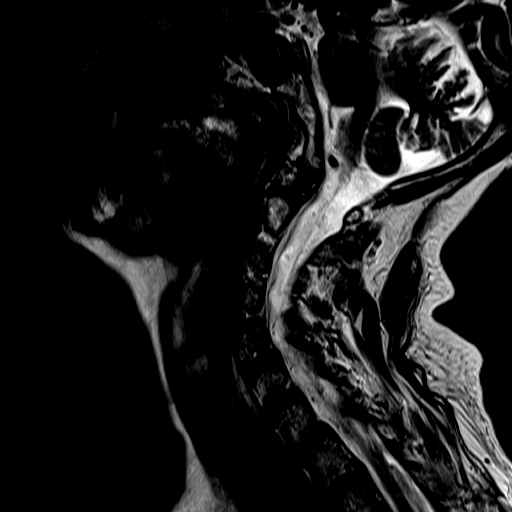
[im 7/13]
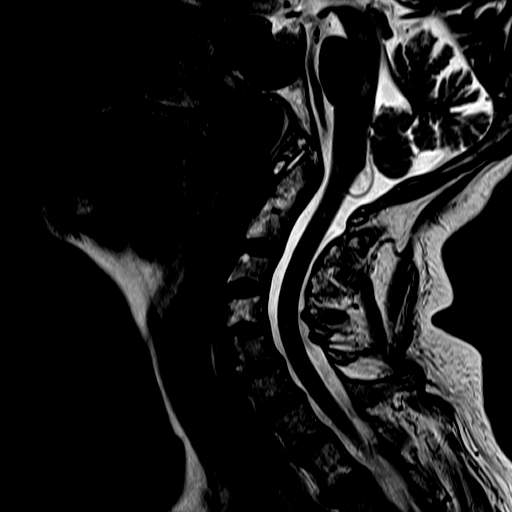
[im 10/13]
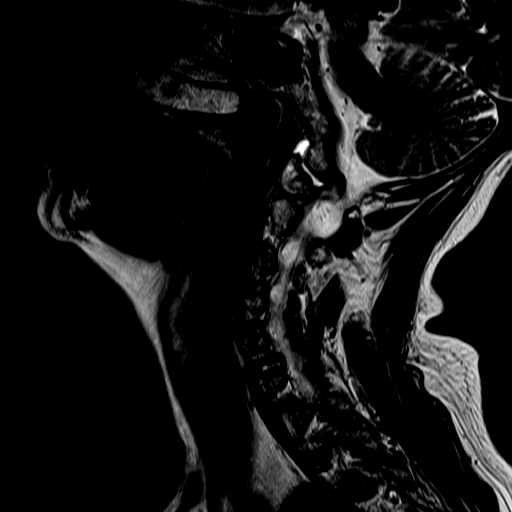
[im 13/13]
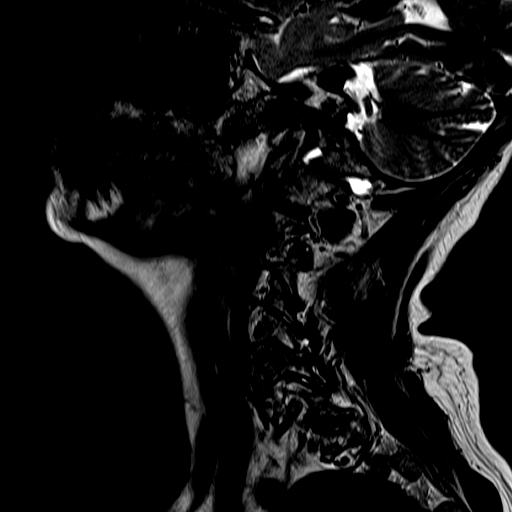

[Series 4: FLAIR · sagittal · 3.0mm · 0.44mm/px · 3 of 13 slices shown]
[im 1/13]
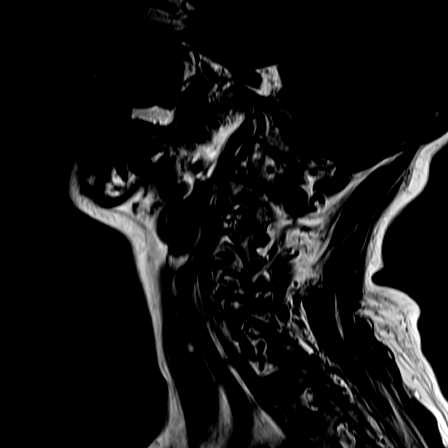
[im 7/13]
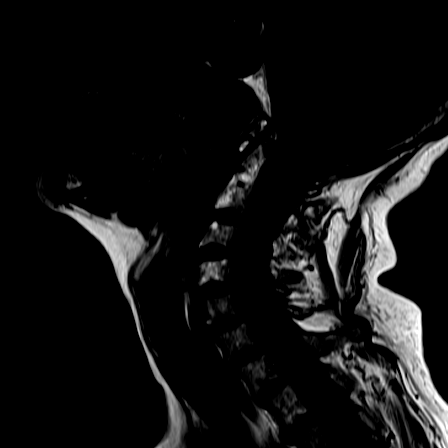
[im 13/13]
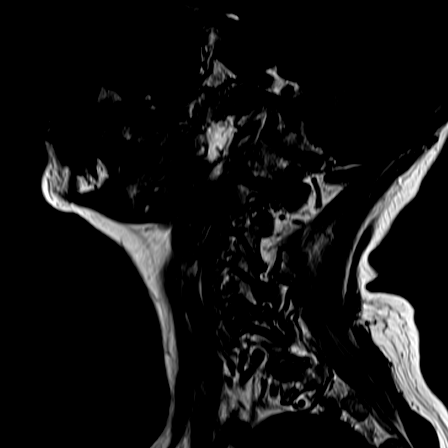

[Series 5: ir sagital · sagittal · 3.0mm · 0.22mm/px · 3 of 13 slices shown]
[im 3/13]
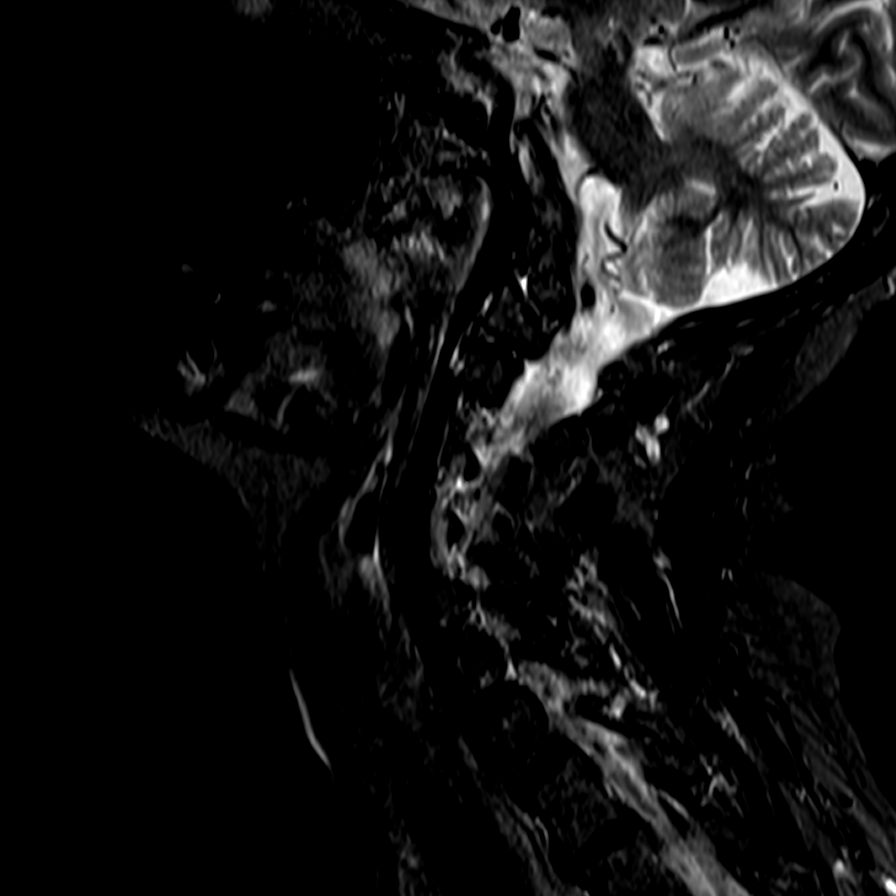
[im 8/13]
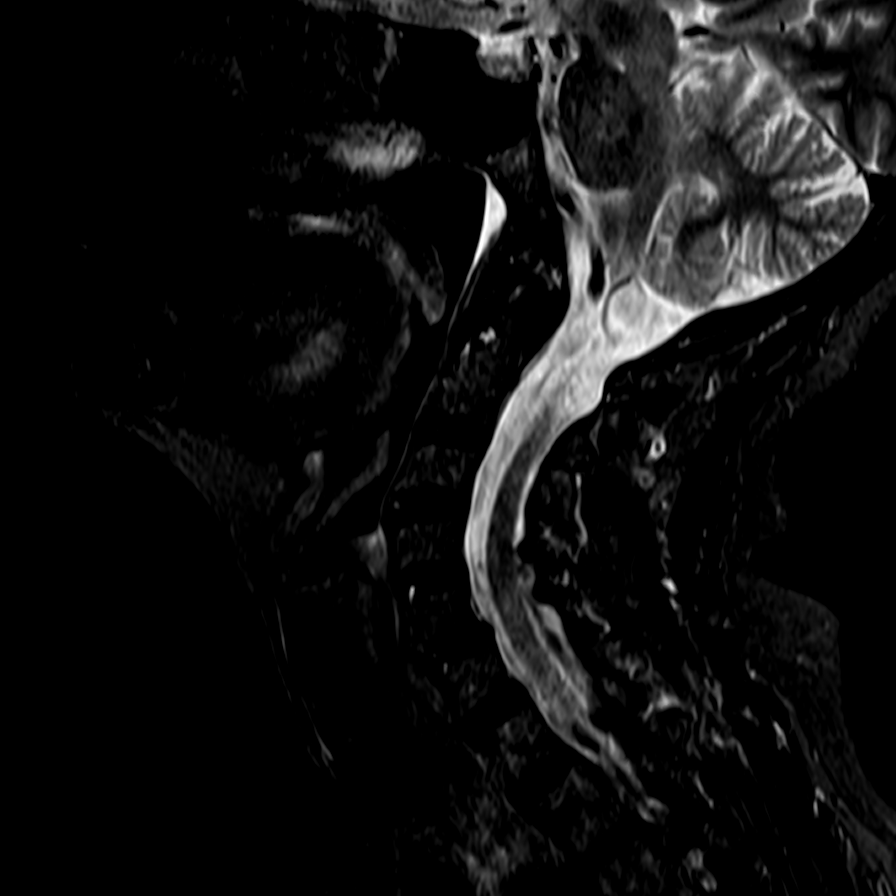
[im 13/13]
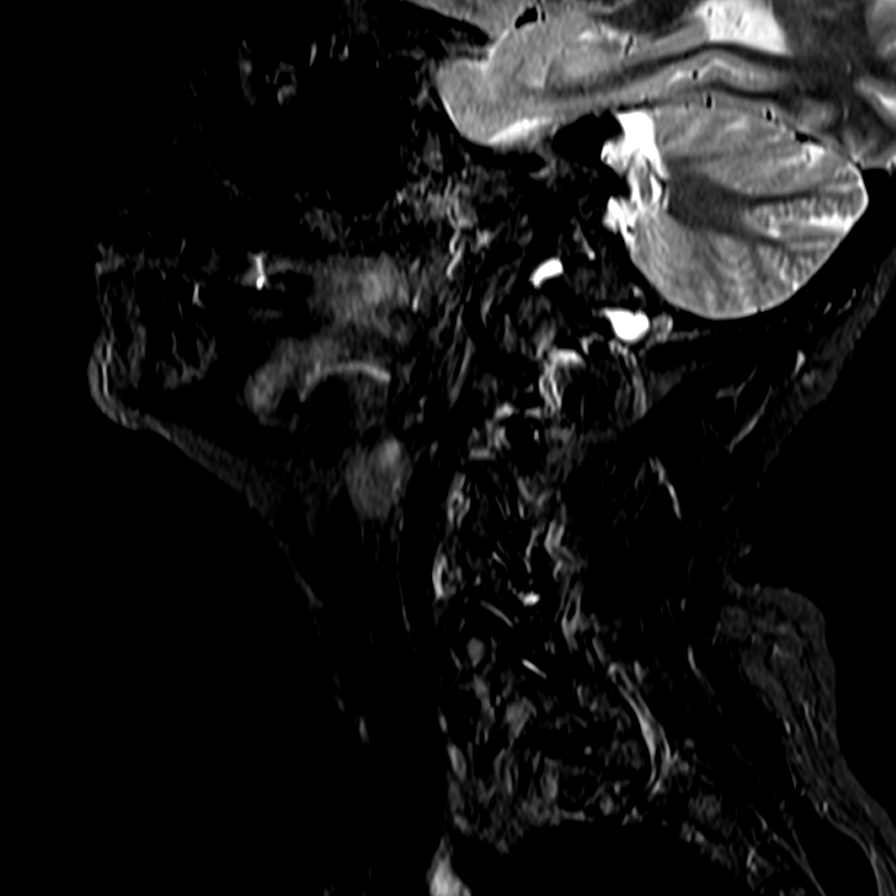

[Series 7: T2 · axial · 3.0mm · 0.23mm/px · z∈[-77,+1]mm · 3 of 36 slices shown (2 of 2)]
[im 5/36]
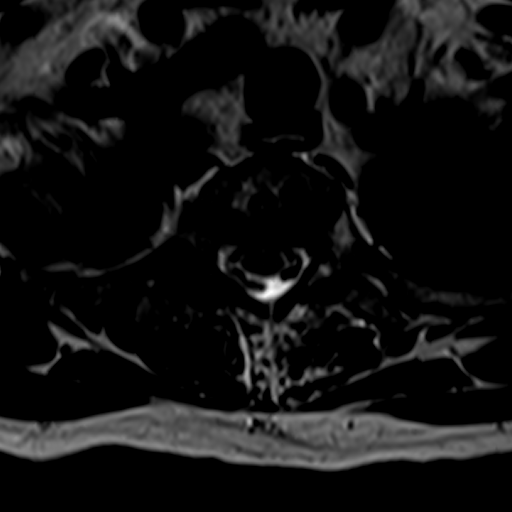
[im 19/36]
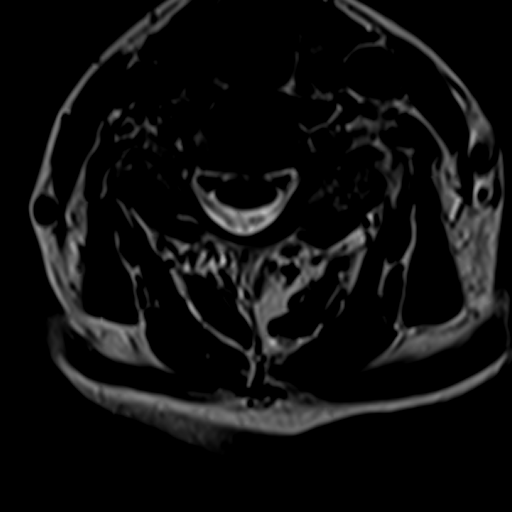
[im 31/36]
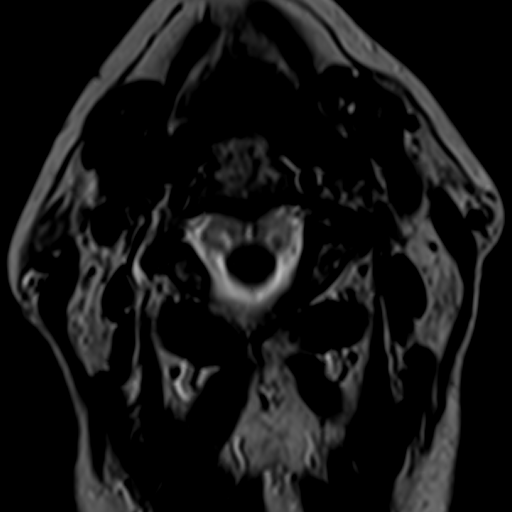

[14 of 48 positions shown; findings below may reference images not displayed]

FINDINGS: Alignment: Stable exaggerated cervical lordosis. Subtle
anterolisthesis throughout the cervical spine appears stable and
degenerative in nature.

Vertebrae: No marrow edema or evidence of acute osseous abnormality.
Mildly heterogeneous background bone marrow signal. Degenerative
endplate marrow changes at T1-T2.

Cord: Spinal cord signal is within normal limits at all visualized
levels.

Posterior Fossa, vertebral arteries, paraspinal tissues:
Cervicomedullary junction is within normal limits. Negative visible
brain parenchyma. Preserved major vascular flow voids. Negative neck
soft tissues. Negative visible lung apices.

Disc levels:

C2-C3: Moderate left facet hypertrophy. Mild left C3 foraminal
stenosis.

C3-C4: Moderate facet hypertrophy greater on the left. Trace
degenerative facet joint fluid. Borderline to mild left C4 foraminal
stenosis.

C4-C5: Mild anterolisthesis at this level with moderate to severe
facet and ligament flavum hypertrophy greater on the left.
Degenerative facet joint fluid. No spinal stenosis. Borderline to
mild left C5 foraminal stenosis.

C5-C6: Anterolisthesis with moderate to severe facet and ligament
flavum hypertrophy greater on the left. Trace degenerative facet
joint fluid. Mild disc space loss and disc bulging. No spinal
stenosis. Mild bilateral C6 foraminal stenosis.

C6-C7: Moderate facet hypertrophy. Mild circumferential disc bulge.
No stenosis.

C7-T1: Moderate to severe facet hypertrophy greater on the right.
Mild disc bulge. Mild to moderate bilateral C8 foraminal stenosis.

T1-T2: Moderate facet hypertrophy greater on the right. Mild disc
bulge. Mild to moderate right T1 foraminal stenosis.
IMPRESSION: 1. Exaggerated cervical lordosis with widespread mild
anterolisthesis in the cervical spine. Generalized cervical facet
degeneration, up to severe. Comparatively mild disc degeneration,
primarily in the lower cervical spine.
2. No cervical spinal stenosis. Generally mild cervical neural
foraminal stenosis, up to moderate at the bilateral C8 nerve levels.
3. Disc and facet degeneration at T1-T2 with mild to moderate right
T1 foraminal stenosis.

## 2020-04-11 ENCOUNTER — Other Ambulatory Visit: Payer: Self-pay | Admitting: Physician Assistant

## 2020-04-11 NOTE — Telephone Encounter (Signed)
Next Visit: 05/03/2020  Last Visit: 11/02/2019   Last Fill: 11/02/2019  Dx: Trapezius muscle spasm   Current Dose per office note on 11/02/2019, Robaxin 500 mg 1 tablet daily PRN and zanaflex 4 mg at bedtime PRN for muscle spasms  Okay to refill Zanaflex and Robaxin?

## 2020-04-19 NOTE — Progress Notes (Signed)
Office Visit Note  Patient: Lauren Knox             Date of Birth: 15-Jul-1945           MRN: [id number]             PCP: Asencion Noble, MD Referring: Asencion Noble, MD Visit Date: 05/03/2020 Occupation: @GUAROCC @  Subjective:  Other (Patient reports back pain )   History of Present Illness: Lauren Knox is a 75 y.o. female with history of degenerative disc disease, osteoarthritis and osteoporosis.  She states she continues to have pain and discomfort in her neck upper back and lower back.  She states few weeks ago she was having neck and trapezius spasm.  The pain moved to the thoracic region and then lumbar region.  She has some discomfort in her hands and feet which is tolerable.  She had Reclast infusion last October and tolerated it well.  She has been taking calcium and vitamin D.  She likes gardening.  She states when she bends over it causes increased discomfort in her upper and lower back.  Activities of Daily Living:  Patient reports morning stiffness for 1-2 minutes.   Patient Reports nocturnal pain.  Difficulty dressing/grooming: Denies Difficulty climbing stairs: Denies Difficulty getting out of chair: Denies Difficulty using hands for taps, buttons, cutlery, and/or writing: Denies  Review of Systems  Constitutional: Positive for fatigue. Negative for night sweats, weight gain and weight loss.  HENT: Positive for mouth dryness and nose dryness. Negative for mouth sores, trouble swallowing and trouble swallowing.   Eyes: Positive for dryness. Negative for pain, redness, itching and visual disturbance.  Respiratory: Negative for cough, shortness of breath and difficulty breathing.   Cardiovascular: Negative for chest pain, palpitations, hypertension, irregular heartbeat and swelling in legs/feet.  Gastrointestinal: Negative for blood in stool, constipation and diarrhea.  Endocrine: Negative for increased urination.  Genitourinary: Negative for difficulty urinating  and vaginal dryness.  Musculoskeletal: Positive for arthralgias, joint pain, myalgias, morning stiffness, muscle tenderness and myalgias. Negative for joint swelling and muscle weakness.  Skin: Negative for color change, rash, hair loss, redness, skin tightness, ulcers and sensitivity to sunlight.  Allergic/Immunologic: Negative for susceptible to infections.  Neurological: Positive for memory loss and weakness. Negative for dizziness, numbness, headaches and night sweats.  Hematological: Positive for bruising/bleeding tendency. Negative for swollen glands.  Psychiatric/Behavioral: Negative for depressed mood, confusion and sleep disturbance. The patient is not nervous/anxious.     PMFS History:  Patient Active Problem List   Diagnosis Date Noted  . 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 ADD (attention deficit disorder) 01/27/2016  . Rosacea 01/27/2016  . Vitamin D deficiency 01/27/2016  . 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 Muscle weakness (generalized) 09/17/2013  . Scoliosis concern 09/17/2013  . Stiffness of joint, not elsewhere classified, pelvic region and thigh 09/17/2013  . Difficulty in walking(719.7) 09/17/2013    Past Medical History:  Diagnosis Date  . ADD (attention deficit disorder) 01/27/2016  . 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 Osteopenia   . Raynaud's disease   . 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 Rosacea 01/27/2016  . Varicose veins   . Vitamin D deficiency 01/27/2016    Family History  Problem Relation Age of Onset  . Cancer Mother   .  Cancer Father   . Heart attack Father   . Cancer Brother    Past Surgical History:  Procedure Laterality Date  . ABDOMINAL HYSTERECTOMY    . CESAREAN SECTION    . CHOLECYSTECTOMY     . INTESTINAL BLOCKAGE    . TONSILLECTOMY     Social History   Social History Narrative  . Not on file   Immunization History  Administered Date(s) Administered  . PFIZER(Purple Top)SARS-COV-2 Vaccination 02/21/2019, 03/14/2019, 11/19/2019     Objective: Vital Signs: BP (!) 154/79 (BP Location: Left Arm, Patient Position: Sitting, Cuff Size: Normal)   Pulse (!) 53   Resp 14   Ht 5\' 4"  (1.626 m)   Wt 144 lb 6.4 oz (65.5 kg)   BMI 24.79 kg/m    Physical Exam Vitals and nursing note reviewed.  Constitutional:      Appearance: She is well-developed.  HENT:     Head: Normocephalic and atraumatic.  Eyes:     Conjunctiva/sclera: Conjunctivae normal.  Cardiovascular:     Rate and Rhythm: Normal rate and regular rhythm.     Heart sounds: Normal heart sounds.  Pulmonary:     Effort: Pulmonary effort is normal.     Breath sounds: Normal breath sounds.  Abdominal:     General: Bowel sounds are normal.     Palpations: Abdomen is soft.  Musculoskeletal:     Cervical back: Normal range of motion.  Lymphadenopathy:     Cervical: No cervical adenopathy.  Skin:    General: Skin is warm and dry.     Capillary Refill: Capillary refill takes less than 2 seconds.  Neurological:     Mental Status: She is alert and oriented to person, place, and time.  Psychiatric:        Behavior: Behavior normal.      Musculoskeletal Exam: C-spine was in good range of motion.  She had bilateral trapezius spasm.  She is some discomfort with range of motion of thoracic and lumbar spine.  Shoulder joints, elbow joints, wrist joints with good range of motion.  She bilateral PIP and DIP thickening with no synovitis.  Hip joints and knee joints in good range of motion.  She had no tenderness over ankles or MTPs.  CDAI Exam: CDAI Score: -- Patient Global: --; Provider Global: -- Swollen: --; Tender: -- Joint Exam 05/03/2020   No joint exam has been documented for this visit   There is currently no  information documented on the homunculus. Go to the Rheumatology activity and complete the homunculus joint exam.  Investigation: No additional findings.  Imaging: No results found.  Recent Labs: Lab Results  Component Value Date   HGB 13.3 11/13/2019   NA 142 11/27/2019   K 4.8 11/27/2019   CL 106 11/27/2019   CO2 25 11/27/2019   GLUCOSE 100 11/27/2019   BUN 27 (H) 11/27/2019   CREATININE 0.98 (H) 11/27/2019   CALCIUM 9.9 11/27/2019   GFRAA 67 11/27/2019    Speciality Comments: No specialty comments available.  Procedures:  No procedures performed Allergies: Codeine and Lisinopril   Assessment / Plan:     Visit Diagnoses: Trapezius muscle spasm -she continues to have intermittent trapezius spasm.  She states she takes Robaxin only on as needed basis and Zanaflex at bedtime due to muscle spasms.  Side effects of muscle relaxers in elderly population was discussed.  Increased risk of falling was also discussed.  She states she takes Robaxin occasionally and when she takes Robaxin  she does not drive or go anywhere.  She does not get up at night after taking Zanaflex.  I also advised to reduce Celebrex to half a tablet if possible.  Patient states that will be difficult but she will try.  Robaxin 500 mg 1 tablet daily PRN and zanaflex 4 mg at bedtime PRN for muscle spasms. She has not had trigger point injections since June 2020.    DDD (degenerative disc disease), cervical-she continues to have some stiffness in her cervical spine.  I will refer her to physical therapy.  DDD (degenerative disc disease), thoracic-she has ongoing discomfort in the thoracic region.  I will refer her to physical therapy.  Postural kyphosis of thoracolumbar region  Spondylosis without myelopathy or radiculopathy, lumbar region - X-rays of the lumbar spine were obtained by Dr. Lorin Mercy on 01/17/17: Lumbar spondylosis with degenerative facet changes and mild disc space narrowing L3-S1.  She has been having  increased lower back pain recently.  Have given her a handout for back exercises and will refer her to physical therapy.  Primary osteoarthritis of both hands-she has severe osteoarthritis in her hands.  Joint protection muscle strengthening was discussed.  Primary osteoarthritis of both feet-proper fitting shoes were discussed.  Raynaud'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-currently not active.  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 IV Reclast 11/13/2019. previously treated with fosamax, which she could not tolerate, Reclast IV in 2018.  She was on a drug holiday and then received Reclast in 2021.  She should get repeat Reclast this year.  Vitamin D deficiency-she is on vitamin D supplement.  History of rosacea  History of attention deficit disorder  Orders: Orders Placed This Encounter  Procedures  . Ambulatory referral to Physical Therapy   No orders of the defined types were placed in this encounter.  .  Follow-Up Instructions: Return in about 6 months (around 11/02/2020) for Osteoarthritis.   Bo Merino, MD  Note - This record has been created using Editor, commissioning.  Chart creation errors have been sought, but may not always  have been located. Such creation errors do not reflect on  the standard of medical care.

## 2020-05-03 ENCOUNTER — Encounter: Payer: Self-pay | Admitting: Rheumatology

## 2020-05-03 ENCOUNTER — Ambulatory Visit: Payer: Medicare HMO | Admitting: Rheumatology

## 2020-05-03 ENCOUNTER — Other Ambulatory Visit: Payer: Self-pay

## 2020-05-03 VITALS — BP 154/79 | HR 53 | Resp 14 | Ht 64.0 in | Wt 144.4 lb

## 2020-05-03 DIAGNOSIS — E559 Vitamin D deficiency, unspecified: Secondary | ICD-10-CM

## 2020-05-03 DIAGNOSIS — M4005 Postural kyphosis, thoracolumbar region: Secondary | ICD-10-CM

## 2020-05-03 DIAGNOSIS — M5134 Other intervertebral disc degeneration, thoracic region: Secondary | ICD-10-CM | POA: Diagnosis not present

## 2020-05-03 DIAGNOSIS — M19041 Primary osteoarthritis, right hand: Secondary | ICD-10-CM

## 2020-05-03 DIAGNOSIS — M19042 Primary osteoarthritis, left hand: Secondary | ICD-10-CM

## 2020-05-03 DIAGNOSIS — M5030 Other cervical disc degeneration, unspecified cervical region: Secondary | ICD-10-CM | POA: Diagnosis not present

## 2020-05-03 DIAGNOSIS — M19071 Primary osteoarthritis, right ankle and foot: Secondary | ICD-10-CM

## 2020-05-03 DIAGNOSIS — M19072 Primary osteoarthritis, left ankle and foot: Secondary | ICD-10-CM

## 2020-05-03 DIAGNOSIS — Z8659 Personal history of other mental and behavioral disorders: Secondary | ICD-10-CM

## 2020-05-03 DIAGNOSIS — M62838 Other muscle spasm: Secondary | ICD-10-CM | POA: Diagnosis not present

## 2020-05-03 DIAGNOSIS — I73 Raynaud'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: Secondary | ICD-10-CM

## 2020-05-03 DIAGNOSIS — M47816 Spondylosis without myelopathy or radiculopathy, lumbar region: Secondary | ICD-10-CM

## 2020-05-03 DIAGNOSIS — M810 Age-related osteoporosis without current pathological fracture: Secondary | ICD-10-CM

## 2020-05-03 DIAGNOSIS — Z872 Personal history of diseases of the skin and subcutaneous tissue: Secondary | ICD-10-CM

## 2020-05-03 NOTE — Patient Instructions (Signed)
Cervical Strain and Sprain Rehab Ask your health care provider which exercises are safe for you. Do exercises exactly as told by your health care provider and adjust them as directed. It is normal to feel mild stretching, pulling, tightness, or discomfort as you do these exercises. Stop right away if you feel sudden pain or your pain gets worse. Do not begin these exercises until told by your health care provider. Stretching and range-of-motion exercises Cervical side bending 1. Using good posture, sit on a stable chair or stand up. 2. Without moving your shoulders, slowly tilt your left / right ear to your shoulder until you feel a stretch in the opposite side neck muscles. You should be looking straight ahead. 3. Hold for __________ seconds. 4. Repeat with the other side of your neck. Repeat __________ times. Complete this exercise __________ times a day.   Cervical rotation 1. Using good posture, sit on a stable chair or stand up. 2. Slowly turn your head to the side as if you are looking over your left / right shoulder. ? Keep your eyes level with the ground. ? Stop when you feel a stretch along the side and the back of your neck. 3. Hold for __________ seconds. 4. Repeat this by turning to your other side. Repeat __________ times. Complete this exercise __________ times a day.   Thoracic extension and pectoral stretch 1. Roll a towel or a small blanket so it is about 4 inches (10 cm) in diameter. 2. Lie down on your back on a firm surface. 3. Put the towel lengthwise, under your spine in the middle of your back. It should not be under your shoulder blades. The towel should line up with your spine from your middle back to your lower back. 4. Put your hands behind your head and let your elbows fall out to your sides. 5. Hold for __________ seconds. Repeat __________ times. Complete this exercise __________ times a day. Strengthening exercises Isometric upper cervical flexion 1. Lie on  your back with a thin pillow behind your head and a small rolled-up towel under your neck. 2. Gently tuck your chin toward your chest and nod your head down to look toward your feet. Do not lift your head off the pillow. 3. Hold for __________ seconds. 4. Release the tension slowly. Relax your neck muscles completely before you repeat this exercise. Repeat __________ times. Complete this exercise __________ times a day. Isometric cervical extension 1. Stand about 6 inches (15 cm) away from a wall, with your back facing the wall. 2. Place a soft object, about 6-8 inches (15-20 cm) in diameter, between the back of your head and the wall. A soft object could be a small pillow, a ball, or a folded towel. 3. Gently tilt your head back and press into the soft object. Keep your jaw and forehead relaxed. 4. Hold for __________ seconds. 5. Release the tension slowly. Relax your neck muscles completely before you repeat this exercise. Repeat __________ times. Complete this exercise __________ times a day.   Posture and body mechanics Body mechanics refers to the movements and positions of your body while you do your daily activities. Posture is part of body mechanics. Good posture and healthy body mechanics can help to relieve stress in your body's tissues and joints. Good posture means that your spine is in its natural S-curve position (your spine is neutral), your shoulders are pulled back slightly, and your head is not tipped forward. The following are general guidelines  for applying improved posture and body mechanics to your everyday activities. Sitting 1. When sitting, keep your spine neutral and keep your feet flat on the floor. Use a footrest, if necessary, and keep your thighs parallel to the floor. Avoid rounding your shoulders, and avoid tilting your head forward. 2. When working at a desk or a computer, keep your desk at a height where your hands are slightly lower than your elbows. Slide your  chair under your desk so you are close enough to maintain good posture. 3. When working at a computer, place your monitor at a height where you are looking straight ahead and you do not have to tilt your head forward or downward to look at the screen.   Standing  When standing, keep your spine neutral and keep your feet about hip-width apart. Keep a slight bend in your knees. Your ears, shoulders, and hips should line up.  When you do a task in which you stand in one place for a long time, place one foot up on a stable object that is 2-4 inches (5-10 cm) high, such as a footstool. This helps keep your spine neutral.   Resting When lying down and resting, avoid positions that are most painful for you. Try to support your neck in a neutral position. You can use a contour pillow or a small rolled-up towel. Your pillow should support your neck but not push on it. This information is not intended to replace advice given to you by your health care provider. Make sure you discuss any questions you have with your health care provider. Document Revised: 04/23/2018 Document Reviewed: 10/02/2017 Elsevier Patient Education  2021 [place]. Back Exercises The following exercises strengthen the muscles that help to support the trunk and back. They also help to keep the lower back flexible. Doing these exercises can help to prevent back pain or lessen existing pain.  If you have back pain or discomfort, try doing these exercises 2-3 times each day or as told by your health care provider.  As your pain improves, do them once each day, but increase the number of times that you repeat the steps for each exercise (do more repetitions).  To prevent the recurrence of back pain, continue to do these exercises once each day or as told by your health care provider. Do exercises exactly as told by your health care provider and adjust them as directed. It is normal to feel mild stretching, pulling, tightness, or  discomfort as you do these exercises, but you should stop right away if you feel sudden pain or your pain gets worse. Exercises Single knee to chest Repeat these steps 3-5 times for each leg: 5. Lie on your back on a firm bed or the floor with your legs extended. 6. Bring one knee to your chest. Your other leg should stay extended and in contact with the floor. 7. Hold your knee in place by grabbing your knee or thigh with both hands and hold. 8. Pull on your knee until you feel a gentle stretch in your lower back or buttocks. 9. Hold the stretch for 10-30 seconds. 10. Slowly release and straighten your leg. Pelvic tilt Repeat these steps 5-10 times: 6. Lie on your back on a firm bed or the floor with your legs extended. 7. Bend your knees so they are pointing toward the ceiling and your feet are flat on the floor. 8. Tighten your lower abdominal muscles to press your lower back against the  floor. This motion will tilt your pelvis so your tailbone points up toward the ceiling instead of pointing to your feet or the floor. 9. With gentle tension and even breathing, hold this position for 5-10 seconds. Cat-cow Repeat these steps until your lower back becomes more flexible: 5. Get into a hands-and-knees position on a firm surface. Keep your hands under your shoulders, and keep your knees under your hips. You may place padding under your knees for comfort. 6. Let your head hang down toward your chest. Contract your abdominal muscles and point your tailbone toward the floor so your lower back becomes rounded like the back of a cat. 7. Hold this position for 5 seconds. 8. Slowly lift your head, let your abdominal muscles relax and point your tailbone up toward the ceiling so your back forms a sagging arch like the back of a cow. 9. Hold this position for 5 seconds.   Press-ups Repeat these steps 5-10 times: 6. Lie on your abdomen (face-down) on the floor. 7. Place your palms near your head, about  shoulder-width apart. 8. Keeping your back as relaxed as possible and keeping your hips on the floor, slowly straighten your arms to raise the top half of your body and lift your shoulders. Do not use your back muscles to raise your upper torso. You may adjust the placement of your hands to make yourself more comfortable. 9. Hold this position for 5 seconds while you keep your back relaxed. 10. Slowly return to lying flat on the floor.   Bridges Repeat these steps 10 times: 4. Lie on your back on a firm surface. 5. Bend your knees so they are pointing toward the ceiling and your feet are flat on the floor. Your arms should be flat at your sides, next to your body. 6. Tighten your buttocks muscles and lift your buttocks off the floor until your waist is at almost the same height as your knees. You should feel the muscles working in your buttocks and the back of your thighs. If you do not feel these muscles, slide your feet 1-2 inches farther away from your buttocks. 7. Hold this position for 3-5 seconds. 8. Slowly lower your hips to the starting position, and allow your buttocks muscles to relax completely. If this exercise is too easy, try doing it with your arms crossed over your chest.   Abdominal crunches Repeat these steps 5-10 times: 1. Lie on your back on a firm bed or the floor with your legs extended. 2. Bend your knees so they are pointing toward the ceiling and your feet are flat on the floor. 3. Cross your arms over your chest. 4. Tip your chin slightly toward your chest without bending your neck. 5. Tighten your abdominal muscles and slowly raise your trunk (torso) high enough to lift your shoulder blades a tiny bit off the floor. Avoid raising your torso higher than that because it can put too much stress on your low back and does not help to strengthen your abdominal muscles. 6. Slowly return to your starting position. Back lifts Repeat these steps 5-10 times: 1. Lie on your  abdomen (face-down) with your arms at your sides, and rest your forehead on the floor. 2. Tighten the muscles in your legs and your buttocks. 3. Slowly lift your chest off the floor while you keep your hips pressed to the floor. Keep the back of your head in line with the curve in your back. Your eyes should be looking  at the floor. 4. Hold this position for 3-5 seconds. 5. Slowly return to your starting position. Contact a health care provider if:  Your back pain or discomfort gets much worse when you do an exercise.  Your worsening back pain or discomfort does not lessen within 2 hours after you exercise. If you have any of these problems, stop doing these exercises right away. Do not do them again unless your health care provider says that you can. Get help right away if:  You develop sudden, severe back pain. If this happens, stop doing the exercises right away. Do not do them again unless your health care provider says that you can. This information is not intended to replace advice given to you by your health care provider. Make sure you discuss any questions you have with your health care provider. Document Revised: 05/08/2018 Document Reviewed: 10/03/2017 Elsevier Patient Education  [place].

## 2020-05-24 ENCOUNTER — Ambulatory Visit (HOSPITAL_COMMUNITY): Payer: Medicare HMO | Attending: Rheumatology | Admitting: Physical Therapy

## 2020-05-24 ENCOUNTER — Encounter (HOSPITAL_COMMUNITY): Payer: Self-pay | Admitting: Physical Therapy

## 2020-05-24 ENCOUNTER — Other Ambulatory Visit: Payer: Self-pay

## 2020-05-24 DIAGNOSIS — M542 Cervicalgia: Secondary | ICD-10-CM | POA: Diagnosis not present

## 2020-05-24 DIAGNOSIS — M545 Low back pain, unspecified: Secondary | ICD-10-CM

## 2020-05-24 DIAGNOSIS — M546 Pain in thoracic spine: Secondary | ICD-10-CM | POA: Diagnosis present

## 2020-05-24 DIAGNOSIS — R293 Abnormal posture: Secondary | ICD-10-CM | POA: Diagnosis present

## 2020-05-24 NOTE — Patient Instructions (Signed)
Access Code: [email or web address] URL: [email or web address] Date: 05/24/2020 Prepared by: Josue Hector  Exercises Cervical Retraction at Wall - 3 x daily - 7 x weekly - 2 sets - 10 reps - 3 seconds hold Seated Scapular Retraction - 3 x daily - 7 x weekly - 2 sets - 10 reps - 3 seconds hold Seated Gentle Upper Trapezius Stretch - 3 x daily - 7 x weekly - 1 sets - 3 reps - 30 seconds hold

## 2020-05-24 NOTE — Therapy (Signed)
[place], [place], [address] Phone: [phone]   Fax:  [phone]  Physical Therapy Evaluation  Patient Details  Name: Lauren Knox MRN: [id number] Date of Birth: 1945/05/12 Referring Provider (PT): Bo Merino MD   Encounter Date: 05/24/2020   PT End of Session - 05/24/20 1201    Visit Number 1    Number of Visits 8    Date for PT Re-Evaluation 06/20/20    Authorization Type [employer] Medicare    Progress Note Due on Visit 8    PT Start Time 1115    PT Stop Time 1200    PT Time Calculation (min) 45 min    Activity Tolerance Patient tolerated treatment well    Behavior During Therapy [facility] for tasks assessed/performed           Past Medical History:  Diagnosis Date  . ADD (attention deficit disorder) 01/27/2016  . 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 Osteopenia   . Raynaud's disease   . 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 Rosacea 01/27/2016  . Varicose veins   . Vitamin D deficiency 01/27/2016    Past Surgical History:  Procedure Laterality Date  . ABDOMINAL HYSTERECTOMY    . CESAREAN SECTION    . CHOLECYSTECTOMY    . INTESTINAL BLOCKAGE    . TONSILLECTOMY      There were no vitals filed for this visit.    Subjective Assessment - 05/24/20 1128    Subjective Patient presents to therapy with complaint of arthritis in her back. She says it has moved around. She has muscle spasms, feels like this is related to reaching. She says she walks 2 miles a day and swims to stay activity. She feels she needs to make her back stronger. Imaging reports form patient chart detail lumbar spina bifida, and anterolisthesis in cervical spine. Patient says she is unaware of this as it has not really been explained to her.    Pertinent History arthritis, osteoporosis, spina bifida    Limitations  Standing;Walking;House hold activities    Patient Stated Goals Get a stronger back and neck    Currently in Pain? No/denies              [facility] PT Assessment - 05/24/20 0001      Assessment   Medical Diagnosis lumbar/ thoracic and cervical DDD    Referring Provider (PT) Bo Merino MD    Prior Therapy Yes      Balance Screen   Has the patient fallen in the past 6 months No      [place] residence    Living Arrangements Spouse/significant other      Prior Function   Level of Independence Independent      Cognition   Overall Cognitive Status Within Functional Limits for tasks assessed      Observation/Other Assessments   Focus on Therapeutic Outcomes (FOTO)  60% function      Posture/Postural Control   Posture/Postural Control Postural limitations    Postural Limitations Rounded Shoulders   scapular winging bilaterally     ROM / Strength   AROM / PROM / Strength AROM;Strength      AROM   Overall AROM Comments lumbar and bilateral shoulder mobility WFL, mod restriction in cervical rotation and sidebending      Strength   Strength Assessment Site Shoulder  Right/Left Shoulder Right;Left    Right Shoulder Flexion 4+/5    Right Shoulder ABduction 4+/5    Left Shoulder Flexion 4+/5    Left Shoulder ABduction 4+/5      Palpation   Palpation comment Mod TTP about bilateral upper trap, RT inferior scapula                      Objective measurements completed on examination: See above findings.       [facility] Adult PT Treatment/Exercise - 05/24/20 0001      Exercises   Exercises Shoulder;Neck      Shoulder Exercises: Seated   Other Seated Exercises chin tuck at wall, scapular retraction 10 x 5", UT stretch 2 x 30" each                  PT Education - 05/24/20 1130    Education Details on evaluation findings, POC and HEP    Person(s) Educated Patient    Methods Explanation;Handout     Comprehension Verbalized understanding            PT Short Term Goals - 05/24/20 1211      PT SHORT TERM GOAL #1   Title Patient will be independent with initial HEP and self-management strategies to improve functional outcomes    Time 2    Period Weeks    Status New    Target Date 06/07/20             PT Long Term Goals - 05/24/20 1211      PT LONG TERM GOAL #1   Title Patient will improve FOTO score by at least 5% to indicate improvement in functional outcomes    Time 4    Period Weeks    Status New    Target Date 06/21/20      PT LONG TERM GOAL #2   Title Patient will report at least 75% overall improvement in subjective complaint to indicate improvement in ability to perform ADLs.    Time 4    Period Weeks    Status New    Target Date 06/21/20      PT LONG TERM GOAL #3   Title Patient will be able to perform OH ADLs with no increased back pain for increased functional ability.    Time 4    Period Weeks    Status New    Target Date 06/21/20      PT LONG TERM GOAL #4   Title Patient will show improved postural awareness and be indpendant with advanced HEP for improved functional outcomes    Time 4    Period Weeks    Status New    Target Date 06/21/20                  Plan - 05/24/20 1209    Clinical Impression Statement Patient is a 75 y.o. female who presents to physical therapy with complaint of lumbar, thoracic and cervical pain. Patient demonstrates decreased strength, ROM restriction, reduced flexibility, increased tenderness to palpation and postural abnormalities which are likely contributing to symptoms of pain and are negatively impacting patient ability to perform ADLs. Patient will benefit from skilled physical therapy services to address these deficits to reduce pain and improve level of function with ADLs    Personal Factors and Comorbidities Comorbidity 2    Examination-Activity Limitations Reach Overhead;Carry;Lift     Examination-Participation Restrictions Cleaning;Community Activity;Yard Work    [employer]  Stable/Uncomplicated    Clinical Decision Making Low    Rehab Potential Good    PT Frequency 2x / week    PT Duration 4 weeks    PT Treatment/Interventions ADLs/Self Care Home Management;Aquatic Therapy;Biofeedback;Moist Heat;Stair training;Gait training;Functional mobility training;Traction;Ultrasound;Canalith Repostioning;Cryotherapy;Parrafin;Neuromuscular re-education;Manual techniques;Dry needling;Passive range of motion;Scar mobilization;Vasopneumatic Device;Vestibular;Joint Manipulations;Spinal Manipulations;Visual/perceptual remediation/compensation;Energy conservation;Manual lymph drainage;Splinting;Taping;Patient/family education;Orthotic Fit/Training;Compression bandaging;Balance training;Therapeutic exercise;Therapeutic activities;Fluidtherapy;Contrast Bath;Electrical Stimulation;Iontophoresis 4mg /ml Dexamethasone;DME Instruction    PT Next Visit Plan Goals review. Progress postural strengthening as tolerated. Add core and assess static balance next visit. Manual to cervical/ thoracic for pain as needed.    PT Home Exercise Plan Eval: chin tuck at wall, scap retraction, UT stretch    Consulted and Agree with Plan of Care Patient           Patient will benefit from skilled therapeutic intervention in order to improve the following deficits and impairments:  Pain,Improper body mechanics,Increased fascial restricitons,Impaired flexibility,Decreased range of motion,Decreased activity tolerance,Decreased strength,Hypomobility,Postural dysfunction  Visit Diagnosis: Cervicalgia  Low back pain, unspecified back pain laterality, unspecified chronicity, unspecified whether sciatica present  Pain in thoracic spine  Abnormal posture     Problem List Patient Active Problem List   Diagnosis Date Noted  . 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 Osteoarthritis of hands, 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   . 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 ADD (attention deficit disorder) 01/27/2016  . Rosacea 01/27/2016  . Vitamin D deficiency 01/27/2016  . 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 Muscle weakness (generalized) 09/17/2013  . Scoliosis concern 09/17/2013  . Stiffness of joint, not elsewhere classified, pelvic region and thigh 09/17/2013  . Difficulty in walking(719.7) 09/17/2013   12:15 PM, 05/24/20 Josue Hector PT DPT  Physical Therapist with [facility]  [phone] [place] [address] [place], [place], [address] Phone: [phone]   Fax:  [phone]  Name: Lauren Knox MRN: [id number] Date of Birth: 12-Dec-1945

## 2020-05-31 ENCOUNTER — Other Ambulatory Visit: Payer: Self-pay

## 2020-05-31 ENCOUNTER — Encounter (HOSPITAL_COMMUNITY): Payer: Self-pay | Admitting: Physical Therapy

## 2020-05-31 ENCOUNTER — Ambulatory Visit (HOSPITAL_COMMUNITY): Payer: Medicare HMO | Admitting: Physical Therapy

## 2020-05-31 DIAGNOSIS — M545 Low back pain, unspecified: Secondary | ICD-10-CM

## 2020-05-31 DIAGNOSIS — R293 Abnormal posture: Secondary | ICD-10-CM

## 2020-05-31 DIAGNOSIS — M542 Cervicalgia: Secondary | ICD-10-CM

## 2020-05-31 DIAGNOSIS — M546 Pain in thoracic spine: Secondary | ICD-10-CM

## 2020-05-31 NOTE — Patient Instructions (Signed)
Access Code: [email or web address] URL: [email or web address] Date: 05/31/2020 Prepared by: Josue Hector  Exercises Supine Transversus Abdominis Bracing - Hands on Stomach - 2 x daily - 7 x weekly - 2 sets - 10 reps - 5 seconds hold Standing Shoulder Row with Anchored Resistance - 2 x daily - 7 x weekly - 2 sets - 10 reps - 3 seconds hold Standing Shoulder Horizontal Abduction with Resistance - 2 x daily - 7 x weekly - 2 sets - 10 reps - seconds hold

## 2020-05-31 NOTE — Therapy (Signed)
[place], [place], [address] Phone: [phone]   Fax:  [phone]  Physical Therapy Treatment  Patient Details  Name: Lauren Knox MRN: [id number] Date of Birth: October 17, 1945 Referring Provider (PT): Bo Merino MD   Encounter Date: 05/31/2020   PT End of Session - 05/31/20 1040    Visit Number 2    Number of Visits 8    Date for PT Re-Evaluation 06/20/20    Authorization Type AETNA Medicare    Progress Note Due on Visit 8    PT Start Time 1035    PT Stop Time 1115    PT Time Calculation (min) 40 min    Activity Tolerance Patient tolerated treatment well    Behavior During Therapy [facility] for tasks assessed/performed           Past Medical History:  Diagnosis Date  . ADD (attention deficit disorder) 01/27/2016  . 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 Osteopenia   . Raynaud's disease   . 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 Rosacea 01/27/2016  . Varicose veins   . Vitamin D deficiency 01/27/2016    Past Surgical History:  Procedure Laterality Date  . ABDOMINAL HYSTERECTOMY    . CESAREAN SECTION    . CHOLECYSTECTOMY    . INTESTINAL BLOCKAGE    . TONSILLECTOMY      There were no vitals filed for this visit.   Subjective Assessment - 05/31/20 1040    Subjective Patient says HEP is going pretty good. Says her pain feels different form last week, but notes that the spasms "haven't started back".    Pertinent History arthritis, osteoporosis, spina bifida    Limitations Standing;Walking;House hold activities    Patient Stated Goals Get a stronger back and neck    Currently in Pain? No/denies              [facility] PT Assessment - 05/31/20 0001      Balance   Balance Assessed Yes      Static Standing Balance   Static Standing Balance -  Activities  Tandam Stance - Right Leg;Tandam Stance -  Left Leg    Static Standing - Comment/# of Minutes <10 seconds bilaterally                         [facility] Adult PT Treatment/Exercise - 05/31/20 0001      Neck Exercises: Supine   Other Supine Exercise chin tuck 10 x 5"      Shoulder Exercises: Supine   Other Supine Exercises scapular retraction 10 x 5"    Other Supine Exercises ab brace 20 x 5", LTR 5 x 10"      Shoulder Exercises: Standing   ABduction 15 reps    Theraband Level (Shoulder ABduction) Level 2 (Red)    Row Theraband;15 reps    Theraband Level (Shoulder Row) Level 2 (Red)                    PT Short Term Goals - 05/24/20 1211      PT SHORT TERM GOAL #1   Title Patient will be independent with initial HEP and self-management strategies to improve functional outcomes    Time 2    Period Weeks    Status New    Target Date 06/07/20  PT Long Term Goals - 05/24/20 1211      PT LONG TERM GOAL #1   Title Patient will improve FOTO score by at least 5% to indicate improvement in functional outcomes    Time 4    Period Weeks    Status New    Target Date 06/21/20      PT LONG TERM GOAL #2   Title Patient will report at least 75% overall improvement in subjective complaint to indicate improvement in ability to perform ADLs.    Time 4    Period Weeks    Status New    Target Date 06/21/20      PT LONG TERM GOAL #3   Title Patient will be able to perform OH ADLs with no increased back pain for increased functional ability.    Time 4    Period Weeks    Status New    Target Date 06/21/20      PT LONG TERM GOAL #4   Title Patient will show improved postural awareness and be indpendant with advanced HEP for improved functional outcomes    Time 4    Period Weeks    Status New    Target Date 06/21/20                 Plan - 05/31/20 1430    Clinical Impression Statement Reviewed therapy goals and HEP. Patient tolerated session well overall today. Progressed core and  postural strengthening activity this date. Patient with significant difficulty activating transverse abdominus with max verbal cues and tactile input. Patient did well with all other exercise. Patient educated on purpose and function of all added activity today, and issued updated HEP handout. Patient will continue to benefit from skilled therapy services to reduce deficits and improve functional ability.    Personal Factors and Comorbidities Comorbidity 2    Examination-Activity Limitations Reach Overhead;Carry;Lift    Examination-Participation Restrictions Cleaning;Community Activity;Yard Work    Stability/Clinical Decision Making Stable/Uncomplicated    Rehab Potential Good    PT Frequency 2x / week    PT Duration 4 weeks    PT Treatment/Interventions ADLs/Self Care Home Management;Aquatic Therapy;Biofeedback;Moist Heat;Stair training;Gait training;Functional mobility training;Traction;Ultrasound;Canalith Repostioning;Cryotherapy;Parrafin;Neuromuscular re-education;Manual techniques;Dry needling;Passive range of motion;Scar mobilization;Vasopneumatic Device;Vestibular;Joint Manipulations;Spinal Manipulations;Visual/perceptual remediation/compensation;Energy conservation;Manual lymph drainage;Splinting;Taping;Patient/family education;Orthotic Fit/Training;Compression bandaging;Balance training;Therapeutic exercise;Therapeutic activities;Fluidtherapy;Contrast Bath;Electrical Stimulation;Iontophoresis 4mg /ml Dexamethasone;DME Instruction    PT Next Visit Plan Progress postural strengthening as tolerated. Manual to cervical/ thoracic for pain as needed. Add static balance with standing core/ postural strengthening    PT Home Exercise Plan Eval: chin tuck at wall, scap retraction, UT stretch 5/17 band row, horiz shoulder abduction, ab brace    Consulted and Agree with Plan of Care Patient           Patient will benefit from skilled therapeutic intervention in order to improve the following deficits  and impairments:  Pain,Improper body mechanics,Increased fascial restricitons,Impaired flexibility,Decreased range of motion,Decreased activity tolerance,Decreased strength,Hypomobility,Postural dysfunction  Visit Diagnosis: Cervicalgia  Low back pain, unspecified back pain laterality, unspecified chronicity, unspecified whether sciatica present  Pain in thoracic spine  Abnormal posture     Problem List Patient Active Problem List   Diagnosis Date Noted  . 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 ADD (attention deficit disorder) 01/27/2016  . Rosacea 01/27/2016  . Vitamin D deficiency 01/27/2016  . 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 Muscle weakness (  generalized) 09/17/2013  . Scoliosis concern 09/17/2013  . Stiffness of joint, not elsewhere classified, pelvic region and thigh 09/17/2013  . Difficulty in walking(719.7) 09/17/2013   2:35 PM, 05/31/20 Josue Hector PT DPT  Physical Therapist with [facility]  [phone] [place] [address] [place], [place], [address] Phone: [phone]   Fax:  [phone]  Name: Lauren Knox MRN: [id number] Date of Birth: 09-23-1945

## 2020-06-02 ENCOUNTER — Encounter (HOSPITAL_COMMUNITY): Payer: Self-pay | Admitting: Physical Therapy

## 2020-06-02 ENCOUNTER — Other Ambulatory Visit: Payer: Self-pay

## 2020-06-02 ENCOUNTER — Ambulatory Visit (HOSPITAL_COMMUNITY): Payer: Medicare HMO | Admitting: Physical Therapy

## 2020-06-02 DIAGNOSIS — M542 Cervicalgia: Secondary | ICD-10-CM

## 2020-06-02 DIAGNOSIS — M546 Pain in thoracic spine: Secondary | ICD-10-CM

## 2020-06-02 DIAGNOSIS — R293 Abnormal posture: Secondary | ICD-10-CM

## 2020-06-02 DIAGNOSIS — M545 Low back pain, unspecified: Secondary | ICD-10-CM

## 2020-06-02 NOTE — Therapy (Signed)
[place], [place], [address] Phone: [phone]   Fax:  [phone]  Physical Therapy Treatment  Patient Details  Name: Lauren Knox MRN: [id number] Date of Birth: 1945-12-18 Referring Provider (PT): Bo Merino MD   Encounter Date: 06/02/2020   PT End of Session - 06/02/20 1002    Visit Number 3    Number of Visits 8    Date for PT Re-Evaluation 06/20/20    Authorization Type AET[employer] Medicare    Progress Note Due on Visit 8    PT Start Time 0955    PT Stop Time 1033    PT Time Calculation (min) 38 min    Activity Tolerance Patient tolerated treatment well    Behavior During Therapy [facility] for tasks assessed/performed           Past Medical History:  Diagnosis Date  . ADD (attention deficit disorder) 01/27/2016  . 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 Osteopenia   . Raynaud's disease   . 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 Rosacea 01/27/2016  . Varicose veins   . Vitamin D deficiency 01/27/2016    Past Surgical History:  Procedure Laterality Date  . ABDOMINAL HYSTERECTOMY    . CESAREAN SECTION    . CHOLECYSTECTOMY    . INTESTINAL BLOCKAGE    . TONSILLECTOMY      There were no vitals filed for this visit.   Subjective Assessment - 06/02/20 1001    Subjective Patient says she is doing alright. Having some trouble with ab bracing but other HEP going well.    Pertinent History arthritis, osteoporosis, spina bifida    Limitations Standing;Walking;House hold activities    Patient Stated Goals Get a stronger back and neck    Currently in Pain? No/denies                             [facility] Adult PT Treatment/Exercise - 06/02/20 0001      Neck Exercises: Supine   Other Supine Exercise chin tuck 10 x 5"      Shoulder Exercises: Supine   Other Supine Exercises scapular  retraction 10 x 5"    Other Supine Exercises ab brace 15 x 5", ab march x20      Shoulder Exercises: Standing   Horizontal ABduction 15 reps;Theraband    Theraband Level (Shoulder Horizontal ABduction) Level 2 (Red)    Row 15 reps;Theraband    Theraband Level (Shoulder Row) Level 2 (Red)                    PT Short Term Goals - 05/24/20 1211      PT SHORT TERM GOAL #1   Title Patient will be independent with initial HEP and self-management strategies to improve functional outcomes    Time 2    Period Weeks    Status New    Target Date 06/07/20             PT Long Term Goals - 05/24/20 1211      PT LONG TERM GOAL #1   Title Patient will improve FOTO score by at least 5% to indicate improvement in functional outcomes    Time 4    Period Weeks    Status New    Target Date 06/21/20      PT LONG TERM  GOAL #2   Title Patient will report at least 75% overall improvement in subjective complaint to indicate improvement in ability to perform ADLs.    Time 4    Period Weeks    Status New    Target Date 06/21/20      PT LONG TERM GOAL #3   Title Patient will be able to perform OH ADLs with no increased back pain for increased functional ability.    Time 4    Period Weeks    Status New    Target Date 06/21/20      PT LONG TERM GOAL #4   Title Patient will show improved postural awareness and be indpendant with advanced HEP for improved functional outcomes    Time 4    Period Weeks    Status New    Target Date 06/21/20                 Plan - 06/02/20 1046    Clinical Impression Statement Patient tolerated session well today. Notes some tension in shoulder with supine chin tuck. Cued patient to relax shoulders and avoid shoulder elevation which improved symptoms. Patient continues to have difficulty with ab bracing but showed significant improvement with verbal and tactile cueing. Also educated patient on anatomy, and function of transverse abdominus with  visual reference. Patient cued on arm position with horizontal shoulder abduction for improved target muscle activation. Patient will continue to benefit from skilled therapy services to progress postural strengthening for reduced pain and improved functional ability.    Personal Factors and Comorbidities Comorbidity 2    Examination-Activity Limitations Reach Overhead;Carry;Lift    Examination-Participation Restrictions Cleaning;Community Activity;Yard Work    Stability/Clinical Decision Making Stable/Uncomplicated    Rehab Potential Good    PT Frequency 2x / week    PT Duration 4 weeks    PT Treatment/Interventions ADLs/Self Care Home Management;Aquatic Therapy;Biofeedback;Moist Heat;Stair training;Gait training;Functional mobility training;Traction;Ultrasound;Canalith Repostioning;Cryotherapy;Parrafin;Neuromuscular re-education;Manual techniques;Dry needling;Passive range of motion;Scar mobilization;Vasopneumatic Device;Vestibular;Joint Manipulations;Spinal Manipulations;Visual/perceptual remediation/compensation;Energy conservation;Manual lymph drainage;Splinting;Taping;Patient/family education;Orthotic Fit/Training;Compression bandaging;Balance training;Therapeutic exercise;Therapeutic activities;Fluidtherapy;Contrast Bath;Electrical Stimulation;Iontophoresis 4mg /ml Dexamethasone;DME Instruction    PT Next Visit Plan Progress postural strengthening as tolerated. Manual to cervical/ thoracic for pain as needed. Add static balance with standing core/ postural strengthening    PT Home Exercise Plan Eval: chin tuck at wall, scap retraction, UT stretch 5/17 band row, horiz shoulder abduction, ab brace    Consulted and Agree with Plan of Care Patient           Patient will benefit from skilled therapeutic intervention in order to improve the following deficits and impairments:  Pain,Improper body mechanics,Increased fascial restricitons,Impaired flexibility,Decreased range of motion,Decreased  activity tolerance,Decreased strength,Hypomobility,Postural dysfunction  Visit Diagnosis: Cervicalgia  Low back pain, unspecified back pain laterality, unspecified chronicity, unspecified whether sciatica present  Pain in thoracic spine  Abnormal posture     Problem List Patient Active Problem List   Diagnosis Date Noted  . 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 ADD (attention deficit disorder) 01/27/2016  . Rosacea 01/27/2016  . Vitamin D deficiency 01/27/2016  . 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 Muscle weakness (generalized) 09/17/2013  . Scoliosis concern 09/17/2013  . Stiffness of joint, not elsewhere classified, pelvic region and thigh 09/17/2013  . Difficulty in walking(719.7) 09/17/2013    10:48 AM, 06/02/20 Josue Hector PT DPT  Physical Therapist with [facility]  (  [place]) [place], [place], [address] Phone: [phone]   Fax:  [phone]  Name: Lauren Knox MRN: [id number] Date of Birth: [birth year]

## 2020-06-07 ENCOUNTER — Other Ambulatory Visit: Payer: Self-pay

## 2020-06-07 ENCOUNTER — Encounter (HOSPITAL_COMMUNITY): Payer: Self-pay | Admitting: Physical Therapy

## 2020-06-07 ENCOUNTER — Ambulatory Visit (HOSPITAL_COMMUNITY): Payer: Medicare HMO | Admitting: Physical Therapy

## 2020-06-07 DIAGNOSIS — M542 Cervicalgia: Secondary | ICD-10-CM | POA: Diagnosis not present

## 2020-06-07 DIAGNOSIS — M545 Low back pain, unspecified: Secondary | ICD-10-CM

## 2020-06-07 DIAGNOSIS — M546 Pain in thoracic spine: Secondary | ICD-10-CM

## 2020-06-07 DIAGNOSIS — R293 Abnormal posture: Secondary | ICD-10-CM

## 2020-06-07 NOTE — Patient Instructions (Signed)
Access Code: [email or web address] URL: [email or web address] Date: 06/07/2020 Prepared by: Josue Hector  Exercises Shoulder Extension with Resistance - 3 x daily - 7 x weekly - 2 sets - 10 reps

## 2020-06-07 NOTE — Therapy (Signed)
[place], [place], [address] Phone: [phone]   Fax:  [phone]  Physical Therapy Treatment  Patient Details  Name: Lauren Knox MRN: [id number] Date of Birth: 1945-06-12 Referring Provider (PT): Bo Merino MD   Encounter Date: 06/07/2020   PT End of Session - 06/07/20 1043    Visit Number 4    Number of Visits 8    Date for PT Re-Evaluation 06/20/20    Authorization Type [employer] Medicare    Progress Note Due on Visit 8    PT Start Time 1039    PT Stop Time 1117    PT Time Calculation (min) 38 min    Activity Tolerance Patient tolerated treatment well    Behavior During Therapy [facility] for tasks assessed/performed           Past Medical History:  Diagnosis Date  . ADD (attention deficit disorder) 01/27/2016  . 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 Osteopenia   . Raynaud's disease   . 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 Rosacea 01/27/2016  . Varicose veins   . Vitamin D deficiency 01/27/2016    Past Surgical History:  Procedure Laterality Date  . ABDOMINAL HYSTERECTOMY    . CESAREAN SECTION    . CHOLECYSTECTOMY    . INTESTINAL BLOCKAGE    . TONSILLECTOMY      There were no vitals filed for this visit.   Subjective Assessment - 06/07/20 1042    Subjective Patient says things are going well. No pain today.    Pertinent History arthritis, osteoporosis, spina bifida    Limitations Standing;Walking;House hold activities    Patient Stated Goals Get a stronger back and neck    Currently in Pain? No/denies                             [facility] Adult PT Treatment/Exercise - 06/07/20 0001      Shoulder Exercises: Seated   Other Seated Exercises chin tuck at wall 10x 5", scapular retraction 15 x 5", UT stretch 3 x 30" each, thoracic extension mob x10, standing shoulder flexion  with RTB abdiction x15      Shoulder Exercises: Standing   Horizontal ABduction 15 reps;Theraband    Theraband Level (Shoulder Horizontal ABduction) Level 2 (Red)    Extension 15 reps;Theraband    Theraband Level (Shoulder Extension) Level 3 (Green)    Row 15 reps;Theraband    Theraband Level (Shoulder Row) Level 3 Nyoka Cowden)                    PT Short Term Goals - 05/24/20 1211      PT SHORT TERM GOAL #1   Title Patient will be independent with initial HEP and self-management strategies to improve functional outcomes    Time 2    Period Weeks    Status New    Target Date 06/07/20             PT Long Term Goals - 05/24/20 1211      PT LONG TERM GOAL #1   Title Patient will improve FOTO score by at least 5% to indicate improvement in functional outcomes    Time 4    Period Weeks    Status New    Target Date 06/21/20      PT LONG TERM GOAL #  2   Title Patient will report at least 75% overall improvement in subjective complaint to indicate improvement in ability to perform ADLs.    Time 4    Period Weeks    Status New    Target Date 06/21/20      PT LONG TERM GOAL #3   Title Patient will be able to perform OH ADLs with no increased back pain for increased functional ability.    Time 4    Period Weeks    Status New    Target Date 06/21/20      PT LONG TERM GOAL #4   Title Patient will show improved postural awareness and be indpendant with advanced HEP for improved functional outcomes    Time 4    Period Weeks    Status New    Target Date 06/21/20                 Plan - 06/07/20 1329    Clinical Impression Statement Patient progressing well to therapy goals. Progressed band resistance with rows and added extensions for postural strength progressions. Patient cued on form with shoulder extension for target muscle activation and form with upper trapezius stretching. Issued updated HEP handout. Patient will continue to benefit from skilled therapy  services to reduce deficits and improve functional ability.    Personal Factors and Comorbidities Comorbidity 2    Examination-Activity Limitations Reach Overhead;Carry;Lift    Examination-Participation Restrictions Cleaning;Community Activity;Yard Work    Stability/Clinical Decision Making Stable/Uncomplicated    Rehab Potential Good    PT Frequency 2x / week    PT Duration 4 weeks    PT Treatment/Interventions ADLs/Self Care Home Management;Aquatic Therapy;Biofeedback;Moist Heat;Stair training;Gait training;Functional mobility training;Traction;Ultrasound;Canalith Repostioning;Cryotherapy;Parrafin;Neuromuscular re-education;Manual techniques;Dry needling;Passive range of motion;Scar mobilization;Vasopneumatic Device;Vestibular;Joint Manipulations;Spinal Manipulations;Visual/perceptual remediation/compensation;Energy conservation;Manual lymph drainage;Splinting;Taping;Patient/family education;Orthotic Fit/Training;Compression bandaging;Balance training;Therapeutic exercise;Therapeutic activities;Fluidtherapy;Contrast Bath;Electrical Stimulation;Iontophoresis 4mg /ml Dexamethasone;DME Instruction    PT Next Visit Plan Progress postural strengthening as tolerated. Manual to cervical/ thoracic for pain as needed. Add static balance    PT Home Exercise Plan Eval: chin tuck at wall, scap retraction, UT stretch 5/17 band row, horiz shoulder abduction, ab brace 5/24 shoulder extension    Consulted and Agree with Plan of Care Patient           Patient will benefit from skilled therapeutic intervention in order to improve the following deficits and impairments:  Pain,Improper body mechanics,Increased fascial restricitons,Impaired flexibility,Decreased range of motion,Decreased activity tolerance,Decreased strength,Hypomobility,Postural dysfunction  Visit Diagnosis: Cervicalgia  Low back pain, unspecified back pain laterality, unspecified chronicity, unspecified whether sciatica present  Pain in  thoracic spine  Abnormal posture     Problem List Patient Active Problem List   Diagnosis Date Noted  . 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 ADD (attention deficit disorder) 01/27/2016  . Rosacea 01/27/2016  . Vitamin D deficiency 01/27/2016  . 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 Muscle weakness (generalized) 09/17/2013  . Scoliosis concern 09/17/2013  . Stiffness of joint, not elsewhere classified, pelvic region and thigh 09/17/2013  . Difficulty in walking(719.7) 09/17/2013    1:31 PM, 06/07/20 Josue Hector PT DPT  Physical Therapist with [facility]  [phone]) [phone] [place] [address] [place], [place], [address] Phone: [phone]   Fax:  [phone]  Name: Lauren Knox MRN: [id number] Date of Birth: [birth year]

## 2020-06-09 ENCOUNTER — Other Ambulatory Visit: Payer: Self-pay

## 2020-06-09 ENCOUNTER — Encounter (HOSPITAL_COMMUNITY): Payer: Self-pay | Admitting: Physical Therapy

## 2020-06-09 ENCOUNTER — Ambulatory Visit (HOSPITAL_COMMUNITY): Payer: Medicare HMO | Admitting: Physical Therapy

## 2020-06-09 DIAGNOSIS — M546 Pain in thoracic spine: Secondary | ICD-10-CM

## 2020-06-09 DIAGNOSIS — R293 Abnormal posture: Secondary | ICD-10-CM

## 2020-06-09 DIAGNOSIS — M545 Low back pain, unspecified: Secondary | ICD-10-CM

## 2020-06-09 DIAGNOSIS — M542 Cervicalgia: Secondary | ICD-10-CM

## 2020-06-09 NOTE — Patient Instructions (Signed)
Access Code: [email or web address] URL: [email or web address] Date: 06/09/2020 Prepared by: [facility] Lauren Knox  Exercises Shoulder External Rotation and Scapular Retraction with Resistance - 1 x daily - 7 x weekly - 2 sets - 10 reps

## 2020-06-09 NOTE — Therapy (Signed)
[place], [place], [address] Phone: [phone]   Fax:  [phone]  Physical Therapy Treatment  Patient Details  Name: Lauren Knox MRN: [id number] Date of Birth: [birth year] Referring Provider (PT): Bo Merino MD   Encounter Date: 06/09/2020   PT End of Session - 06/09/20 1006    Visit Number 5    Number of Visits 8    Date for PT Re-Evaluation 06/20/20    Authorization Type [employer] Medicare    Progress Note Due on Visit 8    PT Start Time 1006    PT Stop Time 1044    PT Time Calculation (min) 38 min    Activity Tolerance Patient tolerated treatment well    Behavior During Therapy [facility] for tasks assessed/performed           Past Medical History:  Diagnosis Date  . ADD (attention deficit disorder) 01/27/2016  . 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 Osteopenia   . Raynaud's disease   . 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 Rosacea 01/27/2016  . Varicose veins   . Vitamin D deficiency 01/27/2016    Past Surgical History:  Procedure Laterality Date  . ABDOMINAL HYSTERECTOMY    . CESAREAN SECTION    . CHOLECYSTECTOMY    . INTESTINAL BLOCKAGE    . TONSILLECTOMY      There were no vitals filed for this visit.   Subjective Assessment - 06/09/20 1007    Subjective Patient states she has been feeling good. She has not been having back spasms or set it off lately. Symptoms may be doing better with standing and chores as well.    Pertinent History arthritis, osteoporosis, spina bifida    Limitations Standing;Walking;House hold activities    Patient Stated Goals Get a stronger back and neck    Currently in Pain? No/denies                             [facility] Adult PT Treatment/Exercise - 06/09/20 0001      Shoulder Exercises: Supine   Other Supine Exercises Shoulder PNF D2  bilateral with red band 2x 10      Shoulder Exercises: Standing   Other Standing Exercises scapular retraction with GH ER at wall 2x 10 red band    Other Standing Exercises tandem stance 3x 30 second holds bilateral, shoulder extension with green band with TRA activation and marching 3x 10 bilateral                  PT Education - 06/09/20 1010    Education Details HEP, exercise mechanics    Person(s) Educated Patient    Methods Explanation;Demonstration    Comprehension Verbalized understanding;Returned demonstration            PT Short Term Goals - 05/24/20 1211      PT SHORT TERM GOAL #1   Title Patient will be independent with initial HEP and self-management strategies to improve functional outcomes    Time 2    Period Weeks    Status New    Target Date 06/07/20             PT Long Term Goals - 05/24/20 1211      PT LONG TERM GOAL #1   Title Patient will improve FOTO score by at least 5% to  indicate improvement in functional outcomes    Time 4    Period Weeks    Status New    Target Date 06/21/20      PT LONG TERM GOAL #2   Title Patient will report at least 75% overall improvement in subjective complaint to indicate improvement in ability to perform ADLs.    Time 4    Period Weeks    Status New    Target Date 06/21/20      PT LONG TERM GOAL #3   Title Patient will be able to perform OH ADLs with no increased back pain for increased functional ability.    Time 4    Period Weeks    Status New    Target Date 06/21/20      PT LONG TERM GOAL #4   Title Patient will show improved postural awareness and be indpendant with advanced HEP for improved functional outcomes    Time 4    Period Weeks    Status New    Target Date 06/21/20                 Plan - 06/09/20 1007    Clinical Impression Statement Continued with resisted postural strengthening without c/o increase in symptoms. Patient completes scap retraction with glenohumeral ER  exercise at wall with cueing for posture and core activation. Patient performs PNF D2 pattern in supine today with good mechanics following initial cueing. Patient with moderate sway with tandem stance with greater sway with LLE posterior. Patient balance improves with repetition and is able to maintain tandem stance for 30 seconds without UE support. Patient will continue to benefit from skilled physical therapy in order to reduce impairment and improve function.    Personal Factors and Comorbidities Comorbidity 2    Examination-Activity Limitations Reach Overhead;Carry;Lift    Examination-Participation Restrictions Cleaning;Community Activity;Yard Work    Stability/Clinical Decision Making Stable/Uncomplicated    Rehab Potential Good    PT Frequency 2x / week    PT Duration 4 weeks    PT Treatment/Interventions ADLs/Self Care Home Management;Aquatic Therapy;Biofeedback;Moist Heat;Stair training;Gait training;Functional mobility training;Traction;Ultrasound;Canalith Repostioning;Cryotherapy;Parrafin;Neuromuscular re-education;Manual techniques;Dry needling;Passive range of motion;Scar mobilization;Vasopneumatic Device;Vestibular;Joint Manipulations;Spinal Manipulations;Visual/perceptual remediation/compensation;Energy conservation;Manual lymph drainage;Splinting;Taping;Patient/family education;Orthotic Fit/Training;Compression bandaging;Balance training;Therapeutic exercise;Therapeutic activities;Fluidtherapy;Contrast Bath;Electrical Stimulation;Iontophoresis 93m/ml Dexamethasone;DME Instruction    PT Next Visit Plan Progress postural strengthening as tolerated. Manual to cervical/ thoracic for pain as needed. continue with balance    PT Home Exercise Plan Eval: chin tuck at wall, scap retraction, UT stretch 5/17 band row, horiz shoulder abduction, ab brace 5/24 shoulder extension 5/26 scap ret [facility], shoulder PNF d2 in supine    Consulted and Agree with Plan of Care Patient           Patient  will benefit from skilled therapeutic intervention in order to improve the following deficits and impairments:  Pain,Improper body mechanics,Increased fascial restricitons,Impaired flexibility,Decreased range of motion,Decreased activity tolerance,Decreased strength,Hypomobility,Postural dysfunction  Visit Diagnosis: Cervicalgia  Low back pain, unspecified back pain laterality, unspecified chronicity, unspecified whether sciatica present  Pain in thoracic spine  Abnormal posture     Problem List Patient Active Problem List   Diagnosis Date Noted  . 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 ADD (attention deficit disorder) 01/27/2016  . Rosacea 01/27/2016  . Vitamin D deficiency 01/27/2016  . 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 Muscle weakness (generalized) 09/17/2013  .  Scoliosis concern 09/17/2013  . Stiffness of joint, not elsewhere classified, pelvic region and thigh 09/17/2013  . Difficulty in walking(719.7) 09/17/2013    10:47 AM, 06/09/20 Mearl Latin PT, DPT Physical Therapist at [place], [place], [address] Phone: [phone]   Fax:  [phone]  Name: Lauren Knox MRN: [id number] Date of Birth: 1945-08-27

## 2020-06-14 ENCOUNTER — Ambulatory Visit (HOSPITAL_COMMUNITY): Payer: Medicare HMO | Admitting: Physical Therapy

## 2020-06-14 ENCOUNTER — Encounter (HOSPITAL_COMMUNITY): Payer: Self-pay | Admitting: Physical Therapy

## 2020-06-14 ENCOUNTER — Other Ambulatory Visit: Payer: Self-pay

## 2020-06-14 DIAGNOSIS — M542 Cervicalgia: Secondary | ICD-10-CM

## 2020-06-14 DIAGNOSIS — R293 Abnormal posture: Secondary | ICD-10-CM

## 2020-06-14 DIAGNOSIS — M546 Pain in thoracic spine: Secondary | ICD-10-CM

## 2020-06-14 DIAGNOSIS — M545 Low back pain, unspecified: Secondary | ICD-10-CM

## 2020-06-14 NOTE — Therapy (Signed)
[place], [place], [address] Phone: [phone]   Fax:  [phone]  Physical Therapy Treatment  Patient Details  Name: Lauren Knox MRN: [id number] Date of Birth: [birth year] Referring Provider (PT): Bo Merino MD   Encounter Date: 06/14/2020   PT End of Session - 06/14/20 0956    Visit Number 6    Number of Visits 8    Date for PT Re-Evaluation 06/20/20    Authorization Type [employer] Medicare    Progress Note Due on Visit 8    PT Start Time [id number]    PT Stop Time 1031    PT Time Calculation (min) 38 min    Activity Tolerance Patient tolerated treatment well    Behavior During Therapy [facility] for tasks assessed/performed           Past Medical History:  Diagnosis Date  . ADD (attention deficit disorder) 01/27/2016  . 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 Osteopenia   . Raynaud's disease   . 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 Rosacea 01/27/2016  . Varicose veins   . Vitamin D deficiency 01/27/2016    Past Surgical History:  Procedure Laterality Date  . ABDOMINAL HYSTERECTOMY    . CESAREAN SECTION    . CHOLECYSTECTOMY    . INTESTINAL BLOCKAGE    . TONSILLECTOMY      There were no vitals filed for this visit.   Subjective Assessment - 06/14/20 0955    Subjective Patient states "so far so good".    Pertinent History arthritis, osteoporosis, spina bifida    Limitations Standing;Walking;House hold activities    Patient Stated Goals Get a stronger back and neck    Currently in Pain? No/denies                             [facility] Adult PT Treatment/Exercise - 06/14/20 0001      Shoulder Exercises: Seated   Retraction 15 reps    Other Seated Exercises thoracic seated extension x10      Shoulder Exercises: Standing   Other Standing Exercises scapular retraction with GH ER at  wall 2x 10 red band, RTB horix abduction x 20, RTB X abduction x20 each    Other Standing Exercises GTB rows in semi tandem stance x20, shoulder extension GTB with alternating march x20      Neck Exercises: Stretches   Upper Trapezius Stretch Right;Left;3 reps;30 seconds                    PT Short Term Goals - 05/24/20 1211      PT SHORT TERM GOAL #1   Title Patient will be independent with initial HEP and self-management strategies to improve functional outcomes    Time 2    Period Weeks    Status New    Target Date 06/07/20             PT Long Term Goals - 05/24/20 1211      PT LONG TERM GOAL #1   Title Patient will improve FOTO score by at least 5% to indicate improvement in functional outcomes    Time 4    Period Weeks    Status New    Target Date 06/21/20      PT LONG TERM GOAL #2   Title Patient will report at  least 75% overall improvement in subjective complaint to indicate improvement in ability to perform ADLs.    Time 4    Period Weeks    Status New    Target Date 06/21/20      PT LONG TERM GOAL #3   Title Patient will be able to perform OH ADLs with no increased back pain for increased functional ability.    Time 4    Period Weeks    Status New    Target Date 06/21/20      PT LONG TERM GOAL #4   Title Patient will show improved postural awareness and be indpendant with advanced HEP for improved functional outcomes    Time 4    Period Weeks    Status New    Target Date 06/21/20                 Plan - 06/14/20 1029    Clinical Impression Statement Patient tolerated session well today and reports no increased pain during session. Progressed scapular strengthening with added band X using GTB. Patient educated on proper form and function. Educated patient on therapy POC and purpose of ther ex for improving posture to reduce neck and back pain. Attempted to progress band ER to GTB but was too challenging. Patient will continue to benefit  from skilled therapy services to reduce deficits and improve functional ability.    Personal Factors and Comorbidities Comorbidity 2    Examination-Activity Limitations Reach Overhead;Carry;Lift    Examination-Participation Restrictions Cleaning;Community Activity;Yard Work    Stability/Clinical Decision Making Stable/Uncomplicated    Rehab Potential Good    PT Frequency 2x / week    PT Duration 4 weeks    PT Treatment/Interventions ADLs/Self Care Home Management;Aquatic Therapy;Biofeedback;Moist Heat;Stair training;Gait training;Functional mobility training;Traction;Ultrasound;Canalith Repostioning;Cryotherapy;Parrafin;Neuromuscular re-education;Manual techniques;Dry needling;Passive range of motion;Scar mobilization;Vasopneumatic Device;Vestibular;Joint Manipulations;Spinal Manipulations;Visual/perceptual remediation/compensation;Energy conservation;Manual lymph drainage;Splinting;Taping;Patient/family education;Orthotic Fit/Training;Compression bandaging;Balance training;Therapeutic exercise;Therapeutic activities;Fluidtherapy;Contrast Bath;Electrical Stimulation;Iontophoresis 83m/ml Dexamethasone;DME Instruction    PT Next Visit Plan Progress postural strengthening as tolerated. Manual to cervical/ thoracic for pain as needed. continue with balance    PT Home Exercise Plan Eval: chin tuck at wall, scap retraction, UT stretch 5/17 band row, horiz shoulder abduction, ab brace 5/24 shoulder extension 5/26 scap ret with GL[facility] CenterER, shoulder PNF d2 in supine    Consulted and Agree with Plan of Care Patient           Patient will benefit from skilled therapeutic intervention in order to improve the following deficits and impairments:  Pain,Improper body mechanics,Increased fascial restricitons,Impaired flexibility,Decreased range of motion,Decreased activity tolerance,Decreased strength,Hypomobility,Postural dysfunction  Visit Diagnosis: Cervicalgia  Low back pain, unspecified back pain laterality,  unspecified chronicity, unspecified whether sciatica present  Pain in thoracic spine  Abnormal posture     Problem List Patient Active Problem List   Diagnosis Date Noted  . 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 ADD (attention deficit disorder) 01/27/2016  . Rosacea 01/27/2016  . Vitamin D deficiency 01/27/2016  . 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 Muscle weakness (generalized) 09/17/2013  . Scoliosis concern 09/17/2013  . Stiffness of joint, not elsewhere classified, pelvic region and thigh 09/17/2013  . Difficulty in walking(719.7) 09/17/2013   10:33 AM, 06/14/20 CJosue HectorPT DPT  [facility] ([phone]  [facility]  [place], [place], [address] Phone: [phone]   Fax:  [phone]  Name: Lauren Knox MRN: [id number] Date of Birth: 11/26/1945

## 2020-06-16 ENCOUNTER — Encounter (HOSPITAL_COMMUNITY): Payer: Self-pay | Admitting: Physical Therapy

## 2020-06-16 ENCOUNTER — Other Ambulatory Visit: Payer: Self-pay

## 2020-06-16 ENCOUNTER — Ambulatory Visit (HOSPITAL_COMMUNITY): Payer: Medicare HMO | Attending: Rheumatology | Admitting: Physical Therapy

## 2020-06-16 DIAGNOSIS — M542 Cervicalgia: Secondary | ICD-10-CM

## 2020-06-16 DIAGNOSIS — M546 Pain in thoracic spine: Secondary | ICD-10-CM

## 2020-06-16 DIAGNOSIS — R293 Abnormal posture: Secondary | ICD-10-CM

## 2020-06-16 DIAGNOSIS — M545 Low back pain, unspecified: Secondary | ICD-10-CM

## 2020-06-16 NOTE — Therapy (Signed)
[facility] [address] [place], [place], [address] Phone: [phone]   Fax:  [phone]  Physical Therapy Treatment  Patient Details  Name: Lauren Knox MRN: [id number] Date of Birth: 01-Jan-1946 Referring Provider (PT): Bo Merino MD  Progress Note Reporting Period 05/24/20 to 06/16/20  See note below for Objective Data and Assessment of Progress/Goals.      Encounter Date: 06/16/2020   PT End of Session - 06/16/20 1132    Visit Number 7    Number of Visits 15    Date for PT Re-Evaluation 07/14/20    Authorization Type [employer] Medicare    Progress Note Due on Visit 15    PT Start Time 1125    PT Stop Time 1205    PT Time Calculation (min) 40 min    Activity Tolerance Patient tolerated treatment well    Behavior During Therapy WFL for tasks assessed/performed           Past Medical History:  Diagnosis Date  . ADD (attention deficit disorder) 01/27/2016  . 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 Osteopenia   . Raynaud's disease   . 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 Rosacea 01/27/2016  . Varicose veins   . Vitamin D deficiency 01/27/2016    Past Surgical History:  Procedure Laterality Date  . ABDOMINAL HYSTERECTOMY    . CESAREAN SECTION    . CHOLECYSTECTOMY    . INTESTINAL BLOCKAGE    . TONSILLECTOMY      There were no vitals filed for this visit.   Subjective Assessment - 06/16/20 1215    Subjective Patient says things are going fine. No pain currently. She is unsure how to quantify improvement but says she thinks maybe 70%    Pertinent History arthritis, osteoporosis, spina bifida    Limitations Standing;Walking;House hold activities    Patient Stated Goals Get a stronger back and neck    Currently in Pain? No/denies              [facility] PT Assessment - 06/16/20 0001      Assessment   Medical  Diagnosis lumbar/ thoracic and cervical DDD    Referring Provider (PT) Bo Merino MD    Prior [place] residence    Living Arrangements Spouse/significant other      Prior Function   Level of Independence Independent      Cognition   Overall Cognitive Status Within Functional Limits for tasks assessed      Observation/Other Assessments   Focus on Therapeutic Outcomes (FOTO)  60% function      Posture/Postural Control   Posture/Postural Control Postural limitations    Postural Limitations Rounded Shoulders                         [facility] Adult PT Treatment/Exercise - 06/16/20 0001      Shoulder Exercises: Standing   Other Standing Exercises GTB rowx x15, GTB extension with alternating march x15    Other Standing Exercises GTB palloff press in semi tandem stance x20 each                    PT Short Term Goals - 06/16/20 1148      PT SHORT TERM GOAL #1   Title Patient will be independent with initial  HEP and self-management strategies to improve functional outcomes    Time 2    Period Weeks    Status On-going    Target Date 06/07/20             PT Long Term Goals - 06/16/20 1148      PT LONG TERM GOAL #1   Title Patient will improve FOTO score by at least 5% to indicate improvement in functional outcomes    Baseline Remains at 60%    Time 4    Period Weeks    Status On-going      PT LONG TERM GOAL #2   Title Patient will report at least 75% overall improvement in subjective complaint to indicate improvement in ability to perform ADLs.    Baseline Reports 70%    Time 4    Period Weeks    Status On-going      PT LONG TERM GOAL #3   Title Patient will be able to perform OH ADLs with no increased back pain for increased functional ability.    Baseline Reports no increased pain with OH ADLs in recent memory    Time 4    Period Weeks    Status Achieved      PT LONG TERM GOAL #4    Title Patient will show improved postural awareness and be indpendant with advanced HEP for improved functional outcomes    Time 4    Period Weeks    Status On-going                 Plan - 06/16/20 1213    Clinical Impression Statement Patient is making good progress toward therapy goals. Reports improvement in pain free participation in ADLs. Still limited by postural deficits and reports of intermittent stiffness and aching. Patient will continue to benefit from skilled therapy services to address remining deficits for reduced pain and improved functional ability.    Personal Factors and Comorbidities Comorbidity 2    Examination-Activity Limitations Reach Overhead;Carry;Lift    Examination-Participation Restrictions Cleaning;Community Activity;Yard Work    Stability/Clinical Decision Making Stable/Uncomplicated    Rehab Potential Good    PT Frequency 2x / week    PT Duration 4 weeks    PT Treatment/Interventions ADLs/Self Care Home Management;Aquatic Therapy;Biofeedback;Moist Heat;Stair training;Gait training;Functional mobility training;Traction;Ultrasound;Canalith Repostioning;Cryotherapy;Parrafin;Neuromuscular re-education;Manual techniques;Dry needling;Passive range of motion;Scar mobilization;Vasopneumatic Device;Vestibular;Joint Manipulations;Spinal Manipulations;Visual/perceptual remediation/compensation;Energy conservation;Manual lymph drainage;Splinting;Taping;Patient/family education;Orthotic Fit/Training;Compression bandaging;Balance training;Therapeutic exercise;Therapeutic activities;Fluidtherapy;Contrast Bath;Electrical Stimulation;Iontophoresis 45m/ml Dexamethasone;DME Instruction    PT Next Visit Plan Progress postural strengthening as tolerated. Manual to cervical/ thoracic for pain as needed. continue with balance    PT Home Exercise Plan Eval: chin tuck at wall, scap retraction, UT stretch 5/17 band row, horiz shoulder abduction, ab brace 5/24 shoulder extension 5/26  scap ret with [facility], shoulder PNF d2 in supine    Consulted and Agree with Plan of Care Patient           Patient will benefit from skilled therapeutic intervention in order to improve the following deficits and impairments:  Pain,Improper body mechanics,Increased fascial restricitons,Impaired flexibility,Decreased range of motion,Decreased activity tolerance,Decreased strength,Hypomobility,Postural dysfunction  Visit Diagnosis: Cervicalgia  Low back pain, unspecified back pain laterality, unspecified chronicity, unspecified whether sciatica present  Pain in thoracic spine  Abnormal posture     Problem List Patient Active Problem List   Diagnosis Date Noted  . 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 Raynaud's syndrome without  gangrene 01/27/2016  . ADD (attention deficit disorder) 01/27/2016  . Rosacea 01/27/2016  . Vitamin D deficiency 01/27/2016  . 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 Muscle weakness (generalized) 09/17/2013  . Scoliosis concern 09/17/2013  . Stiffness of joint, not elsewhere classified, pelvic region and thigh 09/17/2013  . Difficulty in walking(719.7) 09/17/2013   12:15 PM, 06/16/20 Josue Hector PT DPT  Physical Therapist with [facility]  [phone] [place] [address] [place], [place], [address] Phone: [phone]   Fax:  [phone]  Name: Lauren Knox MRN: [id number] Date of Birth: 14-Oct-1945

## 2020-06-21 ENCOUNTER — Ambulatory Visit (HOSPITAL_COMMUNITY): Payer: Medicare HMO | Admitting: Physical Therapy

## 2020-06-21 ENCOUNTER — Encounter (HOSPITAL_COMMUNITY): Payer: Self-pay | Admitting: Physical Therapy

## 2020-06-21 ENCOUNTER — Other Ambulatory Visit: Payer: Self-pay

## 2020-06-21 DIAGNOSIS — M545 Low back pain, unspecified: Secondary | ICD-10-CM

## 2020-06-21 DIAGNOSIS — M542 Cervicalgia: Secondary | ICD-10-CM | POA: Diagnosis not present

## 2020-06-21 DIAGNOSIS — M546 Pain in thoracic spine: Secondary | ICD-10-CM

## 2020-06-21 DIAGNOSIS — R293 Abnormal posture: Secondary | ICD-10-CM

## 2020-06-21 NOTE — Therapy (Signed)
[place], [place], [address] Phone: [phone]   Fax:  [phone]  Physical Therapy Treatment  Patient Details  Name: Lauren Knox MRN: [id number] Date of Birth: Sep 22, 1945 Referring Provider (PT): Bo Merino MD   Encounter Date: 06/21/2020   PT End of Session - 06/21/20 1046    Visit Number 8    Number of Visits 15    Date for PT Re-Evaluation 07/14/20    Authorization Type [employer] Medicare    Progress Note Due on Visit 15    PT Start Time 1042    PT Stop Time 1122    PT Time Calculation (min) 40 min    Activity Tolerance Patient tolerated treatment well    Behavior During Therapy [facility] for tasks assessed/performed           Past Medical History:  Diagnosis Date  . ADD (attention deficit disorder) 01/27/2016  . 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 Osteopenia   . Raynaud's disease   . 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 Rosacea 01/27/2016  . Varicose veins   . Vitamin D deficiency 01/27/2016    Past Surgical History:  Procedure Laterality Date  . ABDOMINAL HYSTERECTOMY    . CESAREAN SECTION    . CHOLECYSTECTOMY    . INTESTINAL BLOCKAGE    . TONSILLECTOMY      There were no vitals filed for this visit.   Subjective Assessment - 06/21/20 1045    Subjective Patient says she has been trying to work on her posture. Her back was sore when looking down while cooking.    Pertinent History arthritis, osteoporosis, spina bifida    Limitations Standing;Walking;House hold activities    Patient Stated Goals Get a stronger back and neck    Currently in Pain? No/denies                             [facility] Adult PT Treatment/Exercise - 06/21/20 0001      Shoulder Exercises: Standing   Horizontal ABduction Theraband;20 reps    Theraband Level (Shoulder Horizontal ABduction) Level  3 (Green)    External Rotation Both;20 reps;Theraband    Theraband Level (Shoulder External Rotation) Level 3 (Green)    External Rotation Limitations against wall    Extension 20 reps;Theraband    Theraband Level (Shoulder Extension) Level 3 (Green)    Row 20 reps;Theraband    Theraband Level (Shoulder Row) Level 3 (Green)    Diagonals 20 reps;Theraband    Theraband Level (Shoulder Diagonals) Level 3 (Green)    Other Standing Exercises X to V x20,    Other Standing Exercises GTB palloff press in semi tandem stance x20 each      Shoulder Exercises: Stretch   Other Shoulder Stretches pec stretch in doorway 3 x 20", standing UT stretch 3 x 20"    Other Shoulder Stretches seated lateral sidebending 5 x10"                    PT Short Term Goals - 06/16/20 1148      PT SHORT TERM GOAL #1   Title Patient will be independent with initial HEP and self-management strategies to improve functional outcomes    Time 2    Period Weeks    Status On-going    Target Date 06/07/20  PT Long Term Goals - 06/16/20 1148      PT LONG TERM GOAL #1   Title Patient will improve FOTO score by at least 5% to indicate improvement in functional outcomes    Baseline Remains at 60%    Time 4    Period Weeks    Status On-going      PT LONG TERM GOAL #2   Title Patient will report at least 75% overall improvement in subjective complaint to indicate improvement in ability to perform ADLs.    Baseline Reports 70%    Time 4    Period Weeks    Status On-going      PT LONG TERM GOAL #3   Title Patient will be able to perform OH ADLs with no increased back pain for increased functional ability.    Baseline Reports no increased pain with OH ADLs in recent memory    Time 4    Period Weeks    Status Achieved      PT LONG TERM GOAL #4   Title Patient will show improved postural awareness and be indpendant with advanced HEP for improved functional outcomes    Time 4    Period Weeks     Status On-going                 Plan - 06/21/20 1152    Clinical Impression Statement Patient tolerated ther ex progressions well. Added X to Y for postural strength progressions. Added lateral side bend and pec stretching in doorway for improved thoracic mobility. Patient educated on proper form and function of all added exercise. Patient requires verbal cues for biomechanics with shoulder ER. Patient will continue to benefit from skilled therapy services to reduce deficits and improve functional ability.    Personal Factors and Comorbidities Comorbidity 2    Examination-Activity Limitations Reach Overhead;Carry;Lift    Examination-Participation Restrictions Cleaning;Community Activity;Yard Work    Stability/Clinical Decision Making Stable/Uncomplicated    Rehab Potential Good    PT Frequency 2x / week    PT Duration 4 weeks    PT Treatment/Interventions ADLs/Self Care Home Management;Aquatic Therapy;Biofeedback;Moist Heat;Stair training;Gait training;Functional mobility training;Traction;Ultrasound;Canalith Repostioning;Cryotherapy;Parrafin;Neuromuscular re-education;Manual techniques;Dry needling;Passive range of motion;Scar mobilization;Vasopneumatic Device;Vestibular;Joint Manipulations;Spinal Manipulations;Visual/perceptual remediation/compensation;Energy conservation;Manual lymph drainage;Splinting;Taping;Patient/family education;Orthotic Fit/Training;Compression bandaging;Balance training;Therapeutic exercise;Therapeutic activities;Fluidtherapy;Contrast Bath;Electrical Stimulation;Iontophoresis 56m/ml Dexamethasone;DME Instruction    PT Next Visit Plan Progress postural strengthening as tolerated. Manual to cervical/ thoracic for pain as needed. continue with balance    PT Home Exercise Plan Eval: chin tuck at wall, scap retraction, UT stretch 5/17 band row, horiz shoulder abduction, ab brace 5/24 shoulder extension 5/26 scap [facility], shoulder PNF d2 in supine 6/7 lateral  sidebend, x to v    Consulted and Agree with Plan of Care Patient           Patient will benefit from skilled therapeutic intervention in order to improve the following deficits and impairments:  Pain,Improper body mechanics,Increased fascial restricitons,Impaired flexibility,Decreased range of motion,Decreased activity tolerance,Decreased strength,Hypomobility,Postural dysfunction  Visit Diagnosis: Cervicalgia  Low back pain, unspecified back pain laterality, unspecified chronicity, unspecified whether sciatica present  Pain in thoracic spine  Abnormal posture     Problem List Patient Active Problem List   Diagnosis Date Noted  . 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 ADD (attention deficit disorder) 01/27/2016  . Rosacea 01/27/2016  . Vitamin D deficiency 01/27/2016  .  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 Muscle weakness (generalized) 09/17/2013  . Scoliosis concern 09/17/2013  . Stiffness of joint, not elsewhere classified, pelvic region and thigh 09/17/2013  . Difficulty in walking(719.7) 09/17/2013    11:54 AM, 06/21/20 Josue Hector PT DPT  Physical Therapist with [facility]  [phone] [place] [address] [place], [place], [address] Phone: [phone]   Fax:  [phone]  Name: EVERLI ROTHER MRN: [id number] Date of Birth: 1946-01-04

## 2020-06-23 ENCOUNTER — Other Ambulatory Visit: Payer: Self-pay

## 2020-06-23 ENCOUNTER — Ambulatory Visit (HOSPITAL_COMMUNITY): Payer: Medicare HMO | Admitting: Physical Therapy

## 2020-06-23 DIAGNOSIS — M546 Pain in thoracic spine: Secondary | ICD-10-CM

## 2020-06-23 DIAGNOSIS — M545 Low back pain, unspecified: Secondary | ICD-10-CM

## 2020-06-23 DIAGNOSIS — R293 Abnormal posture: Secondary | ICD-10-CM

## 2020-06-23 DIAGNOSIS — M542 Cervicalgia: Secondary | ICD-10-CM

## 2020-06-23 NOTE — Therapy (Signed)
[place], [place], [address] Phone: [phone]   Fax:  [phone]  Physical Therapy Treatment  Patient Details  Name: Lauren Knox MRN: [id number] Date of Birth: [birth year] Referring Provider (PT): Bo Merino MD   Encounter Date: 06/23/2020   PT End of Session - 06/23/20 1040     Visit Number 9    Number of Visits 15    Date for PT Re-Evaluation 07/14/20    Authorization Type [employer] Medicare    Progress Note Due on Visit 15    PT Start Time 1037    PT Stop Time 1115    PT Time Calculation (min) 38 min    Activity Tolerance Patient tolerated treatment well    Behavior During Therapy [facility] for tasks assessed/performed             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   Subjective Assessment - 06/23/20 1039     Subjective No new complaints. Doing well. No pain    Pertinent History arthritis, osteoporosis, spina bifida    Limitations Standing;Walking;House hold activities    Patient Stated Goals Get a stronger back and neck    Currently in Pain? No/denies                               [facility] Adult PT Treatment/Exercise - 06/23/20 0001       Shoulder Exercises: Standing   External Rotation Both;20 reps;Theraband    Theraband Level (Shoulder External Rotation) Level 3 (Green);Level 2 (Red)   1st set green, 2nd set red   Extension 20  reps;Theraband    Theraband Level (Shoulder Extension) Level 4 (Blue)    Row 20 reps;Theraband    Theraband Level (Shoulder Row) Level 4 (Blue)    Diagonals 20 reps;Theraband    Theraband Level (Shoulder Diagonals) Level 3 (Green)    Other Standing Exercises band lifts/ chops RTB 2 x10 each, step up with power up wtih HHA x1 on 7 inch step x15 each, x to v x20    Other Standing Exercises BTB palloff press in semi tandem stance x20 each                      PT Short Term Goals - 06/16/20 1148       PT SHORT TERM GOAL #1   Title Patient will be independent with initial HEP and self-management strategies to improve functional outcomes    Time 2    Period Weeks    Status On-going    Target Date 06/07/20               PT Long Term Goals - 06/16/20 1148       PT LONG TERM GOAL #1   Title Patient will improve FOTO  score by at least 5% to indicate improvement in functional outcomes    Baseline Remains at 60%    Time 4    Period Weeks    Status On-going      PT LONG TERM GOAL #2   Title Patient will report at least 75% overall improvement in subjective complaint to indicate improvement in ability to perform ADLs.    Baseline Reports 70%    Time 4    Period Weeks    Status On-going      PT LONG TERM GOAL #3   Title Patient will be able to perform OH ADLs with no increased back pain for increased functional ability.    Baseline Reports no increased pain with OH ADLs in recent memory    Time 4    Period Weeks    Status Achieved      PT LONG TERM GOAL #4   Title Patient will show improved postural awareness and be indpendant with advanced HEP for improved functional outcomes    Time 4    Period Weeks    Status On-going                   Plan - 06/23/20 1109     Clinical Impression Statement Patient tolerated session well today. Able to progress band rows and extension to blue resistance band. Patient well challenged with added balance activity.  Patient required verbal cues and demo for proper form and function of added band lifts and chops for core and balance. Patient will continue to benefit from skilled therapy services to reduce deficits and improve functional ability.    Personal Factors and Comorbidities Comorbidity 2    Examination-Activity Limitations Reach Overhead;Carry;Lift    Examination-Participation Restrictions Cleaning;Community Activity;Yard Work    Stability/Clinical Decision Making Stable/Uncomplicated    Rehab Potential Good    PT Frequency 2x / week    PT Duration 4 weeks    PT Treatment/Interventions ADLs/Self Care Home Management;Aquatic Therapy;Biofeedback;Moist Heat;Stair training;Gait training;Functional mobility training;Traction;Ultrasound;Canalith Repostioning;Cryotherapy;Parrafin;Neuromuscular re-education;Manual techniques;Dry needling;Passive range of motion;Scar mobilization;Vasopneumatic Device;Vestibular;Joint Manipulations;Spinal Manipulations;Visual/perceptual remediation/compensation;Energy conservation;Manual lymph drainage;Splinting;Taping;Patient/family education;Orthotic Fit/Training;Compression bandaging;Balance training;Therapeutic exercise;Therapeutic activities;Fluidtherapy;Contrast Bath;Electrical Stimulation;Iontophoresis 79m/ml Dexamethasone;DME Instruction    PT Next Visit Plan Progress postural strengthening as tolerated. Manual to cervical/ thoracic for pain as needed. continue with balance    PT Home Exercise Plan Eval: chin tuck at wall, scap retraction, UT stretch 5/17 band row, horiz shoulder abduction, ab brace 5/24 shoulder extension 5/26 scap ret with [facility], shoulder PNF d2 in supine 6/7 lateral sidebend, x to v    Consulted and Agree with Plan of Care Patient             Patient will benefit from skilled therapeutic intervention in order to improve the following deficits and impairments:  Pain, Improper body mechanics, Increased fascial restricitons, Impaired flexibility,  Decreased range of motion, Decreased activity tolerance, Decreased strength, Hypomobility, Postural dysfunction  Visit Diagnosis: Cervicalgia  Low back pain, unspecified back pain laterality, unspecified chronicity, unspecified whether sciatica present  Pain in thoracic spine  Abnormal posture     Problem List 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

## 2020-06-28 ENCOUNTER — Other Ambulatory Visit: Payer: Self-pay

## 2020-06-28 ENCOUNTER — Encounter (HOSPITAL_COMMUNITY): Payer: Self-pay | Admitting: Physical Therapy

## 2020-06-28 ENCOUNTER — Ambulatory Visit (HOSPITAL_COMMUNITY): Payer: Medicare HMO | Admitting: Physical Therapy

## 2020-06-28 DIAGNOSIS — M545 Low back pain, unspecified: Secondary | ICD-10-CM

## 2020-06-28 DIAGNOSIS — R293 Abnormal posture: Secondary | ICD-10-CM

## 2020-06-28 DIAGNOSIS — M542 Cervicalgia: Secondary | ICD-10-CM | POA: Diagnosis not present

## 2020-06-28 DIAGNOSIS — M546 Pain in thoracic spine: Secondary | ICD-10-CM

## 2020-06-28 NOTE — Therapy (Signed)
[place], [place], [address] Phone: [phone]   Fax:  [phone]  Physical Therapy Treatment  Patient Details  Name: Lauren Knox MRN: [id number] Date of Birth: 1946/01/03 Referring Provider (PT): Bo Merino MD   Encounter Date: 06/28/2020   PT End of Session - 06/28/20 1315     Visit Number 10    Number of Visits 15    Date for PT Re-Evaluation 07/14/20    Authorization Type [employer] Medicare    Progress Note Due on Visit 15    PT Start Time 1308    PT Stop Time 1350    PT Time Calculation (min) 42 min    Activity Tolerance Patient tolerated treatment well    Behavior During Therapy [facility] for tasks assessed/performed             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   Subjective Assessment - 06/28/20 1313     Subjective Patient notes some LT shoulder pain beginning Friday. She does not know why. She tried some stretching but pain persisted into Monday. It feels like a pinch currently.    Pertinent History arthritis, osteoporosis, spina bifida    Limitations Standing;Walking;House hold activities    Patient Stated Goals Get a stronger back and neck    Currently in Pain? Yes    Pain Score 7     Pain Location Neck    Pain Orientation Left;Posterior    Pain Descriptors / Indicators Other (Comment)   pinch                               [facility] Adult PT Treatment/Exercise - 06/28/20 0001       Neck Exercises: Seated   Neck Retraction 15 reps    Neck Retraction Limitations chin tuck with extension x15      Shoulder Exercises: Supine   Other Supine Exercises thoracic spine mob with vertical towel roll 3 min with deep breathing    Other Supine Exercises supine shoulder flexion with tspine mobs x10 each      Shoulder Exercises: Seated   Retraction 15 reps      Manual Therapy   Manual Therapy Soft tissue mobilization    Manual therapy comments completed seperatetly from all other interventions    Soft tissue mobilization IASTM using theragun LV 10 on LT upper trapezius and levator      Neck Exercises: Stretches   Upper Trapezius Stretch Right;Left;3 reps;30 seconds                      PT Short Term Goals - 06/16/20 1148       PT SHORT TERM GOAL #1   Title Patient will be independent with initial HEP and self-management strategies to improve functional outcomes  Time 2    Period Weeks    Status On-going    Target Date 06/07/20               PT Long Term Goals - 06/16/20 1148       PT LONG TERM GOAL #1   Title Patient will improve FOTO score by at least 5% to indicate improvement in functional outcomes    Baseline Remains at 60%    Time 4    Period Weeks    Status On-going      PT LONG TERM GOAL #2   Title Patient will report at least 75% overall improvement in subjective complaint to indicate improvement in ability to perform ADLs.    Baseline Reports 70%    Time 4    Period Weeks    Status On-going      PT LONG TERM GOAL #3   Title Patient will be able to perform OH ADLs with no increased back pain for increased functional ability.    Baseline Reports no increased pain with OH ADLs in recent memory    Time 4    Period Weeks    Status Achieved      PT LONG TERM GOAL #4   Title Patient will show improved postural awareness and be  indpendant with advanced HEP for improved functional outcomes    Time 4    Period Weeks    Status On-going                   Plan - 06/28/20 1350     Clinical Impression Statement Activity graded per patient tolerance. Focuses more on postural education and strengthening exercise. Patient required several cues for proper form with upper trap stretching. Added supine thoracic spine mobs and shoulder mobility. Performed IASTM to target area, to address pain and restriction. Will assess response next visit.    Personal Factors and Comorbidities Comorbidity 2    Examination-Activity Limitations Reach Overhead;Carry;Lift    Examination-Participation Restrictions Cleaning;Community Activity;Yard Work    Stability/Clinical Decision Making Stable/Uncomplicated    Rehab Potential Good    PT Frequency 2x / week    PT Duration 4 weeks    PT Treatment/Interventions ADLs/Self Care Home Management;Aquatic Therapy;Biofeedback;Moist Heat;Stair training;Gait training;Functional mobility training;Traction;Ultrasound;Canalith Repostioning;Cryotherapy;Parrafin;Neuromuscular re-education;Manual techniques;Dry needling;Passive range of motion;Scar mobilization;Vasopneumatic Device;Vestibular;Joint Manipulations;Spinal Manipulations;Visual/perceptual remediation/compensation;Energy conservation;Manual lymph drainage;Splinting;Taping;Patient/family education;Orthotic Fit/Training;Compression bandaging;Balance training;Therapeutic exercise;Therapeutic activities;Fluidtherapy;Contrast Bath;Electrical Stimulation;Iontophoresis 28m/ml Dexamethasone;DME Instruction    PT Next Visit Plan Assess response to manual. Progress postural strengthening as tolerated. Manual to cervical/ thoracic for pain as needed. continue with balance    PT Home Exercise Plan Eval: chin tuck at wall, scap retraction, UT stretch 5/17 band row, horiz shoulder abduction, ab brace 5/24 shoulder extension 5/26 scap ret with [facility], shoulder PNF  d2 in supine 6/7 lateral sidebend, x to v 6/14 tspine mobs with towel roll    Consulted and Agree with Plan of Care Patient             Patient will benefit from skilled therapeutic intervention in order to improve the following deficits and impairments:  Pain, Improper body mechanics, Increased fascial restricitons, Impaired flexibility, Decreased range of motion, Decreased activity tolerance, Decreased strength, Hypomobility, Postural dysfunction  Visit Diagnosis: Cervicalgia  Low back pain, unspecified back pain laterality, unspecified chronicity, unspecified whether sciatica present  Pain in thoracic spine  Abnormal posture     Problem List 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  Osteoarthritis of hands, 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   DDD (degenerative disc disease), 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   Raynaud'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

## 2020-06-28 NOTE — Patient Instructions (Signed)
Access Code: [email or web address] URL: [email or web address] Date: 06/28/2020 Prepared by: Josue Hector  Exercises Supine Thoracic Mobilization Towel Roll Vertical - 3 x daily - 7 x weekly - 1 sets - 1 reps - 3-5 minutes hold

## 2020-06-30 ENCOUNTER — Encounter (HOSPITAL_COMMUNITY): Payer: Self-pay | Admitting: Physical Therapy

## 2020-06-30 ENCOUNTER — Ambulatory Visit (HOSPITAL_COMMUNITY): Payer: Medicare HMO | Admitting: Physical Therapy

## 2020-06-30 ENCOUNTER — Other Ambulatory Visit: Payer: Self-pay

## 2020-06-30 DIAGNOSIS — M542 Cervicalgia: Secondary | ICD-10-CM

## 2020-06-30 DIAGNOSIS — R293 Abnormal posture: Secondary | ICD-10-CM

## 2020-06-30 DIAGNOSIS — M545 Low back pain, unspecified: Secondary | ICD-10-CM

## 2020-06-30 DIAGNOSIS — M546 Pain in thoracic spine: Secondary | ICD-10-CM

## 2020-06-30 NOTE — Patient Instructions (Signed)

## 2020-06-30 NOTE — Therapy (Signed)
[place], [place], [address] Phone: [phone]   Fax:  [phone]  Physical Therapy Treatment  Patient Details  Name: Lauren Knox MRN: [id number] Date of Birth: [birth year] Referring Provider (PT): Bo Merino MD   Encounter Date: 06/30/2020   PT End of Session - 06/30/20 1127     Visit Number 11    Number of Visits 15    Date for PT Re-Evaluation 07/14/20    Authorization Type [employer] Medicare    Progress Note Due on Visit 15    PT Start Time 1123    PT Stop Time 1203    PT Time Calculation (min) 40 min    Activity Tolerance Patient tolerated treatment well    Behavior During Therapy [facility] for tasks assessed/performed             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   Subjective Assessment - 06/30/20 1126     Subjective Feeling some better. Pinching pain has lessened. Pain is " moving around some". Feeling it lower in back lately. Did sleep better last night. Feels exercises last time have been helpful.    Pertinent History arthritis, osteoporosis, spina bifida    Limitations Standing;Walking;House hold activities    Patient Stated Goals Get a stronger back and neck    Currently in Pain? No/denies                               [facility] Adult PT Treatment/Exercise - 06/30/20 0001       Neck Exercises: Seated   Neck Retraction 15 reps     Neck Retraction Limitations chin tuck with extension x15    Other Seated Exercise scapular retraction 10 x 5"      Manual Therapy   Manual Therapy Soft tissue mobilization;Joint mobilization    Manual therapy comments completed seperatetly from all other interventions    Joint Mobilization Grade II P/A mobs T9-T2    Soft tissue mobilization STM to LT upper trap pre and post dry needling, with trigger point identification and area preparation      Neck Exercises: Stretches   Upper Trapezius Stretch Right;Left;3 reps;30 seconds              Trigger Point Dry Needling - 06/30/20 0001     Consent Given? Yes    Education Handout Provided Yes    Muscles Treated Head and Neck Upper trapezius    Dry Needling Comments 1 needle to LT upper trapezius x 2, tolerated well    Upper Trapezius Response Twitch reponse elicited;Palpable increased muscle length                    PT Short Term Goals - 06/16/20 1148       PT SHORT TERM GOAL #  1   Title Patient will be independent with initial HEP and self-management strategies to improve functional outcomes    Time 2    Period Weeks    Status On-going    Target Date 06/07/20               PT Long Term Goals - 06/16/20 1148       PT LONG TERM GOAL #1   Title Patient will improve FOTO score by at least 5% to indicate improvement in functional outcomes    Baseline Remains at 60%    Time 4    Period Weeks    Status On-going      PT LONG TERM GOAL #2   Title Patient will report at least 75% overall improvement in subjective complaint to indicate improvement in ability to perform ADLs.    Baseline Reports 70%    Time 4    Period Weeks    Status On-going      PT LONG TERM GOAL #3   Title Patient will be able to perform OH ADLs with no increased back pain for increased functional ability.    Baseline Reports no increased pain with OH ADLs in recent memory    Time 4    Period Weeks    Status Achieved      PT LONG  TERM GOAL #4   Title Patient will show improved postural awareness and be indpendant with advanced HEP for improved functional outcomes    Time 4    Period Weeks    Status On-going                   Plan - 06/30/20 1212     Clinical Impression Statement Patient tolerated session well today with minimal complaint. Performed some reassessment, unable to provoke pain with cervical and shoulder AROM. Patient does have notable hypomobility throughout thoracic spine and palpable trigger points in LT trapezius. Performed manual mobilization and trigger point dry needling to address these restrictions. Patient tolerated this well and reports 0/10 pain post treatment. Educated patient on dry needling and issued handout. Will assess response next visit.    Personal Factors and Comorbidities Comorbidity 2    Examination-Activity Limitations Reach Overhead;Carry;Lift    Examination-Participation Restrictions Cleaning;Community Activity;Yard Work    Stability/Clinical Decision Making Stable/Uncomplicated    Rehab Potential Good    PT Frequency 2x / week    PT Duration 4 weeks    PT Treatment/Interventions ADLs/Self Care Home Management;Aquatic Therapy;Biofeedback;Moist Heat;Stair training;Gait training;Functional mobility training;Traction;Ultrasound;Canalith Repostioning;Cryotherapy;Parrafin;Neuromuscular re-education;Manual techniques;Dry needling;Passive range of motion;Scar mobilization;Vasopneumatic Device;Vestibular;Joint Manipulations;Spinal Manipulations;Visual/perceptual remediation/compensation;Energy conservation;Manual lymph drainage;Splinting;Taping;Patient/family education;Orthotic Fit/Training;Compression bandaging;Balance training;Therapeutic exercise;Therapeutic activities;Fluidtherapy;Contrast Bath;Electrical Stimulation;Iontophoresis 66m/ml Dexamethasone;DME Instruction    PT Next Visit Plan Assess response to manual and DN. Progress postural strengthening as tolerated. Manual to  cervical/ thoracic for pain as needed. continue with balance    PT Home Exercise Plan Eval: chin tuck at wall, scap retraction, UT stretch 5/17 band row, horiz shoulder abduction, ab brace 5/24 shoulder extension 5/26 scap ret [facility], shoulder PNF d2 in supine 6/7 lateral sidebend, x to v 6/14 tspine mobs with towel roll    Consulted and Agree with Plan of Care Patient             Patient will benefit from skilled therapeutic intervention in order to improve the following deficits and impairments:  Pain, Improper body mechanics, Increased fascial restricitons, Impaired flexibility, Decreased range of motion, Decreased activity tolerance, Decreased strength, Hypomobility, Postural  dysfunction  Visit Diagnosis: Cervicalgia  Low back pain, unspecified back pain laterality, unspecified chronicity, unspecified whether sciatica present  Pain in thoracic spine  Abnormal posture     Problem List 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

## 2020-07-05 ENCOUNTER — Other Ambulatory Visit: Payer: Self-pay

## 2020-07-05 ENCOUNTER — Ambulatory Visit (HOSPITAL_COMMUNITY): Payer: Medicare HMO | Admitting: Physical Therapy

## 2020-07-05 ENCOUNTER — Encounter (HOSPITAL_COMMUNITY): Payer: Self-pay | Admitting: Physical Therapy

## 2020-07-05 DIAGNOSIS — M546 Pain in thoracic spine: Secondary | ICD-10-CM

## 2020-07-05 DIAGNOSIS — M545 Low back pain, unspecified: Secondary | ICD-10-CM

## 2020-07-05 DIAGNOSIS — R293 Abnormal posture: Secondary | ICD-10-CM

## 2020-07-05 DIAGNOSIS — M542 Cervicalgia: Secondary | ICD-10-CM

## 2020-07-05 NOTE — Patient Instructions (Signed)
Access Code: [email or web address] URL: [email or web address] Date: 07/05/2020 Prepared by: Josue Hector  Exercises Seated Thoracic Extension and Rotation with Reach - 2 x daily - 7 x weekly - 1 sets - 10 reps Seated Thoracic Extension Arms Overhead - 2 x daily - 7 x weekly - 1 sets - 10 reps

## 2020-07-05 NOTE — Therapy (Signed)
[place], [place], [address] Phone: [phone]   Fax:  [phone]  Physical Therapy Treatment  Patient Details  Name: Lauren Knox MRN: [id number] Date of Birth: Aug 10, 1945 Referring Provider (PT): Bo Merino MD   Encounter Date: 07/05/2020   PT End of Session - 07/05/20 1405     Visit Number 12    Number of Visits 15    Date for PT Re-Evaluation 07/14/20    Authorization Type [employer] Medicare    Progress Note Due on Visit 15    PT Start Time 1355    PT Stop Time 1440    PT Time Calculation (min) 45 min    Activity Tolerance Patient tolerated treatment well    Behavior During Therapy [facility] for tasks assessed/performed             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   Subjective Assessment - 07/05/20 1404     Subjective Patient describes unclear result of prior treatment. She states needling "may have loosened something up, I'm not sure". She states pain was more focused lower in her back over the weekend which distracted her form her neck. She says things were bad Sunday and Monday, but is feeling a little better today. She reports compliance with HEP.    Pertinent History arthritis, osteoporosis, spina bifida    Limitations Standing;Walking;House hold activities    Patient Stated Goals Get a stronger back and neck    Currently in Pain? No/denies                                [facility] Adult PT Treatment/Exercise - 07/05/20 0001       Shoulder Exercises: Seated   Other Seated Exercises thoracic excursions 3D x 10 each      Shoulder Exercises: Standing   Extension Both;20 reps;Theraband    Theraband Level (Shoulder Extension) Level 4 (Blue)    Other Standing Exercises high band rows BTB x20                      PT Short Term Goals - 06/16/20 1148       PT SHORT TERM GOAL #1   Title Patient will be independent with initial HEP and self-management strategies to improve functional outcomes    Time 2    Period Weeks    Status On-going    Target Date 06/07/20               PT Long Term Goals - 06/16/20 1148       PT LONG TERM GOAL #1   Title Patient will improve FOTO score by at least 5% to indicate improvement in functional outcomes    Baseline Remains at 60%    Time 4  Period Weeks    Status On-going      PT LONG TERM GOAL #2   Title Patient will report at least 75% overall improvement in subjective complaint to indicate improvement in ability to perform ADLs.    Baseline Reports 70%    Time 4    Period Weeks    Status On-going      PT LONG TERM GOAL #3   Title Patient will be able to perform OH ADLs with no increased back pain for increased functional ability.    Baseline Reports no increased pain with OH ADLs in recent memory    Time 4    Period Weeks    Status Achieved      PT LONG TERM GOAL #4   Title Patient will show improved postural awareness and be indpendant with advanced HEP for improved functional outcomes    Time 4    Period Weeks    Status On-going                   Plan - 07/05/20 1628     Clinical Impression Statement Spent significant time explaining and educating patient on differential diagnosis to help her more clearly describe her symptoms. She describes pain in very ambiguous ways i.e "it just hurts some, and then it stops", so more close ended  questioning employed to narrow focus. Pain seems to be non-mechanical, maybe postural. We have previously discussed this possibility and to perform postural corrections and chin tuck when this occurs, but patient states this has temporary effect. Described that this may be because once she is relaxed posture reverts to normal state. Unable to provoke pain during session. Assessed cervical and shoulder AROM. None of this causes pain. Patient reports no increased pain during treatment other than minor dull tension feeling in trapezius area. Instructed patient on continued postural strengthening and thoracic mobility. Will assess response next visit. If no better in 1 to 2 sessions, will refer back to ordering provider.    Personal Factors and Comorbidities Comorbidity 2    Examination-Activity Limitations Reach Overhead;Carry;Lift    Examination-Participation Restrictions Cleaning;Community Activity;Yard Work    Stability/Clinical Decision Making Stable/Uncomplicated    Rehab Potential Good    PT Frequency 2x / week    PT Duration 4 weeks    PT Treatment/Interventions ADLs/Self Care Home Management;Aquatic Therapy;Biofeedback;Moist Heat;Stair training;Gait training;Functional mobility training;Traction;Ultrasound;Canalith Repostioning;Cryotherapy;Parrafin;Neuromuscular re-education;Manual techniques;Dry needling;Passive range of motion;Scar mobilization;Vasopneumatic Device;Vestibular;Joint Manipulations;Spinal Manipulations;Visual/perceptual remediation/compensation;Energy conservation;Manual lymph drainage;Splinting;Taping;Patient/family education;Orthotic Fit/Training;Compression bandaging;Balance training;Therapeutic exercise;Therapeutic activities;Fluidtherapy;Contrast Bath;Electrical Stimulation;Iontophoresis 68m/ml Dexamethasone;DME Instruction    PT Next Visit Plan Continue postural strengthening, manuals as needed. Thoracic mobility.    PT Home Exercise Plan Eval: chin tuck at wall, scap retraction,  UT stretch 5/17 band row, horiz shoulder abduction, ab brace 5/24 shoulder extension 5/26 scap ret with [facility], shoulder PNF d2 in supine 6/7 lateral sidebend, x to v 6/14 tspine mobs with towel roll 6/21 thoracic excursions    Consulted and Agree with Plan of Care Patient             Patient will benefit from skilled therapeutic intervention in order to improve the following deficits and impairments:  Pain, Improper body mechanics, Increased fascial restricitons, Impaired flexibility, Decreased range of motion, Decreased activity tolerance, Decreased strength, Hypomobility, Postural dysfunction  Visit Diagnosis: Cervicalgia  Low back pain, unspecified back pain laterality, unspecified chronicity, unspecified whether sciatica present  Pain in thoracic spine  Abnormal posture     Problem List 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

## 2020-07-07 ENCOUNTER — Other Ambulatory Visit: Payer: Self-pay

## 2020-07-07 ENCOUNTER — Ambulatory Visit (HOSPITAL_COMMUNITY): Payer: Medicare HMO | Admitting: Physical Therapy

## 2020-07-07 DIAGNOSIS — R293 Abnormal posture: Secondary | ICD-10-CM

## 2020-07-07 DIAGNOSIS — M542 Cervicalgia: Secondary | ICD-10-CM | POA: Diagnosis not present

## 2020-07-07 DIAGNOSIS — M546 Pain in thoracic spine: Secondary | ICD-10-CM

## 2020-07-07 DIAGNOSIS — M545 Low back pain, unspecified: Secondary | ICD-10-CM

## 2020-07-07 NOTE — Therapy (Signed)
[place], [place], [address] Phone: [phone]   Fax:  [phone]  Physical Therapy Treatment  Patient Details  Name: Lauren Knox MRN: [id number] Date of Birth: [birth year] Referring Provider (PT): Bo Merino MD   Encounter Date: 07/07/2020   PT End of Session - 07/07/20 1125     Visit Number 13    Number of Visits 15    Date for PT Re-Evaluation 07/14/20    Authorization Type [employer] Medicare    Progress Note Due on Visit 15    PT Start Time 1120    PT Stop Time 1203    PT Time Calculation (min) 43 min    Activity Tolerance Patient tolerated treatment well    Behavior During Therapy [facility] for tasks assessed/performed             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   Subjective Assessment - 07/07/20 1124     Subjective "I don't know, it's still there". Says she does not think it's her posture. Has been thinking about this more. Trying to walk straight. "I think its still swollen, where it won't go down real fast".    Pertinent History arthritis, osteoporosis, spina bifida    Limitations Standing;Walking;House hold activities    Patient Stated Goals Get a stronger back and neck    Currently in Pain? Yes    Pain Score 6     Pain Location Neck    Pain Orientation Left    Pain Descriptors / Indicators Burning;Sharp;Shooting    Pain Type Chronic pain     Pain Onset More than a month ago    Pain Frequency Intermittent                               [facility] Adult PT Treatment/Exercise - 07/07/20 0001       Shoulder Exercises: Supine   Other Supine Exercises thoracic spine mob with vertical towel roll 3 min with deep breathing, shoulder flexion with thoracic mob x15    Other Supine Exercises supine chin tuck x15, scap squeeze x15, band ER GTB x15, shoulder flexion GTB x 15, horiz shoulder abduction GTB x15      Manual Therapy   Manual Therapy Manual Traction    Manual therapy comments completed seperatetly from all other interventions    Manual Traction 5 x 60" holds using towel                      PT Short Term Goals - 06/16/20 1148       PT SHORT TERM GOAL #1   Title Patient will be independent with initial HEP and self-management strategies to improve functional outcomes    Time 2    Period Weeks    Status On-going  Target Date 06/07/20               PT Long Term Goals - 06/16/20 1148       PT LONG TERM GOAL #1   Title Patient will improve FOTO score by at least 5% to indicate improvement in functional outcomes    Baseline Remains at 60%    Time 4    Period Weeks    Status On-going      PT LONG TERM GOAL #2   Title Patient will report at least 75% overall improvement in subjective complaint to indicate improvement in ability to perform ADLs.    Baseline Reports 70%    Time 4    Period Weeks    Status On-going      PT LONG TERM GOAL #3   Title Patient will be able to perform OH ADLs with no increased back pain for increased functional ability.    Baseline Reports no increased pain with OH ADLs in recent memory    Time 4    Period Weeks    Status Achieved      PT LONG TERM GOAL #4   Title Patient will show improved postural awareness and be indpendant with advanced HEP for improved functional outcomes    Time 4    Period Weeks    Status On-going                    Plan - 07/07/20 1158     Clinical Impression Statement Focused on scapular and shoulder mechanics with improved postural alignment. Performed band decompression series. Patient reports no increased pain but notes " I can feel it". no pinching pain, or provocation of pain noted. Trialed manual cervical traction. Patient noting improvement in sensation of LT shoulder muscle tension. She notes return of feeling within 3 minutes of sitting upright. Pain abolished with verbal cues to elevated chest and retract shoulders. Will assess response to postural corrections and manual traction next visit.    Personal Factors and Comorbidities Comorbidity 2    Examination-Activity Limitations Reach Overhead;Carry;Lift    Examination-Participation Restrictions Cleaning;Community Activity;Yard Work    Stability/Clinical Decision Making Stable/Uncomplicated    Rehab Potential Good    PT Frequency 2x / week    PT Duration 4 weeks    PT Treatment/Interventions ADLs/Self Care Home Management;Aquatic Therapy;Biofeedback;Moist Heat;Stair training;Gait training;Functional mobility training;Traction;Ultrasound;Canalith Repostioning;Cryotherapy;Parrafin;Neuromuscular re-education;Manual techniques;Dry needling;Passive range of motion;Scar mobilization;Vasopneumatic Device;Vestibular;Joint Manipulations;Spinal Manipulations;Visual/perceptual remediation/compensation;Energy conservation;Manual lymph drainage;Splinting;Taping;Patient/family education;Orthotic Fit/Training;Compression bandaging;Balance training;Therapeutic exercise;Therapeutic activities;Fluidtherapy;Contrast Bath;Electrical Stimulation;Iontophoresis 48m/ml Dexamethasone;DME Instruction    PT Next Visit Plan Continue postural strengthening, manuals as needed. Thoracic mobility. Assess response to traction next visit    PT Home Exercise Plan Eval: chin tuck at wall, scap retraction, UT stretch 5/17 band row, horiz shoulder abduction, ab brace 5/24  shoulder extension 5/26 scap ret with GSt. Elizabeth Ft. ThomasER, shoulder PNF d2 in supine 6/7 lateral sidebend, x to v 6/14 tspine mobs with towel roll 6/21 thoracic excursions    Consulted and Agree with Plan of Care Patient             Patient will benefit from skilled therapeutic intervention in order to improve the following deficits and impairments:  Pain, Improper body mechanics, Increased fascial restricitons, Impaired flexibility, Decreased range of motion, Decreased activity tolerance, Decreased strength, Hypomobility, Postural dysfunction  Visit Diagnosis: Cervicalgia  Low back pain, unspecified back pain laterality, unspecified chronicity, unspecified whether sciatica present  Pain in thoracic spine  Abnormal posture  Problem List 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

## 2020-07-11 ENCOUNTER — Other Ambulatory Visit: Payer: Self-pay | Admitting: Physician Assistant

## 2020-07-11 NOTE — Telephone Encounter (Signed)
Next Visit: 11/01/2020  Last Visit: 05/03/2020  Last Fill: 04/11/2020  Dx: Trapezius muscle spasm   Current Dose per office note on 05/03/2020: zanaflex 4 mg at bedtime PRN for muscle spasms  Okay to refill Zanaflex?

## 2020-07-12 ENCOUNTER — Encounter (HOSPITAL_COMMUNITY): Payer: Self-pay | Admitting: Physical Therapy

## 2020-07-12 ENCOUNTER — Ambulatory Visit (HOSPITAL_COMMUNITY): Payer: Medicare HMO | Admitting: Physical Therapy

## 2020-07-12 ENCOUNTER — Other Ambulatory Visit: Payer: Self-pay

## 2020-07-12 DIAGNOSIS — M545 Low back pain, unspecified: Secondary | ICD-10-CM

## 2020-07-12 DIAGNOSIS — M546 Pain in thoracic spine: Secondary | ICD-10-CM

## 2020-07-12 DIAGNOSIS — M542 Cervicalgia: Secondary | ICD-10-CM

## 2020-07-12 DIAGNOSIS — R293 Abnormal posture: Secondary | ICD-10-CM

## 2020-07-12 NOTE — Therapy (Signed)
[place], [place], [address] Phone: [phone]   Fax:  [phone]  Physical Therapy Treatment  Patient Details  Name: Lauren Knox MRN: [id number] Date of Birth: 1945-09-05 Referring Provider (PT): Bo Merino MD   Encounter Date: 07/12/2020   PT End of Session - 07/12/20 1135     Visit Number 14    Number of Visits 15    Date for PT Re-Evaluation 07/14/20    Authorization Type [employer] Medicare    Progress Note Due on Visit 15    PT Start Time 1120    PT Stop Time 1205    PT Time Calculation (min) 45 min    Activity Tolerance Patient tolerated treatment well    Behavior During Therapy [facility] for tasks assessed/performed             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   Subjective Assessment - 07/12/20 1134     Subjective Patient states she is not sure that problem is her posture. She has been doing exercise and does feel improved. Pain not as bad since last visit. Has a home traction using and this has helped. She feels that she tenses her shoulders which causes increased pinch/ nerve pain.    Pertinent History arthritis, osteoporosis, spina bifida    Limitations Standing;Walking;House hold activities    Patient Stated Goals Get a stronger back and neck    Currently in Pain? No/denies    Pain Onset More than a month ago                                [facility] Adult PT Treatment/Exercise - 07/12/20 0001       Shoulder Exercises: Supine   Other Supine Exercises thoracic spine mob with vertical towel roll 3 min with deep breathing    Other Supine Exercises supine chin tuck x15, scap squeeze x15, band ER GTB x15, shoulder flexion GTB x 15, horiz shoulder abduction GTB x15      Manual Therapy   Manual Therapy Manual Traction;Soft tissue mobilization    Manual therapy comments completed seperatetly from all other interventions    Soft tissue mobilization STM to bilateral suboccipital with occipital release    Manual Traction 5 x 60" holds using towel                      PT Short Term Goals - 06/16/20 1148       PT SHORT TERM GOAL #1   Title Patient will be independent with initial HEP and self-management strategies to improve functional outcomes    Time 2    Period Weeks    Status On-going    Target Date 06/07/20  PT Long Term Goals - 06/16/20 1148       PT LONG TERM GOAL #1   Title Patient will improve FOTO score by at least 5% to indicate improvement in functional outcomes    Baseline Remains at 60%    Time 4    Period Weeks    Status On-going      PT LONG TERM GOAL #2   Title Patient will report at least 75% overall improvement in subjective complaint to indicate improvement in ability to perform ADLs.    Baseline Reports 70%    Time 4    Period Weeks    Status On-going      PT LONG TERM GOAL #3   Title Patient will be able to perform OH ADLs with no increased back pain for increased functional ability.    Baseline Reports no increased pain with OH ADLs in recent memory    Time 4    Period Weeks    Status Achieved      PT LONG TERM GOAL #4   Title Patient will show improved postural awareness and be indpendant with advanced HEP for improved functional outcomes    Time 4    Period Weeks    Status On-going                   Plan -  07/12/20 1209     Clinical Impression Statement Added STM to suboccipital with occipital release. Patient noting improved symptoms following manual treatment. No symptoms noted following treatment. Will assess response next session. Reassessment next visit.    Personal Factors and Comorbidities Comorbidity 2    Examination-Activity Limitations Reach Overhead;Carry;Lift    Examination-Participation Restrictions Cleaning;Community Activity;Yard Work    Stability/Clinical Decision Making Stable/Uncomplicated    Rehab Potential Good    PT Frequency 2x / week    PT Duration 4 weeks    PT Treatment/Interventions ADLs/Self Care Home Management;Aquatic Therapy;Biofeedback;Moist Heat;Stair training;Gait training;Functional mobility training;Traction;Ultrasound;Canalith Repostioning;Cryotherapy;Parrafin;Neuromuscular re-education;Manual techniques;Dry needling;Passive range of motion;Scar mobilization;Vasopneumatic Device;Vestibular;Joint Manipulations;Spinal Manipulations;Visual/perceptual remediation/compensation;Energy conservation;Manual lymph drainage;Splinting;Taping;Patient/family education;Orthotic Fit/Training;Compression bandaging;Balance training;Therapeutic exercise;Therapeutic activities;Fluidtherapy;Contrast Bath;Electrical Stimulation;Iontophoresis 40m/ml Dexamethasone;DME Instruction    PT Next Visit Plan Reassess next visit    PT Home Exercise Plan Eval: chin tuck at wall, scap retraction, UT stretch 5/17 band row, horiz shoulder abduction, ab brace 5/24 shoulder extension 5/26 scap ret with GVern[facility] Mem HsptlER, shoulder PNF d2 in supine 6/7 lateral sidebend, x to v 6/14 tspine mobs with towel roll 6/21 thoracic excursions    Consulted and Agree with Plan of Care Patient             Patient will benefit from skilled therapeutic intervention in order to improve the following deficits and impairments:  Pain, Improper body mechanics, Increased fascial restricitons, Impaired flexibility, Decreased range of  motion, Decreased activity tolerance, Decreased strength, Hypomobility, Postural dysfunction  Visit Diagnosis: Cervicalgia  Low back pain, unspecified back pain laterality, unspecified chronicity, unspecified whether sciatica present  Pain in thoracic spine  Abnormal posture     Problem List 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 [place], [place], [address] Phone: [phone]   Fax:  [phone]  Name: Lauren Knox MRN: [id number] Date of Birth: [birth year]

## 2020-07-14 ENCOUNTER — Ambulatory Visit (HOSPITAL_COMMUNITY): Payer: Medicare HMO | Admitting: Physical Therapy

## 2020-07-14 ENCOUNTER — Other Ambulatory Visit: Payer: Self-pay

## 2020-07-14 DIAGNOSIS — M542 Cervicalgia: Secondary | ICD-10-CM | POA: Diagnosis not present

## 2020-07-14 DIAGNOSIS — R293 Abnormal posture: Secondary | ICD-10-CM

## 2020-07-14 DIAGNOSIS — M545 Low back pain, unspecified: Secondary | ICD-10-CM

## 2020-07-14 DIAGNOSIS — M546 Pain in thoracic spine: Secondary | ICD-10-CM

## 2020-07-14 NOTE — Therapy (Signed)
[facility] [address] [place], [place], [address] Phone: [phone]   Fax:  [phone]  Physical Therapy Treatment  Patient Details  Name: Lauren Knox MRN: [id number] Date of Birth: 01/14/1946 Referring Provider (PT): Pollyann Savoy MD  PHYSICAL THERAPY DISCHARGE SUMMARY  Visits from Start of Care: 15  Current functional level related to goals / functional outcomes: See below    Remaining deficits: See below    Education / Equipment: See assessment    Patient agrees to discharge. Patient goals were not met. Patient is being discharged due to lack of progress.   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   Subjective Assessment - 07/14/20 1041     Subjective Patient has been having familiy issues so it has been hard to "give a fair assessment". "It's still there"    Pertinent History arthritis, osteoporosis, spina bifida    Limitations Standing;Walking;House  hold activities    Patient Stated Goals Get a stronger back and neck    Currently in Pain? Yes    Pain Score 0-No pain                [facility] PT Assessment - 07/14/20 0001       Assessment   Medical Diagnosis --    Referring Provider (PT) Pollyann Savoy MD    Prior Therapy Yes      Home Environment   Living Environment Private residence    Living Arrangements Spouse/significant other      Prior Function   Level of Independence Independent      Cognition   Overall Cognitive Status Within Functional Limits for tasks assessed      Observation/Other Assessments   Focus on Therapeutic Outcomes (FOTO)  50% function   was 60%     Posture/Postural Control   Posture/Postural Control Postural limitations    Postural Limitations Rounded Shoulders                           [facility] Adult PT Treatment/Exercise - 07/14/20 0001       Shoulder Exercises: Standing   Extension Both;Theraband;10 reps    Theraband Level (Shoulder Extension) Level 4 (Blue)    Row Theraband;10 reps  Theraband Level (Shoulder Row) Level 4 (Blue)                      PT Short Term Goals - 07/14/20 1057       PT SHORT TERM GOAL #1   Title Patient will be independent with initial HEP and self-management strategies to improve functional outcomes    Baseline Reports compliance    Time 2    Period Weeks    Status Achieved    Target Date 06/07/20               PT Long Term Goals - 07/14/20 1057       PT LONG TERM GOAL #1   Title Patient will improve FOTO score by at least 5% to indicate improvement in functional outcomes    Baseline Current 50%    Time 4    Period Weeks    Status Not Met      PT LONG TERM GOAL #2   Title Patient will report at least 75% overall improvement in subjective complaint to indicate improvement in ability to perform ADLs.    Baseline Reports 70%    Time 4    Period Weeks    Status Not Met      PT LONG TERM GOAL #3   Title  Patient will be able to perform [facility] ADLs with no increased back pain for increased functional ability.    Baseline As of today reports this still hurts    Time 4    Period Weeks    Status Not Met      PT LONG TERM GOAL #4   Title Patient will show improved postural awareness and be indpendant with advanced HEP for improved functional outcomes    Baseline Shows some improved return, but requires ongoing verbal cues and reminders for form    Time 4    Period Weeks    Status Partially Met                   Plan - 07/14/20 1111     Clinical Impression Statement Patient had made fairly good progress with previous therapy but has since reached plateau. Some long term goals have shown decrease in function since last reassess. Patient describes ongoing spontaneous pain with no clear mechanism (will describe as arm movement, but no pain observed in clinic with assessed ROM/ unable to recreate. other times will state pain is insidious).  Have addressed cervical, thoracic and glenohumeral mobility with no inciting contribution. Also addressed cervical, suboccipital and scapular muscle restrictions with no obvious benefit. Patient states therapy exercise is beneficial when she does them but reports no lasting effect. Discussed several times that this may well be postural which would explain the aforementioned. Patient feels it is something more. Discussed return to referring provider for further assessment. Patient being DC from therapy today with goals partially met/ not met. Patient encouraged to follow up with therapy services with any further questions or concerns.    Personal Factors and Comorbidities Comorbidity 2    Examination-Activity Limitations Reach Overhead;Carry;Lift    Examination-Participation Restrictions Cleaning;Community Activity;Yard Work    Stability/Clinical Decision Making Stable/Uncomplicated    Rehab Potential Good    PT Frequency --    PT Duration --    PT  Treatment/Interventions ADLs/Self Care Home Management;Aquatic Therapy;Biofeedback;Moist Heat;Stair training;Gait training;Functional mobility training;Traction;Ultrasound;Canalith Repostioning;Cryotherapy;Parrafin;Neuromuscular re-education;Manual techniques;Dry needling;Passive range of motion;Scar mobilization;Vasopneumatic Device;Vestibular;Joint Manipulations;Spinal Manipulations;Visual/perceptual remediation/compensation;Energy conservation;Manual lymph drainage;Splinting;Taping;Patient/family education;Orthotic Fit/Training;Compression bandaging;Balance  training;Therapeutic exercise;Therapeutic activities;Fluidtherapy;Contrast Bath;Electrical Stimulation;Iontophoresis 36m/ml Dexamethasone;DME Instruction    PT Next Visit Plan DC to HEP, return to referring provider    PT Home Exercise Plan Eval: chin tuck at wall, scap retraction, UT stretch 5/17 band row, horiz shoulder abduction, ab brace 5/24 shoulder extension 5/26 scap ret with [facility], shoulder PNF d2 in supine 6/7 lateral sidebend, x to v 6/14 tspine mobs with towel roll 6/21 thoracic excursions    Consulted and Agree with Plan of Care Patient             Patient will benefit from skilled therapeutic intervention in order to improve the following deficits and impairments:  Pain, Improper body mechanics, Increased fascial restricitons, Impaired flexibility, Decreased range of motion, Decreased activity tolerance, Decreased strength, Hypomobility, Postural dysfunction  Visit Diagnosis: Cervicalgia  Low back pain, unspecified back pain laterality, unspecified chronicity, unspecified whether sciatica present  Pain in thoracic spine  Abnormal posture     Problem List 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

## 2020-07-19 ENCOUNTER — Ambulatory Visit (HOSPITAL_COMMUNITY): Payer: Medicare HMO

## 2020-07-21 ENCOUNTER — Ambulatory Visit (HOSPITAL_COMMUNITY): Payer: Medicare HMO | Admitting: Physical Therapy

## 2020-08-31 ENCOUNTER — Other Ambulatory Visit: Payer: Self-pay | Admitting: Physician Assistant

## 2020-08-31 NOTE — Telephone Encounter (Signed)
Next Visit: 11/01/2020   Last Visit: 05/03/2020   Last Fill: 04/11/2020  Dx: Trapezius muscle spasm    Current Dose per office note on 05/03/2020: Robaxin 500 mg 1 tablet daily PRN   Okay to refill Robaxin?

## 2020-10-18 ENCOUNTER — Telehealth: Payer: Self-pay | Admitting: Pharmacist

## 2020-10-18 DIAGNOSIS — M810 Age-related osteoporosis without current pathological fracture: Secondary | ICD-10-CM

## 2020-10-18 DIAGNOSIS — Z79899 Other long term (current) drug therapy: Secondary | ICD-10-CM

## 2020-10-18 DIAGNOSIS — E559 Vitamin D deficiency, unspecified: Secondary | ICD-10-CM

## 2020-10-18 NOTE — Progress Notes (Signed)
Office Visit Note  Patient: Lauren Knox             Date of Birth: 1946-01-06           MRN: [id number]             PCP: Asencion Noble, MD Referring: Asencion Noble, MD Visit Date: 11/01/2020 Occupation: @GUAROCC @  Subjective:  Neck stiffness   History of Present Illness: Lauren Knox is a 75 y.o. female with history of osteoarthritis and DDD. She states she noticed a significant improvement in her neck pain and stiffness after completing PT in July.  She states about 2 months ago she started to have muscle spasms on the left side of her neck after performing certain exercises.  She states that she was experiencing muscle spasms intermittently for a couple of weeks.  She took methocarbamol 500 mg 1 tablet daily and tizanidine 4 mg at bedtime as needed for muscle spasms which alleviated her discomfort.  She also used a heating pad which alleviated some of her discomfort.  She states about 1 month ago she was cleaning out her nephew's apartment and started to have right-sided lower back muscle spasms.  She states that her symptoms have since resolved but were initially alleviated by taking the prescribed muscle relaxers.  She denies any other joint pain or joint swelling at this time.  She will be due to for her next reclast infusion at the end of October 2022.  She has been taking a calcium and vitamin D supplement daily.  She denies any recent falls or fractures.  She has some instability in her lower extremities especially her right ankle which was previously fractured.  Activities of Daily Living:  Patient reports morning stiffness for 5-10 minutes.   Patient Denies nocturnal pain.  Difficulty dressing/grooming: Denies Difficulty climbing stairs: Denies Difficulty getting out of chair: Reports Difficulty using hands for taps, buttons, cutlery, and/or writing: Reports  Review of Systems  Constitutional:  Positive for fatigue.  HENT:  Positive for mouth dryness and nose dryness.  Negative for mouth sores.   Eyes:  Positive for dryness. Negative for pain and itching.  Respiratory:  Negative for shortness of breath and difficulty breathing.   Cardiovascular:  Negative for chest pain and palpitations.  Gastrointestinal:  Negative for blood in stool, constipation and diarrhea.  Endocrine: Negative for increased urination.  Genitourinary:  Negative for difficulty urinating.  Musculoskeletal:  Positive for morning stiffness. Negative for joint pain, joint pain, joint swelling, myalgias, muscle tenderness and myalgias.  Skin:  Negative for color change, rash and redness.  Allergic/Immunologic: Negative for susceptible to infections.  Neurological:  Positive for memory loss. Negative for dizziness and headaches.  Hematological:  Positive for bruising/bleeding tendency.  Psychiatric/Behavioral:  Negative for confusion.    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   Muscle weakness (generalized) 09/17/2013   Scoliosis concern 09/17/2013   Stiffness of joint, not elsewhere classified, pelvic region and thigh 09/17/2013   Difficulty in walking(719.7) 09/17/2013    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  Osteoarthritis of hands, 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   Osteopenia    Raynaud's disease    Raynaud'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 01/27/2016   Rosacea 01/27/2016   Varicose veins    Vitamin D deficiency 01/27/2016    Family History  Problem Relation Age of Onset   Cancer Mother    Cancer Father    Heart attack Father     Cancer Brother    Past Surgical History:  Procedure Laterality Date   ABDOMINAL HYSTERECTOMY     CESAREAN SECTION     CHOLECYSTECTOMY     INTESTINAL BLOCKAGE     TONSILLECTOMY     Social History   Social History Narrative   Not on file   Immunization History  Administered Date(s) Administered   PFIZER(Purple Top)SARS-COV-2 Vaccination 02/21/2019, 03/14/2019, 11/19/2019     Objective: Vital Signs: BP (!) 155/78 (BP Location: Left Arm, Patient Position: Sitting, Cuff Size: Normal)   Pulse (!) 58   Ht 5\' 4"  (1.626 m)   Wt 144 lb 3.2 oz (65.4 kg)   BMI 24.75 kg/m    Physical Exam Vitals and nursing note reviewed.  Constitutional:      Appearance: She is well-developed.  HENT:     Head: Normocephalic and atraumatic.  Eyes:     Conjunctiva/sclera: Conjunctivae normal.  Pulmonary:     Effort: Pulmonary effort is normal.  Abdominal:     Palpations: Abdomen is soft.  Musculoskeletal:     Cervical back: Normal range of motion.  Skin:    General: Skin is warm and dry.     Capillary Refill: Capillary refill takes less than 2 seconds.  Neurological:     Mental Status: She is alert and oriented to person, place, and time.  Psychiatric:        Behavior: Behavior normal.     Musculoskeletal Exam: C-spine has limited ROM with lateral rotation.  Trapezius muscle tension and tenderness bilaterally. Postural thoracic kyphosis noted.  No midline spinal tenderness or SI joint tenderness.  Shoulder joints, elbow joints, wrist joints, MCPs, PIPs, and DIPs good ROM with no discomfort.  PIP and DIP thickening consistent with osteoarthritis of both hands. Hip joints have good ROM with no discomfort. Knee joints have good ROM with no discomfort. No warmth or effusion of knee joints.  Thickening of the right ankle joint with edema.  Left ankle has  good ROM with no tenderness or inflammation.    CDAI Exam: CDAI Score: -- Patient Global: --; Provider Global: -- Swollen: --; Tender:  -- Joint Exam 11/01/2020   No joint exam has been documented for this visit   There is currently no information documented on the homunculus. Go to the Rheumatology activity and complete the homunculus joint exam.  Investigation: No additional findings.  Imaging: No results found.  Recent Labs: Lab Results  Component Value Date   HGB 13.3 11/13/2019   NA 142 11/27/2019   K 4.8 11/27/2019   CL 106 11/27/2019   CO2 25 11/27/2019   GLUCOSE 100 11/27/2019   BUN 27 (H) 11/27/2019   CREATININE 0.98 (H) 11/27/2019   CALCIUM 9.9 11/27/2019   GFRAA 67 11/27/2019    Speciality Comments: Reclast - 04/30/16, 11/13/19  Procedures:  No procedures performed Allergies: Codeine and Lisinopril   Assessment / Plan:     Visit Diagnoses: Trapezius muscle spasm -She has some trapezius muscle tension and tenderness bilaterally today on examination today.  No muscle spasms were noted currently.  Overall she notices significant improvement in her  neck pain and stiffness after completing physical therapy in July 2022.  She has not had any symptoms of radiculopathy and has noticed improvement in her range of motion.  She continues to experience episodic muscle spasms in her trapezius muscles bilaterally.  The most recent episode was about 2 months ago on the left side of her neck.  She takes Robaxin 500 mg 1 tablet daily PRN and zanaflex 4 mg at bedtime PRN for muscle spasms which typically alleviate her symptoms.  She declined a refill at this time.  In the past she has had trigger point injections which alleviated her symptoms but has not needed an injection since 07/04/2018.  She declined trigger point injections today.  She was advised to notify us if he has episodes of muscle spasms become more frequent or severe.  Discussed the importance of continuing home exercises as instructed at physical therapy.  DDD (degenerative disc disease), cervical: Range of motion of her C-spine has improved significantly  since completing physical therapy in July 2022.  She has some stiffness with lateral rotation but is not experiencing any trapezius muscle spasms at this time.  She is encouraged to continue to perform home exercises daily.  She was advised to notify us if she develops any new or worsening symptoms.  DDD (degenerative disc disease), thoracic: No midline spinal tenderness.  Postural kyphosis of thoracolumbar region: Unchanged.  No midline spinal tenderness.  Spondylosis without myelopathy or radiculopathy, lumbar region - X-rays of the lumbar spine were obtained by Dr. Lorin Mercy on 01/17/17: Lumbar spondylosis with degenerative facet changes and mild disc space narrowing L3-S1.  She continues to experience intermittent discomfort in her lower back.  No symptoms of radiculopathy.  She experiences right-sided lower back muscle spasms intermittently.  She takes methocarbamol 500 mg 1 tablet daily as needed and tizanidine 4 mg at bedtime as needed for muscle spasms which alleviate her discomfort.  She does not need a refill at this time.  Primary osteoarthritis of both hands: She has PIP and DIP thickening consistent with osteoarthritis of both hands.  Discussed the importance of joint protection and muscle strengthening.  Primary osteoarthritis of both feet: She experiences some intermittent pain and stiffness in the right ankle which was previously fractured.  Thickening and edema of the right ankle was noted on examination today.  The left ankle had good range of motion with no tenderness or inflammation.  No tenderness over MTP joints.  Raynaud'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: Not currently active.  No digital ulcerations or signs of gangrene noted.  No signs of sclerodactyly noted.  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 IV Reclast 11/13/2019. previously treated with fosamax, which she could not tolerate, Reclast IV in 2018:  Postural  thoracic kyphosis was noted.  No midline spinal tenderness.  She has not had any recent falls or fractures.  She has some instability and issues with balance but declined referral to physical therapy for lower extremity muscle strengthening and fall prevention at this time.  Discussed the importance of performing resistive exercises on a daily basis. She will be due for her next Reclast infusion on or after 11/12/2020.  She previously tolerated Reclast without any side effects.  She has been taking a calcium and vitamin D supplement on a daily basis as recommended.  Vitamin D level along with CBC and CMP will be checked today prior to scheduling her next Reclast infusion. Her next DEXA will be due in  September 2023.  Vitamin D deficiency -She has been taking a calcium and vitamin D supplement on a daily basis.  Vitamin D level will be checked today prior to scheduling the next Reclast infusion.  Plan: VITAMIN D 25 Hydroxy (Vit-D Deficiency, Fractures)  Medication monitoring encounter -CBC, CMP, and vitamin D will be checked today prior to scheduling next Reclast infusion.  Plan: COMPLETE METABOLIC PANEL WITH GFR, CBC with Differential/Platelet, VITAMIN D 25 Hydroxy (Vit-D Deficiency, Fractures)  Other medical conditions are listed as follows:   History of rosacea  History of attention deficit disorder    Orders: Orders Placed This Encounter  Procedures   COMPLETE METABOLIC PANEL WITH GFR   CBC with Differential/Platelet   VITAMIN D 25 Hydroxy (Vit-D Deficiency, Fractures)    No orders of the defined types were placed in this encounter.     Follow-Up Instructions: Return in about 6 months (around 05/02/2021) for Osteoarthritis, DDD.   Ofilia Neas, PA-C  Note - This record has been created using Dragon software.  Chart creation errors have been sought, but may not always  have been located. Such creation errors do not reflect on  the standard of medical care.

## 2020-10-18 NOTE — Telephone Encounter (Signed)
Patient due for Reclast on or after 11/12/20.  Last infusion was 11/13/19. Prior to this, she had drug holiday after her infusion on 04/30/16.  DEXA on 09/29/19: Right femoral neck T-score -2.6 Next DEXA due September 2023  She has OV with Hazel Sams, PA-C on 11/01/20 and can have Reclast labs drawn at that visit. Future orders for CBC w diff, CMP w GFR, and Vitamin D placed today  Knox Saliva, PharmD, MPH, BCPS Clinical Pharmacist (Rheumatology and Pulmonology)

## 2020-11-01 ENCOUNTER — Encounter: Payer: Self-pay | Admitting: Physician Assistant

## 2020-11-01 ENCOUNTER — Other Ambulatory Visit: Payer: Self-pay

## 2020-11-01 ENCOUNTER — Ambulatory Visit: Payer: Medicare HMO | Admitting: Physician Assistant

## 2020-11-01 VITALS — BP 155/78 | HR 58 | Ht 64.0 in | Wt 144.2 lb

## 2020-11-01 DIAGNOSIS — Z5181 Encounter for therapeutic drug level monitoring: Secondary | ICD-10-CM

## 2020-11-01 DIAGNOSIS — M810 Age-related osteoporosis without current pathological fracture: Secondary | ICD-10-CM

## 2020-11-01 DIAGNOSIS — M4005 Postural kyphosis, thoracolumbar region: Secondary | ICD-10-CM | POA: Diagnosis not present

## 2020-11-01 DIAGNOSIS — M62838 Other muscle spasm: Secondary | ICD-10-CM

## 2020-11-01 DIAGNOSIS — I73 Raynaud'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: Secondary | ICD-10-CM

## 2020-11-01 DIAGNOSIS — M47816 Spondylosis without myelopathy or radiculopathy, lumbar region: Secondary | ICD-10-CM

## 2020-11-01 DIAGNOSIS — M5030 Other cervical disc degeneration, unspecified cervical region: Secondary | ICD-10-CM

## 2020-11-01 DIAGNOSIS — M19042 Primary osteoarthritis, left hand: Secondary | ICD-10-CM

## 2020-11-01 DIAGNOSIS — Z872 Personal history of diseases of the skin and subcutaneous tissue: Secondary | ICD-10-CM

## 2020-11-01 DIAGNOSIS — E559 Vitamin D deficiency, unspecified: Secondary | ICD-10-CM

## 2020-11-01 DIAGNOSIS — M5134 Other intervertebral disc degeneration, thoracic region: Secondary | ICD-10-CM | POA: Diagnosis not present

## 2020-11-01 DIAGNOSIS — Z8659 Personal history of other mental and behavioral disorders: Secondary | ICD-10-CM

## 2020-11-01 DIAGNOSIS — M19041 Primary osteoarthritis, right hand: Secondary | ICD-10-CM

## 2020-11-01 DIAGNOSIS — M19071 Primary osteoarthritis, right ankle and foot: Secondary | ICD-10-CM

## 2020-11-01 DIAGNOSIS — M19072 Primary osteoarthritis, left ankle and foot: Secondary | ICD-10-CM

## 2020-11-01 NOTE — Telephone Encounter (Signed)
Patient had CBC, CMP, and Vitamin D lab drawn at [facility] today, 11/01/20, with Hazel Sams, PA-C.  Will await labs to result prior to placing Reclast order with [facility]  Knox Saliva, PharmD, MPH, BCPS Clinical Pharmacist (Rheumatology and Pulmonology)

## 2020-11-02 LAB — CBC WITH DIFFERENTIAL/PLATELET
Absolute Monocytes: 670 cells/uL (ref 200–950)
Basophils Absolute: 46 cells/uL (ref 0–200)
Basophils Relative: 0.6 %
Eosinophils Absolute: 139 cells/uL (ref 15–500)
Eosinophils Relative: 1.8 %
HCT: 42.5 % (ref 35.0–45.0)
Hemoglobin: 14.7 g/dL (ref 11.7–15.5)
Lymphs Abs: 1817 cells/uL (ref 850–3900)
MCH: 33.3 pg — ABNORMAL HIGH (ref 27.0–33.0)
MCHC: 34.6 g/dL (ref 32.0–36.0)
MCV: 96.4 fL (ref 80.0–100.0)
MPV: 10 fL (ref 7.5–12.5)
Monocytes Relative: 8.7 %
Neutro Abs: 5028 cells/uL (ref 1500–7800)
Neutrophils Relative %: 65.3 %
Platelets: 396 10*3/uL (ref 140–400)
RBC: 4.41 10*6/uL (ref 3.80–5.10)
RDW: 12.6 % (ref 11.0–15.0)
Total Lymphocyte: 23.6 %
WBC: 7.7 10*3/uL (ref 3.8–10.8)

## 2020-11-02 LAB — COMPLETE METABOLIC PANEL WITH GFR
AG Ratio: 1.7 (calc) (ref 1.0–2.5)
ALT: 10 U/L (ref 6–29)
AST: 21 U/L (ref 10–35)
Albumin: 4.3 g/dL (ref 3.6–5.1)
Alkaline phosphatase (APISO): 86 U/L (ref 37–153)
BUN: 25 mg/dL (ref 7–25)
CO2: 28 mmol/L (ref 20–32)
Calcium: 10.2 mg/dL (ref 8.6–10.4)
Chloride: 103 mmol/L (ref 98–110)
Creat: 0.92 mg/dL (ref 0.60–1.00)
Globulin: 2.6 g/dL (calc) (ref 1.9–3.7)
Glucose, Bld: 90 mg/dL (ref 65–99)
Potassium: 4.9 mmol/L (ref 3.5–5.3)
Sodium: 139 mmol/L (ref 135–146)
Total Bilirubin: 0.7 mg/dL (ref 0.2–1.2)
Total Protein: 6.9 g/dL (ref 6.1–8.1)
eGFR: 66 mL/min/{1.73_m2} (ref >=60)

## 2020-11-02 LAB — VITAMIN D 25 HYDROXY (VIT D DEFICIENCY, FRACTURES): Vit D, 25-Hydroxy: 42 ng/mL (ref 30–100)

## 2020-11-02 NOTE — Progress Notes (Signed)
CBC and CMP WNL.  Vitamin D is WNL.

## 2020-11-02 NOTE — Telephone Encounter (Signed)
Please start Reclast BIV through medical benefit - patient has [facility]  Reclast [phone]  Dx: Age-related osteoporosis (M80.0)  No recent BIV (last was in 2018 by [facility]  Knox Saliva, PharmD, MPH, BCPS Clinical Pharmacist (Rheumatology and Pulmonology)

## 2020-11-03 NOTE — Telephone Encounter (Signed)
Findings of benefits investigation for?Reclast [id number]?:   Insurance:[employer]   Phone:??[phone]   Plan is ACTIVE   Plan? covers 100% of the infusion and predetermination is required?for J3489.   Ref# [id number]  Pre-determination phone line- [phone]- hold time was longer than 20 min.  Submitted a Part B Prior Authorization request to [place] for  Reclast  via CoverMyMeds. Will update once we receive a response.   Key: [id number]

## 2020-11-04 NOTE — Telephone Encounter (Signed)
Received notification from [facility] regarding a prior authorization for  Reclast . Authorization has been APPROVED from 11/03/20 to 11/03/21.   Approved for [facility] location  Authorization # [id number]

## 2020-11-07 ENCOUNTER — Telehealth: Payer: Self-pay

## 2020-11-07 NOTE — Telephone Encounter (Addendum)
Next infusion not yet scheduled for Reclast IV and due for updated orders. Diagnosis: age-related osteoporosis  Dose: 5mg  IV every 12 months  Last Clinic Visit: 11/01/20 Next Clinic Visit: 05/02/21  Last infusion: 11/13/19  Labs: Vitamin D, CBC, and CMP wnl on 11/01/20 to proceed with Reclast  Orders placed for Reclast IV x 1 dose along with premedication of acetaminophen and diphenhydramine to be administered 30 minutes before medication infusion.  Left VM for patient and provided with phone number for: [place]  Will follow-up to ensured scheduled and completed  Knox Saliva, PharmD, MPH, BCPS Clinical Pharmacist (Rheumatology and Pulmonology)

## 2020-11-07 NOTE — Telephone Encounter (Signed)
Patient called stating she was not able to schedule her Reclast infusion at [facility] in [place] because the order was put in for [facility] in [place].  Patient states she has always had her infusion in [place] and requested a return call.

## 2020-11-08 NOTE — Telephone Encounter (Signed)
Returned call to patient and provided with phone number for [facility] [phone]) to have Reclast scheduled. I've already spoke with Hoyle Sauer at [facility] and she states that orders are viewable on their end but patient has to call to have infusion scheduled  Knox Saliva, PharmD, MPH, BCPS Clinical Pharmacist (Rheumatology and Pulmonology)

## 2020-11-10 NOTE — Telephone Encounter (Signed)
Reclast scheduled for 11/17/20 at [facility]. Will f/u to ensure completed.  Knox Saliva, PharmD, MPH, BCPS Clinical Pharmacist (Rheumatology and Pulmonology)

## 2020-11-17 ENCOUNTER — Encounter (HOSPITAL_COMMUNITY)
Admission: RE | Admit: 2020-11-17 | Discharge: 2020-11-17 | Disposition: A | Discharge status: Home / Self Care | Payer: Medicare HMO | Source: Ambulatory Visit | Attending: Rheumatology | Admitting: Rheumatology

## 2020-11-17 ENCOUNTER — Encounter (HOSPITAL_COMMUNITY): Payer: Self-pay

## 2020-11-17 DIAGNOSIS — M810 Age-related osteoporosis without current pathological fracture: Secondary | ICD-10-CM | POA: Diagnosis not present

## 2020-11-17 MED ORDER — DIPHENHYDRAMINE HCL 25 MG PO CAPS
ORAL_CAPSULE | ORAL | Status: AC
  Filled 2020-11-17: qty 1

## 2020-11-17 MED ORDER — ACETAMINOPHEN 325 MG PO TABS
ORAL_TABLET | ORAL | Status: AC
  Filled 2020-11-17: qty 2

## 2020-11-17 MED ORDER — DIPHENHYDRAMINE HCL 25 MG PO CAPS: 25.0000 mg | ORAL_CAPSULE | Freq: Once | ORAL | Status: DC

## 2020-11-17 MED ORDER — SODIUM CHLORIDE 0.9 % IV SOLN: Freq: Once | INTRAVENOUS | Status: AC

## 2020-11-17 MED ORDER — ZOLEDRONIC ACID 5 MG/100ML IV SOLN
INTRAVENOUS | Status: AC
  Filled 2020-11-17: qty 100

## 2020-11-17 MED ORDER — ZOLEDRONIC ACID 5 MG/100ML IV SOLN
5.0000 mg | Freq: Once | INTRAVENOUS | Status: AC
  Administered 2020-11-17: 5 mg via INTRAVENOUS

## 2020-11-17 MED ORDER — ACETAMINOPHEN 325 MG PO TABS
650.0000 mg | ORAL_TABLET | Freq: Once | ORAL | Status: AC
  Administered 2020-11-17: 650 mg via ORAL

## 2021-01-15 HISTORY — PX: COLONOSCOPY: SHX174

## 2021-04-19 NOTE — Progress Notes (Signed)
? ?Office Visit Note ? ?Patient: Lauren Knox             ?Date of Birth: 01-06-1946           ?MRN: [id number]             ?PCP: Asencion Noble, MD ?Referring: Asencion Noble, MD ?Visit Date: 05/02/2021 ?Occupation: '@GUAROCC'$ @ ? ?Subjective:  ?Other (Right leg pain) ? ? ?History of Present Illness: Lauren Knox is a 76 y.o. female with history of osteoarthritis, degenerative disc disease and osteoporosis.  She states about 3 weeks ago she started having pain and discomfort in the right hip which has been radiating down into her right knee.  She states the pain gets worse when she is walking.  She has been walking on a regular basis.  She states when she goes uphill the pain is worse.  She has intermittent nocturnal pain.  She went to physical therapy for her spine which was helpful.  She states some of the lower back pain is coming back.  She had her last Reclast infusion on November 17, 2020 which she tolerated well.  She has been taking calcium and vitamin D and has been exercising on a regular basis. ? ?Activities of Daily Living:  ?Patient reports morning stiffness for  all day. ?Patient Denies nocturnal pain.  ?Difficulty dressing/grooming: Denies ?Difficulty climbing stairs: Reports ?Difficulty getting out of chair: Reports ?Difficulty using hands for taps, buttons, cutlery, and/or writing: Denies ? ?Review of Systems  ?Constitutional:  Positive for fatigue.  ?HENT:  Positive for mouth dryness and nose dryness. Negative for mouth sores.   ?Eyes:  Positive for dryness. Negative for pain and itching.  ?Respiratory:  Negative for difficulty breathing.   ?Cardiovascular:  Negative for chest pain and palpitations.  ?Gastrointestinal:  Negative for blood in stool, constipation and diarrhea.  ?Endocrine: Negative for increased urination.  ?Genitourinary:  Negative for difficulty urinating.  ?Musculoskeletal:  Positive for joint pain, joint pain, myalgias, morning stiffness, muscle tenderness and myalgias. Negative  for joint swelling.  ?Skin:  Positive for redness. Negative for color change, rash and sensitivity to sunlight.  ?     rosacea  ?Allergic/Immunologic: Negative for susceptible to infections.  ?Neurological:  Negative for dizziness, numbness, headaches and memory loss.  ?Hematological:  Negative for bruising/bleeding tendency.  ?Psychiatric/Behavioral:  Negative for depressed mood, confusion and sleep disturbance. The patient is not nervous/anxious.   ? ?PMFS History:  ?Patient Active Problem List  ? Diagnosis Date Noted  ? 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  ? Osteoarthritis of hands, 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  ? DDD (degenerative disc disease), 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  ? Raynaud'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 01/27/2016  ? ADD (attention deficit disorder) 01/27/2016  ? Rosacea 01/27/2016  ? Vitamin D deficiency 01/27/2016  ? 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 Muscle weakness (generalized) 09/17/2013  ? Scoliosis concern 09/17/2013  ? Stiffness of joint, not elsewhere classified, pelvic region and thigh 09/17/2013  ? Difficulty in walking(719.7) 09/17/2013  ?  ?Past Medical History:  ?Diagnosis Date  ? ADD (attention deficit disorder) 01/27/2016  ? DDD (degenerative disc disease), 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  ? 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  ? Osteoarthritis of hands, 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  ? Osteopenia   ? Raynaud's disease   ? Raynaud'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 01/27/2016  ? Rosacea 01/27/2016  ? Varicose veins   ? Vitamin D deficiency 01/27/2016  ?  ?Family History  ?Problem Relation Age of Onset  ? Cancer Mother   ? Cancer Father   ? Heart attack Father   ?  Cancer Brother   ? ?Past Surgical History:  ?Procedure Laterality Date  ? ABDOMINAL HYSTERECTOMY    ? CESAREAN SECTION    ? CHOLECYSTECTOMY    ? INTESTINAL BLOCKAGE    ? TONSILLECTOMY    ? ?Social History  ? ?Social History Narrative  ? Not on file  ? ?Immunization History  ?Administered Date(s) Administered  ? PFIZER(Purple Top)SARS-COV-2 Vaccination 02/21/2019, 03/14/2019, 11/19/2019  ?   ? ?Objective: ?Vital Signs: BP (!) 145/76 (BP Location: Left Arm, Patient Position: Sitting, Cuff Size: Normal)   Pulse (!) 55   Ht '5\' 4"'$  (1.626 m)   Wt 141 lb (64 kg)   BMI 24.20 kg/m?   ? ?Physical Exam ?Vitals and nursing note reviewed.  ?Constitutional:   ?   Appearance: She is well-developed.  ?HENT:  ?   Head: Normocephalic and atraumatic.  ?Eyes:  ?   Conjunctiva/sclera: Conjunctivae normal.  ?Cardiovascular:  ?   Rate and Rhythm: Normal rate and regular rhythm.  ?   Heart sounds: Normal heart sounds.  ?Pulmonary:  ?   Effort: Pulmonary effort is normal.  ?   Breath sounds: Normal breath sounds.  ?Abdominal:  ?   General: Bowel sounds are normal.  ?   Palpations: Abdomen is soft.  ?Musculoskeletal:  ?   Cervical back: Normal range of motion.  ?Lymphadenopathy:  ?   Cervical: No cervical adenopathy.  ?Skin: ?   General: Skin is warm and dry.  ?   Capillary Refill: Capillary refill takes less than 2 seconds.  ?Neurological:  ?   Mental Status: She is alert and oriented to person, place, and time.  ?Psychiatric:     ?   Behavior: Behavior normal.  ?  ? ?Musculoskeletal Exam: She had limited range of motion of her cervical spine.  She had discomfort range of motion of her lumbar spine.  Shoulder joints, elbow joints, wrist joints with good range of motion.  She had bilateral PIP and DIP thickening in her hands consistent with osteoarthritis.  She had tenderness on palpation of the right trochanteric bursa.  Hip joints with good range of motion without discomfort.  Knee joints in good range of motion without any warmth swelling or effusion.  She has thickening of her right ankle joint without synovitis.  There was no tenderness over MTPs. ? ?CDAI Exam: ?CDAI Score: -- ?Patient Global: --; Provider Global: -- ?Swollen: --; Tender: -- ?Joint Exam 05/02/2021  ? ?No joint exam has been documented for this visit  ? ?There is currently no information documented on the homunculus. Go to the Rheumatology activity and  complete the homunculus joint exam. ? ?Investigation: ?No additional findings. ? ?Imaging: ?No results found. ? ?Recent Labs: ?Lab Results  ?Component Value Date  ? WBC 7.7 11/01/2020  ? HGB 14.7 11/01/2020  ? PLT 396 11/01/2020  ? NA 139 11/01/2020  ? K 4.9 11/01/2020  ? CL 103 11/01/2020  ? CO2 28 11/01/2020  ? GLUCOSE 90 11/01/2020  ? BUN 25 11/01/2020  ? CREATININE 0.92 11/01/2020  ? BILITOT 0.7 11/01/2020  ? AST 21 11/01/2020  ? ALT 10 11/01/2020  ? PROT 6.9 11/01/2020  ? CALCIUM 10.2 11/01/2020  ? GFRAA 67 11/27/2019  ? ? ?Speciality Comments: Reclast - 04/30/16, 11/13/19,11/17/20 ? ?Procedures:  ?Large Joint Inj on 05/02/2021 11:36 AM ?Indications: pain ?Details: 27 G 1.5 in needle, lateral approach ? ?Arthrogram: No ? ?Medications: 40 mg triamcinolone acetonide 40 MG/ML; 1.5 mL lidocaine 1 % ?Aspirate: 0 mL ?Outcome:  tolerated well, no immediate complications ?Procedure, treatment alternatives, risks and benefits explained, specific risks discussed. Consent was given by the patient. Immediately prior to procedure a time out was called to verify the correct patient, procedure, equipment, support staff and site/side marked as required. Patient was prepped and draped in the usual sterile fashion.  ? ? ?Allergies: Codeine and Lisinopril  ? ?Assessment / Plan:     ?Visit Diagnoses: Trochanteric bursitis of right hip-she been experiencing pain and discomfort over the right trochanteric bursa over the last 3 weeks.  She had tenderness on palpation of.  She states that she has been walking uphill which causes the pain to be worse.  She has some nocturnal discomfort.  Detailed counseling trochanteric bursitis was provided.  A handout on IT band stretches was given and the exercises were demonstrated in the office.  As she was having a lot of discomfort, after different treatment options were discussed right trochanteric bursa was prepped and injected with lidocaine and Kenalog as described above.  She taught the  procedure well.  Postprocedure instructions were given.  I advised her to contact me in case her symptoms do not improve as she may benefit from physical therapy. ? ?Primary osteoarthritis of both hands-she has bilateral PIP and

## 2021-05-02 ENCOUNTER — Ambulatory Visit: Payer: Medicare HMO | Admitting: Rheumatology

## 2021-05-02 ENCOUNTER — Encounter: Payer: Self-pay | Admitting: Rheumatology

## 2021-05-02 VITALS — BP 145/76 | HR 55 | Ht 64.0 in | Wt 141.0 lb

## 2021-05-02 DIAGNOSIS — Z872 Personal history of diseases of the skin and subcutaneous tissue: Secondary | ICD-10-CM

## 2021-05-02 DIAGNOSIS — M19071 Primary osteoarthritis, right ankle and foot: Secondary | ICD-10-CM

## 2021-05-02 DIAGNOSIS — M7061 Trochanteric bursitis, right hip: Secondary | ICD-10-CM

## 2021-05-02 DIAGNOSIS — M5134 Other intervertebral disc degeneration, thoracic region: Secondary | ICD-10-CM

## 2021-05-02 DIAGNOSIS — M51369 Other intervertebral disc degeneration, lumbar region without mention of lumbar back pain or lower extremity pain: Secondary | ICD-10-CM

## 2021-05-02 DIAGNOSIS — M5030 Other cervical disc degeneration, unspecified cervical region: Secondary | ICD-10-CM | POA: Diagnosis not present

## 2021-05-02 DIAGNOSIS — M5136 Other intervertebral disc degeneration, lumbar region: Secondary | ICD-10-CM

## 2021-05-02 DIAGNOSIS — M62838 Other muscle spasm: Secondary | ICD-10-CM

## 2021-05-02 DIAGNOSIS — M19041 Primary osteoarthritis, right hand: Secondary | ICD-10-CM

## 2021-05-02 DIAGNOSIS — M810 Age-related osteoporosis without current pathological fracture: Secondary | ICD-10-CM

## 2021-05-02 DIAGNOSIS — M47816 Spondylosis without myelopathy or radiculopathy, lumbar region: Secondary | ICD-10-CM

## 2021-05-02 DIAGNOSIS — M19072 Primary osteoarthritis, left ankle and foot: Secondary | ICD-10-CM

## 2021-05-02 DIAGNOSIS — E559 Vitamin D deficiency, unspecified: Secondary | ICD-10-CM

## 2021-05-02 DIAGNOSIS — M4005 Postural kyphosis, thoracolumbar region: Secondary | ICD-10-CM

## 2021-05-02 DIAGNOSIS — Z8659 Personal history of other mental and behavioral disorders: Secondary | ICD-10-CM

## 2021-05-02 DIAGNOSIS — I73 Raynaud'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: Secondary | ICD-10-CM

## 2021-05-02 DIAGNOSIS — M19042 Primary osteoarthritis, left hand: Secondary | ICD-10-CM

## 2021-05-02 MED ORDER — TRIAMCINOLONE ACETONIDE 40 MG/ML IJ SUSP
40.0000 mg | INTRAMUSCULAR | Status: AC | PRN
  Administered 2021-05-02: 40 mg via INTRA_ARTICULAR

## 2021-05-02 MED ORDER — LIDOCAINE HCL 1 % IJ SOLN
1.5000 mL | INTRAMUSCULAR | Status: AC | PRN
  Administered 2021-05-02: 1.5 mL

## 2021-05-02 NOTE — Patient Instructions (Signed)
Iliotibial Band Syndrome Rehab Ask your health care provider which exercises are safe for you. Do exercises exactly as told by your health care provider and adjust them as directed. It is normal to feel mild stretching, pulling, tightness, or discomfort as you do these exercises. Stop right away if you feel sudden pain or your pain gets significantly worse. Do not begin these exercises until told by your health care provider. Stretching and range-of-motion exercises These exercises warm up your muscles and joints and improve the movement and flexibility of your hip and pelvis. Quadriceps stretch, prone  Lie on your abdomen (prone position) on a firm surface, such as a bed or padded floor. Bend your left / right knee and reach back to hold your ankle or pant leg. If you cannot reach your ankle or pant leg, loop a belt around your foot and grab the belt instead. Gently pull your heel toward your buttocks. Your knee should not slide out to the side. You should feel a stretch in the front of your thigh and knee (quadriceps). Hold this position for __________ seconds. Repeat __________ times. Complete this exercise __________ times a day. Iliotibial band stretch An iliotibial band is a strong band of muscle tissue that runs from the outer side of your hip to the outer side of your thigh and knee. Lie on your side with your left / right leg in the top position. Bend both of your knees and grab your left / right ankle. Stretch out your bottom arm to help you balance. Slowly bring your top knee back so your thigh goes behind your trunk. Slowly lower your top leg toward the floor until you feel a gentle stretch on the outside of your left / right hip and thigh. If you do not feel a stretch and your knee will not fall farther, place the heel of your other foot on top of your knee and pull your knee down toward the floor with your foot. Hold this position for __________ seconds. Repeat __________ times.  Complete this exercise __________ times a day. Strengthening exercises These exercises build strength and endurance in your hip and pelvis. Endurance is the ability to use your muscles for a long time, even after they get tired. Straight leg raises, side-lying This exercise strengthens the muscles that rotate the leg at the hip and move it away from your body (hip abductors). Lie on your side with your left / right leg in the top position. Lie so your head, shoulder, hip, and knee line up. You may bend your bottom knee to help you balance. Roll your hips slightly forward so your hips are stacked directly over each other and your left / right knee is facing forward. Tense the muscles in your outer thigh and lift your top leg 4-6 inches (10-15 cm). Hold this position for __________ seconds. Slowly lower your leg to return to the starting position. Let your muscles relax completely before doing another repetition. Repeat __________ times. Complete this exercise __________ times a day. Leg raises, prone This exercise strengthens the muscles that move the hips backward (hip extensors). Lie on your abdomen (prone position) on your bed or a firm surface. You can put a pillow under your hips if that is more comfortable for your lower back. Bend your left / right knee so your foot is straight up in the air. Squeeze your buttocks muscles and lift your left / right thigh off the bed. Do not let your back arch. Tense   your thigh muscle as hard as you can without increasing any knee pain. Hold this position for __________ seconds. Slowly lower your leg to return to the starting position and allow it to relax completely. Repeat __________ times. Complete this exercise __________ times a day. Hip hike Stand sideways on a bottom step. Stand on your left / right leg with your other foot unsupported next to the step. You can hold on to a railing or wall for balance if needed. Keep your knees straight and your  torso square. Then lift your left / right hip up toward the ceiling. Slowly let your left / right hip lower toward the floor, past the starting position. Your foot should get closer to the floor. Do not lean or bend your knees. Repeat __________ times. Complete this exercise __________ times a day. This information is not intended to replace advice given to you by your health care provider. Make sure you discuss any questions you have with your health care provider. Document Revised: 03/11/2019 Document Reviewed: 03/11/2019 Elsevier Patient Education  2023 [employer]  

## 2021-05-22 ENCOUNTER — Telehealth: Payer: Self-pay

## 2021-05-22 DIAGNOSIS — M7061 Trochanteric bursitis, right hip: Secondary | ICD-10-CM

## 2021-05-22 NOTE — Telephone Encounter (Signed)
Ok to place referral to PT.

## 2021-05-22 NOTE — Telephone Encounter (Signed)
Patient called stating Dr. Estanislado Pandy wanted her to contact the office if she is still experiencing pain in her right hip.  Patient states the injection didn't really help with the pain and she has been doing the exercises.  Patient requested a return call.   ?

## 2021-05-22 NOTE — Telephone Encounter (Signed)
Patient is in agreement for physical therapy. She prefers to go to [place]. Referral placed.  ?

## 2021-06-08 ENCOUNTER — Encounter (HOSPITAL_COMMUNITY): Payer: Self-pay | Admitting: Physical Therapy

## 2021-06-08 ENCOUNTER — Ambulatory Visit (HOSPITAL_COMMUNITY): Payer: Medicare HMO | Attending: Physician Assistant | Admitting: Physical Therapy

## 2021-06-08 DIAGNOSIS — R29898 Other symptoms and signs involving the musculoskeletal system: Secondary | ICD-10-CM | POA: Insufficient documentation

## 2021-06-08 DIAGNOSIS — M7061 Trochanteric bursitis, right hip: Secondary | ICD-10-CM | POA: Insufficient documentation

## 2021-06-08 DIAGNOSIS — M25551 Pain in right hip: Secondary | ICD-10-CM | POA: Diagnosis present

## 2021-06-08 DIAGNOSIS — R2689 Other abnormalities of gait and mobility: Secondary | ICD-10-CM | POA: Insufficient documentation

## 2021-06-08 DIAGNOSIS — M6281 Muscle weakness (generalized): Secondary | ICD-10-CM | POA: Insufficient documentation

## 2021-06-08 NOTE — Therapy (Signed)
OUTPATIENT PHYSICAL THERAPY LOWER EXTREMITY EVALUATION   Patient Name: Lauren Knox MRN: [id number] DOB:1945/12/01, 76 y.o., female Today's Date: 06/08/2021   PT End of Session - 06/08/21 1138     Visit Number 1    Number of Visits 12    Date for PT Re-Evaluation 07/20/21    Authorization Type [employer] Medicare  (no auth , no vl)    Progress Note Due on Visit 10    PT Start Time 1138    PT Stop Time 1215    PT Time Calculation (min) 37 min    Activity Tolerance Patient tolerated treatment well    Behavior During Therapy [facility] for tasks assessed/performed             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   Osteoarthritis of hands, 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   Osteopenia    Raynaud's disease    Raynaud'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 01/27/2016   Rosacea 01/27/2016   Varicose veins    Vitamin D deficiency 01/27/2016   Past Surgical History:  Procedure Laterality Date   ABDOMINAL HYSTERECTOMY     CESAREAN SECTION     CHOLECYSTECTOMY     INTESTINAL BLOCKAGE     TONSILLECTOMY     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   Muscle weakness (generalized) 09/17/2013   Scoliosis concern 09/17/2013   Stiffness of joint, not elsewhere classified, pelvic region and thigh 09/17/2013   Difficulty in walking(719.7) 09/17/2013    PCP: Asencion Noble MD  REFERRING PROVIDER: Ofilia Neas, PA-C   REFERRING DIAG: M70.61 (ICD-10-CM) - Trochanteric bursitis of right hip   THERAPY DIAG:  Pain in right hip  Muscle weakness (generalized)  Other  abnormalities of gait and mobility  Other symptoms and signs involving the musculoskeletal system  Rationale for Evaluation and Treatment Rehabilitation  ONSET DATE: April 2023  SUBJECTIVE:   SUBJECTIVE STATEMENT: She thought her knee was bothering her but it might be her hip. Symptoms started soon before follow up with rheumatologist. They gave her hip injection and some exercises but it didn't help. Symptoms increase with walking, stairs. Symptoms from hip to knee and feels like a stretch.  PERTINENT HISTORY: history of osteoarthritis, degenerative disc disease and osteoporosis   PAIN:  Are you having pain? Yes: NPRS scale: 0/10 rest,  activity 8/10 Pain location: R hip Pain description: stretch, hurts Aggravating factors: walking/stairs Relieving factors: rest  PRECAUTIONS: None  WEIGHT BEARING RESTRICTIONS No  FALLS:  Has patient fallen in last 6 months? No  LIVING ENVIRONMENT: Lives with: lives with their spouse Lives in: House/apartment Stairs: Yes: External: 1 steps; on right going up and on left going up Has following equipment at home: None  OCCUPATION: Retired  PLOF: Independent  PATIENT GOALS Hip to feel better   OBJECTIVE:    PATIENT SURVEYS:  FOTO 44%function  COGNITION:  Overall cognitive status: Within functional limits for tasks assessed     SENSATION: WFL    POSTURE: rounded shoulders and forward head  PALPATION: TTP R glute med/min, slight glute max on R, no tenderness throughout rest of  R hip or L   LOWER EXTREMITY ROM:  Active ROM Right eval Left eval  Hip flexion    Hip extension    Hip abduction    Hip adduction    Hip internal rotation    Hip external rotation    Knee flexion    Knee extension    Ankle dorsiflexion    Ankle plantarflexion    Ankle inversion    Ankle eversion     (Blank rows = not tested)  LOWER EXTREMITY MMT:  MMT Right eval Left eval  Hip flexion 4/5 4/5  Hip extension 3+/5 4-/5  Hip  abduction 3+/5 3+/5  Hip adduction    Hip internal rotation    Hip external rotation    Knee flexion 4+/5 5/5  Knee extension 4+/5 5/5  Ankle dorsiflexion 5/5 5/5  Ankle plantarflexion    Ankle inversion    Ankle eversion     (Blank rows = not tested)    FUNCTIONAL TESTS:  5 times sit to stand: 9.29 seconds without UE support, relies on LLE >R with slight weight shift 2 minute walk test: 410 feet  GAIT: Distance walked: 410 feet Assistive device utilized: None Level of assistance: Complete Independence Comments: 2MWT, Trendelenburg on R, c/o "pulling" in R hip    TODAY'S TREATMENT: 06/08/21 Clam 2x 10 Bridge 2x 10    PATIENT EDUCATION:  Education details: Patient educated on exam findings, POC, scope of PT, HEP, and hip pathology, importance of hip strength. Person educated: Patient Education method: Explanation, Demonstration, and Handouts Education comprehension: verbalized understanding, returned demonstration, verbal cues required, and tactile cues required  HOME EXERCISE PROGRAM: 5/25/23Access Code: [email or web address] - Supine Bridge  - 1 x daily - 7 x weekly - 2 sets - 10 reps - Clamshell (Mirrored)  - 1 x daily - 7 x weekly - 2 sets - 10 reps  ASSESSMENT:  CLINICAL IMPRESSION: Patient a 76 y.o. y.o. female who was seen today for physical therapy evaluation and treatment for Trochanteric bursitis of R hip. Patient presents with pain limited deficits in R hip strength, ROM, endurance, activity tolerance, gait, balance, and functional mobility with ADL. Patient is having to modify and restrict ADL as indicated by outcome measure score as well as subjective information and objective measures which is affecting overall participation. Patient will benefit from skilled physical therapy in order to improve function and reduce impairment.    OBJECTIVE IMPAIRMENTS Abnormal gait, decreased activity tolerance, decreased balance, decreased endurance, decreased mobility, difficulty  walking, decreased ROM, decreased strength, improper body mechanics, and pain.   ACTIVITY LIMITATIONS carrying, lifting, bending, standing, squatting, stairs, transfers, bed mobility, and locomotion level  PARTICIPATION LIMITATIONS: meal prep, cleaning, laundry, shopping, community activity, and yard work  PERSONAL FACTORS Age, Fitness, Time since onset of injury/illness/exacerbation, and 3+ comorbidities: Arthritis, Back pain, Depression, High Blood Pressure, Osteoporosis  are also affecting patient's functional outcome.   REHAB POTENTIAL: Good  CLINICAL DECISION MAKING: Stable/uncomplicated  EVALUATION COMPLEXITY: Low   GOALS: Goals reviewed with patient? Yes  SHORT TERM GOALS: Target date: 06/29/2021  Patient will be independent with HEP in order to improve functional outcomes. Baseline:  Goal status: INITIAL  2.  Patient will report at least 25% improvement in symptoms for improved quality of life. Baseline:  Goal status: INITIAL  3.  Patient will be able to transfer to standing without UE support with equal weight distrubution.  Baseline: slight L>R Goal status: INITIAL   LONG TERM GOALS: Target date: 07/20/2021  Patient will report at least 75% improvement in symptoms for improved quality of life. Baseline:  Goal status: INITIAL  2.  Patient will improve FOTO score by at least 15 points in order to indicate improved tolerance to activity. Baseline: 44% function Goal status: INITIAL  3.  Patient will be able to navigate stairs with reciprocal pattern without compensation in order to demonstrate improved LE strength. Baseline: see above Goal status: INITIAL  4.  Patient will be able to ambulate at least 450 feet in 2MWT without deviation  in order to demonstrate improved tolerance to activity. Baseline: 410 feet with Trendelenburg  Goal status: INITIAL  5.   Patient will demonstrate grade of 5/5 MMT grade in all tested musculature as evidence of improved strength  to assist with stair ambulation and gait.  Baseline: see MMT Goal status: INITIAL    PLAN: PT FREQUENCY: 2x/week  PT DURATION: 6 weeks  PLANNED INTERVENTIONS: Therapeutic exercises, Therapeutic activity, Neuromuscular re-education, Balance training, Gait training, Patient/Family education, Joint manipulation, Joint mobilization, Stair training, Orthotic/Fit training, DME instructions, Aquatic Therapy, Dry Needling, Electrical stimulation, Spinal manipulation, Spinal mobilization, Cryotherapy, Moist heat, Compression bandaging, scar mobilization, Splintting, Taping, Traction, Ultrasound, Ionotophoresis '4mg'$ /ml Dexamethasone, and Manual therapy   PLAN FOR NEXT SESSION: hip strength, functional strength, balance training   Mearl Latin, PT 06/08/2021, 12:48 PM

## 2021-06-09 ENCOUNTER — Ambulatory Visit (HOSPITAL_COMMUNITY): Payer: Medicare HMO | Attending: Physician Assistant

## 2021-06-09 DIAGNOSIS — M6281 Muscle weakness (generalized): Secondary | ICD-10-CM | POA: Insufficient documentation

## 2021-06-09 DIAGNOSIS — R2689 Other abnormalities of gait and mobility: Secondary | ICD-10-CM | POA: Diagnosis present

## 2021-06-09 DIAGNOSIS — M25551 Pain in right hip: Secondary | ICD-10-CM | POA: Diagnosis present

## 2021-06-09 NOTE — Therapy (Signed)
OUTPATIENT PHYSICAL THERAPY LOWER EXTREMITY EVALUATION   Patient Name: Lauren Knox MRN: [id number] DOB:25-Sep-1945, 76 y.o., female Today's Date: 06/09/2021   PT End of Session - 06/09/21 1124     Visit Number 2    Number of Visits 12    Date for PT Re-Evaluation 07/20/21    Authorization Type [employer] Medicare  (no auth , no vl)    Progress Note Due on Visit 10    PT Start Time 1120    PT Stop Time 1200    PT Time Calculation (min) 40 min    Activity Tolerance Patient tolerated treatment well    Behavior During Therapy [facility] for tasks assessed/performed              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   Osteoarthritis of hands, 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   Osteopenia    Raynaud's disease    Raynaud'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 01/27/2016   Rosacea 01/27/2016   Varicose veins    Vitamin D deficiency 01/27/2016   Past Surgical History:  Procedure Laterality Date   ABDOMINAL HYSTERECTOMY     CESAREAN SECTION     CHOLECYSTECTOMY     INTESTINAL BLOCKAGE     TONSILLECTOMY     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   Muscle weakness (generalized) 09/17/2013   Scoliosis concern 09/17/2013   Stiffness of joint, not elsewhere classified, pelvic region and thigh 09/17/2013   Difficulty in walking(719.7) 09/17/2013    PCP: Asencion Noble MD  REFERRING PROVIDER: Ofilia Neas, PA-C   REFERRING DIAG: M70.61 (ICD-10-CM) - Trochanteric bursitis of right hip   THERAPY DIAG:  Pain in right hip  Muscle weakness (generalized)  Other  abnormalities of gait and mobility  Rationale for Evaluation and Treatment Rehabilitation  ONSET DATE: April 2023  SUBJECTIVE:   SUBJECTIVE STATEMENT: Compliant with HEP; reports soreness Right hip and leg only PERTINENT HISTORY: history of osteoarthritis, degenerative disc disease and osteoporosis   PAIN:  Are you having pain? Yes: NPRS scale: 0/10 rest,  activity 8/10 Pain location: R hip Pain description: stretch, hurts Aggravating factors: walking/stairs Relieving factors: rest  PRECAUTIONS: None  WEIGHT BEARING RESTRICTIONS No  FALLS:  Has patient fallen in last 6 months? No  LIVING ENVIRONMENT: Lives with: lives with their spouse Lives in: House/apartment Stairs: Yes: External: 1 steps; on right going up and on left going up Has following equipment at home: None  OCCUPATION: Retired  PLOF: Independent  PATIENT GOALS Hip to feel better   OBJECTIVE:    PATIENT SURVEYS:  FOTO 44%function  COGNITION:  Overall cognitive status: Within functional limits for tasks assessed     SENSATION: WFL    POSTURE: rounded shoulders and forward head  PALPATION: TTP R glute med/min, slight glute max on R, no tenderness throughout rest of R hip or L   LOWER EXTREMITY ROM:  Active ROM Right eval Left eval  Hip flexion    Hip extension    Hip abduction    Hip adduction    Hip internal rotation    Hip external rotation  Knee flexion    Knee extension    Ankle dorsiflexion    Ankle plantarflexion    Ankle inversion    Ankle eversion     (Blank rows = not tested)  LOWER EXTREMITY MMT:  MMT Right eval Left eval  Hip flexion 4/5 4/5  Hip extension 3+/5 4-/5  Hip abduction 3+/5 3+/5  Hip adduction    Hip internal rotation    Hip external rotation    Knee flexion 4+/5 5/5  Knee extension 4+/5 5/5  Ankle dorsiflexion 5/5 5/5  Ankle plantarflexion    Ankle inversion    Ankle eversion     (Blank rows = not tested)    FUNCTIONAL TESTS:  5  times sit to stand: 9.29 seconds without UE support, relies on LLE >R with slight weight shift 2 minute walk test: 410 feet  GAIT: Distance walked: 410 feet Assistive device utilized: None Level of assistance: Complete Independence Comments: 2MWT, Trendelenburg on R, c/o "pulling" in R hip    TODAY'S TREATMENT: 06/09/21 Bridge 2 x 10 Bridge with ball squeeze for hip Adduction x 10 Bridge with belt for hip Abduction x 10 Sidelying clam 2 x 10  06/08/21 Clam 2x 10 Bridge 2x 10    PATIENT EDUCATION:  Education details: Patient educated on exam findings, POC, scope of PT, HEP, and hip pathology, importance of hip strength. Person educated: Patient Education method: Explanation, Demonstration, and Handouts Education comprehension: verbalized understanding, returned demonstration, verbal cues required, and tactile cues required  HOME EXERCISE PROGRAM: Access Code: [email or web address] URL: [email or web address] Date: 06/09/2021 Prepared by: [facility] - Rehab  Exercises - Supine Bridge with Mini Swiss Ball Between Knees  - 2-3 x daily - 7 x weekly - 1 sets - 10 reps - Bridge with Hip Abduction and Resistance  - 2-3 x daily - 7 x weekly - 1 sets - 10 reps - Heel Raises with Counter Support  - 2-3 x daily - 7 x weekly - 1 sets - 10 reps - Standing Hip Extension with Counter Support  - 2-3 x daily - 7 x weekly - 1 sets - 10 reps 5/25/23Access Code: [email or web address] - Supine Bridge  - 1 x daily - 7 x weekly - 2 sets - 10 reps - Clamshell (Mirrored)  - 1 x daily - 7 x weekly - 2 sets - 10 reps  ASSESSMENT:  CLINICAL IMPRESSION: Started today's session with review of goals and HEP.  Patient verbalizes understanding and agreement of goals and HEP.  Progressing hip stability exercise today. Added heel raises to work on balance and progressed to standing strengthening of hip extension; patient with a hard time isolating hip extension movement in standing. Patient will benefit from skilled physical  therapy in order to improve function and reduce impairment.    OBJECTIVE IMPAIRMENTS Abnormal gait, decreased activity tolerance, decreased balance, decreased endurance, decreased mobility, difficulty walking, decreased ROM, decreased strength, improper body mechanics, and pain.   ACTIVITY LIMITATIONS carrying, lifting, bending, standing, squatting, stairs, transfers, bed mobility, and locomotion level  PARTICIPATION LIMITATIONS: meal prep, cleaning, laundry, shopping, community activity, and yard work  PERSONAL FACTORS Age, Fitness, Time since onset of injury/illness/exacerbation, and 3+ comorbidities: Arthritis, Back pain, Depression, High Blood Pressure, Osteoporosis  are also affecting patient's functional outcome.   REHAB POTENTIAL: Good  CLINICAL DECISION MAKING: Stable/uncomplicated  EVALUATION COMPLEXITY: Low   GOALS: Goals reviewed with patient? Yes  SHORT TERM GOALS: Target date: 06/29/2021  Patient will be independent with HEP in order to improve  functional outcomes. Baseline:  Goal status: met 2.  Patient will report at least 25% improvement in symptoms for improved quality of life. Baseline:  Goal status: ongoing  3.  Patient will be able to transfer to standing without UE support with equal weight distrubution.  Baseline: slight L>R Goal status: ongoing   LONG TERM GOALS: Target date: 07/21/2021  Patient will report at least 75% improvement in symptoms for improved quality of life. Baseline:  Goal status: ongoing  2.  Patient will improve FOTO score by at least 15 points in order to indicate improved tolerance to activity. Baseline: 44% function Goal status: ongoing  3.  Patient will be able to navigate stairs with reciprocal pattern without compensation in order to demonstrate improved LE strength. Baseline: see above Goal status: ongoing  4.  Patient will be able to ambulate at least 450 feet in 2MWT without deviation  in order to demonstrate improved  tolerance to activity. Baseline: 410 feet with Trendelenburg  Goal status: ongoing  5.   Patient will demonstrate grade of 5/5 MMT grade in all tested musculature as evidence of improved strength to assist with stair ambulation and gait.  Baseline: see MMT Goal status: ongoing    PLAN: PT FREQUENCY: 2x/week  PT DURATION: 6 weeks  PLANNED INTERVENTIONS: Therapeutic exercises, Therapeutic activity, Neuromuscular re-education, Balance training, Gait training, Patient/Family education, Joint manipulation, Joint mobilization, Stair training, Orthotic/Fit training, DME instructions, Aquatic Therapy, Dry Needling, Electrical stimulation, Spinal manipulation, Spinal mobilization, Cryotherapy, Moist heat, Compression bandaging, scar mobilization, Splintting, Taping, Traction, Ultrasound, Ionotophoresis 4m/ml Dexamethasone, and Manual therapy   PLAN FOR NEXT SESSION: hip strength, functional strength, balance training   12:00 PM, 06/09/21 Calli Bashor Small Consuelo Suthers MPT [facility] [place] #[phone]:[phone]

## 2021-06-14 ENCOUNTER — Ambulatory Visit (HOSPITAL_COMMUNITY): Payer: Medicare HMO | Attending: Physician Assistant

## 2021-06-14 ENCOUNTER — Encounter (HOSPITAL_COMMUNITY): Payer: Self-pay

## 2021-06-14 DIAGNOSIS — R2689 Other abnormalities of gait and mobility: Secondary | ICD-10-CM

## 2021-06-14 DIAGNOSIS — M7061 Trochanteric bursitis, right hip: Secondary | ICD-10-CM | POA: Insufficient documentation

## 2021-06-14 DIAGNOSIS — R29898 Other symptoms and signs involving the musculoskeletal system: Secondary | ICD-10-CM

## 2021-06-14 DIAGNOSIS — M6281 Muscle weakness (generalized): Secondary | ICD-10-CM

## 2021-06-14 DIAGNOSIS — M25551 Pain in right hip: Secondary | ICD-10-CM

## 2021-06-14 NOTE — Therapy (Signed)
OUTPATIENT PHYSICAL THERAPY LOWER EXTREMITY EVALUATION   Patient Name: Lauren Knox MRN: [id number] DOB:Jan 17, 1945, 76 y.o., female Today's Date: 06/14/2021   PT End of Session - 06/14/21 1352     Visit Number 3    Number of Visits 12    Date for PT Re-Evaluation 07/20/21    Authorization Type [employer]  (no auth , no vl)    Progress Note Due on Visit 10    PT Start Time 1352    PT Stop Time 1430    PT Time Calculation (min) 38 min    Activity Tolerance Patient tolerated treatment well    Behavior During Therapy WFL for tasks assessed/performed              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   Osteoarthritis of hands, 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   Osteopenia    Raynaud's disease    Raynaud'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 01/27/2016   Rosacea 01/27/2016   Varicose veins    Vitamin D deficiency 01/27/2016   Past Surgical History:  Procedure Laterality Date   ABDOMINAL HYSTERECTOMY     CESAREAN SECTION     CHOLECYSTECTOMY     INTESTINAL BLOCKAGE     TONSILLECTOMY     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   Muscle weakness (generalized) 09/17/2013   Scoliosis concern 09/17/2013   Stiffness of joint, not elsewhere classified, pelvic region and thigh 09/17/2013   Difficulty in walking(719.7) 09/17/2013    PCP: Asencion Noble MD  REFERRING PROVIDER: Ofilia Neas, PA-C   REFERRING DIAG: M70.61 (ICD-10-CM) - Trochanteric bursitis of right hip   THERAPY DIAG:  Pain in right hip  Muscle weakness (generalized)  Other  abnormalities of gait and mobility  Other symptoms and signs involving the musculoskeletal system  Rationale for Evaluation and Treatment Rehabilitation  ONSET DATE: April 2023  SUBJECTIVE:   SUBJECTIVE STATEMENT: Pt reports right hip pain if she bears weight into leg. Pain resolves quickly after shifting weight off.  PERTINENT HISTORY: history of osteoarthritis, degenerative disc disease and osteoporosis   PAIN:  Are you having pain? Yes: NPRS scale: 0/10 rest,  activity 8/10 Pain location: R hip Pain description: stretch, hurts Aggravating factors: walking/stairs Relieving factors: rest  PRECAUTIONS: None  WEIGHT BEARING RESTRICTIONS No  FALLS:  Has patient fallen in last 6 months? No  LIVING ENVIRONMENT: Lives with: lives with their spouse Lives in: House/apartment Stairs: Yes: External: 1 steps; on right going up and on left going up Has following equipment at home: None  OCCUPATION: Retired  PLOF: Independent  PATIENT GOALS Hip to feel better   OBJECTIVE:    PATIENT SURVEYS:  FOTO 44%function  COGNITION:  Overall cognitive status: Within functional limits for tasks assessed     SENSATION: WFL    POSTURE: rounded shoulders and forward head  PALPATION: TTP R glute med/min, slight glute max on R, no tenderness throughout rest of R hip or L   LOWER EXTREMITY ROM:  Active ROM Right eval Left eval  Hip flexion    Hip extension    Hip abduction  Hip adduction    Hip internal rotation    Hip external rotation    Knee flexion    Knee extension    Ankle dorsiflexion    Ankle plantarflexion    Ankle inversion    Ankle eversion     (Blank rows = not tested)  LOWER EXTREMITY MMT:  MMT Right eval Left eval  Hip flexion 4/5 4/5  Hip extension 3+/5 4-/5  Hip abduction 3+/5 3+/5  Hip adduction    Hip internal rotation    Hip external rotation    Knee flexion 4+/5 5/5  Knee extension 4+/5 5/5  Ankle dorsiflexion 5/5 5/5  Ankle  plantarflexion    Ankle inversion    Ankle eversion     (Blank rows = not tested)    FUNCTIONAL TESTS:  5 times sit to stand: 9.29 seconds without UE support, relies on LLE >R with slight weight shift 2 minute walk test: 410 feet  GAIT: Distance walked: 410 feet Assistive device utilized: None Level of assistance: Complete Independence Comments: 2MWT, Trendelenburg on R, c/o "pulling" in R hip    TODAY'S TREATMENT: 06/14/21 Hamstring stretch 10 seconds x5 performed B/L Figure 4 stretch  10 seconds x5 performed B/L Supine hip flexor/quad stretch  10 seconds x5 performed B/L Prone hip extensions x10 b/l Quadruped fire hydrants x10 b/l  1/2 kneeling with right LE as load bearing leg x1 min hold  Lateral and forward facing eccentric step downs off short stairs with ue support x10 each performed B/L Stair negotiation with slowed control to improve stability 6 steps x2  Side steps in ll bars x2 laps All at SBA  06/09/21 Bridge 2 x 10 Bridge with ball squeeze for hip Adduction x 10 Bridge with belt for hip Abduction x 10 Sidelying clam 2 x 10  06/08/21 Clam 2x 10 Bridge 2x 10    PATIENT EDUCATION:  Education details: updated HEP 06/14/21 Person educated: Patient Education method: Explanation, Demonstration, and Handouts Education comprehension: verbalized understanding, returned demonstration, verbal cues required, and tactile cues required  HOME EXERCISE PROGRAM:  06/14/21  Access Code: [email or web address] URL: [email or web address] Date: 06/14/2021 Prepared by: Leota Jacobsen  Exercises - Supine Hamstring Stretch with Strap  - 1 x daily - 7 x weekly - 1 sets - 5 reps - 10 hold - Supine Figure 4 Piriformis Stretch  - 1 x daily - 7 x weekly - 1 sets - 5 reps - 10 hold - Supine Quadriceps Stretch with Strap on Table  - 1 x daily - 7 x weekly - 1 sets - 5 reps - 10 hold  Access Code: [email or web address] URL: [email or web address] Date: 06/09/2021 Prepared by:  [facility] - Rehab  Exercises - Supine Bridge with Mini Swiss Ball Between Knees  - 2-3 x daily - 7 x weekly - 1 sets - 10 reps - Bridge with Hip Abduction and Resistance  - 2-3 x daily - 7 x weekly - 1 sets - 10 reps - Heel Raises with Counter Support  - 2-3 x daily - 7 x weekly - 1 sets - 10 reps - Standing Hip Extension with Counter Support  - 2-3 x daily - 7 x weekly - 1 sets - 10 reps 5/25/23Access Code: [email or web address] - Supine Bridge  - 1 x daily - 7 x weekly - 2 sets - 10 reps - Clamshell (Mirrored)  - 1 x daily - 7 x weekly - 2 sets - 10 reps  ASSESSMENT:  CLINICAL IMPRESSION: Patient reports elimination of pain  after performing stretches and a few glute activations. Pt able to complete stairs without pain. Instructed patient to try and start loading right LE slowly and more than she had been as she is feeling more pain free. Pt reported pain sometimes comes later in day. Instructed patient to try stretches at that time to see if they help. Handout given of updated HEP. Patient will benefit from skilled physical therapy in order to improve function and reduce impairment.    OBJECTIVE IMPAIRMENTS Abnormal gait, decreased activity tolerance, decreased balance, decreased endurance, decreased mobility, difficulty walking, decreased ROM, decreased strength, improper body mechanics, and pain.   ACTIVITY LIMITATIONS carrying, lifting, bending, standing, squatting, stairs, transfers, bed mobility, and locomotion level  PARTICIPATION LIMITATIONS: meal prep, cleaning, laundry, shopping, community activity, and yard work  PERSONAL FACTORS Age, Fitness, Time since onset of injury/illness/exacerbation, and 3+ comorbidities: Arthritis, Back pain, Depression, High Blood Pressure, Osteoporosis  are also affecting patient's functional outcome.   REHAB POTENTIAL: Good  CLINICAL DECISION MAKING: Stable/uncomplicated  EVALUATION COMPLEXITY: Low   GOALS: Goals reviewed with patient? Yes  SHORT TERM GOALS:  Target date: 06/29/2021  Patient will be independent with HEP in order to improve functional outcomes. Baseline:  Goal status: met 2.  Patient will report at least 25% improvement in symptoms for improved quality of life. Baseline:  Goal status: ongoing  3.  Patient will be able to transfer to standing without UE support with equal weight distrubution.  Baseline: slight L>R Goal status: ongoing   LONG TERM GOALS: Target date: 07/26/2021  Patient will report at least 75% improvement in symptoms for improved quality of life. Baseline:  Goal status: ongoing  2.  Patient will improve FOTO score by at least 15 points in order to indicate improved tolerance to activity. Baseline: 44% function Goal status: ongoing  3.  Patient will be able to navigate stairs with reciprocal pattern without compensation in order to demonstrate improved LE strength. Baseline: see above Goal status: ongoing  4.  Patient will be able to ambulate at least 450 feet in 2MWT without deviation  in order to demonstrate improved tolerance to activity. Baseline: 410 feet with Trendelenburg  Goal status: ongoing  5.   Patient will demonstrate grade of 5/5 MMT grade in all tested musculature as evidence of improved strength to assist with stair ambulation and gait.  Baseline: see MMT Goal status: ongoing    PLAN: PT FREQUENCY: 2x/week  PT DURATION: 6 weeks  PLANNED INTERVENTIONS: Therapeutic exercises, Therapeutic activity, Neuromuscular re-education, Balance training, Gait training, Patient/Family education, Joint manipulation, Joint mobilization, Stair training, Orthotic/Fit training, DME instructions, Aquatic Therapy, Dry Needling, Electrical stimulation, Spinal manipulation, Spinal mobilization, Cryotherapy, Moist heat, Compression bandaging, scar mobilization, Splintting, Taping, Traction, Ultrasound, Ionotophoresis 65m/ml Dexamethasone, and Manual therapy   PLAN FOR NEXT SESSION: hip strength, functional  strength, balance training   3:24 PM, 06/14/21 Amy Small Lynch MPT [facility] [place] #[phone]

## 2021-06-26 ENCOUNTER — Encounter (HOSPITAL_COMMUNITY): Payer: Medicare HMO | Admitting: Physical Therapy

## 2021-06-29 ENCOUNTER — Encounter (HOSPITAL_COMMUNITY): Payer: Self-pay | Admitting: Physical Therapy

## 2021-06-29 ENCOUNTER — Ambulatory Visit (HOSPITAL_COMMUNITY): Payer: Medicare HMO | Attending: Physician Assistant | Admitting: Physical Therapy

## 2021-06-29 DIAGNOSIS — R2689 Other abnormalities of gait and mobility: Secondary | ICD-10-CM | POA: Diagnosis not present

## 2021-06-29 DIAGNOSIS — M6281 Muscle weakness (generalized): Secondary | ICD-10-CM | POA: Diagnosis not present

## 2021-06-29 DIAGNOSIS — M7061 Trochanteric bursitis, right hip: Secondary | ICD-10-CM | POA: Diagnosis not present

## 2021-06-29 DIAGNOSIS — R29898 Other symptoms and signs involving the musculoskeletal system: Secondary | ICD-10-CM | POA: Diagnosis not present

## 2021-06-29 DIAGNOSIS — M25551 Pain in right hip: Secondary | ICD-10-CM | POA: Insufficient documentation

## 2021-06-29 NOTE — Therapy (Signed)
OUTPATIENT PHYSICAL THERAPY LOWER EXTREMITY EVALUATION   Patient Name: Lauren Knox MRN: [id number] DOB:04-Jun-1945, 76 y.o., female Today's Date: 06/29/2021   PT End of Session - 06/29/21 1410     Visit Number 4    Number of Visits 12    Date for PT Re-Evaluation 07/20/21    Authorization Type [employer] Medicare  (no auth , no vl)    Progress Note Due on Visit 10    PT Start Time 1410    PT Stop Time 1448    PT Time Calculation (min) 38 min    Activity Tolerance Patient tolerated treatment well    Behavior During Therapy WFL for tasks assessed/performed              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   Osteoarthritis of hands, 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   Osteopenia    Raynaud's disease    Raynaud'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 01/27/2016   Rosacea 01/27/2016   Varicose veins    Vitamin D deficiency 01/27/2016   Past Surgical History:  Procedure Laterality Date   ABDOMINAL HYSTERECTOMY     CESAREAN SECTION     CHOLECYSTECTOMY     INTESTINAL BLOCKAGE     TONSILLECTOMY     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   Muscle weakness (generalized) 09/17/2013   Scoliosis concern 09/17/2013   Stiffness of joint, not elsewhere classified, pelvic region and thigh 09/17/2013   Difficulty in walking(719.7) 09/17/2013    PCP: Asencion Noble MD  REFERRING PROVIDER: Ofilia Neas, PA-C   REFERRING DIAG: M70.61 (ICD-10-CM) - Trochanteric bursitis of right hip   THERAPY DIAG:  Pain in right hip  Muscle weakness (generalized)  Other  abnormalities of gait and mobility  Other symptoms and signs involving the musculoskeletal system  Rationale for Evaluation and Treatment Rehabilitation  ONSET DATE: April 2023  SUBJECTIVE:   SUBJECTIVE STATEMENT: She had a back spasm in her back when going to the beach. She hasn't been able to do a lot of moving. It took about a week and using a heating pad to decrease symptoms. Symptoms increase with being sedentary. Hip is back to stage 1 because she wasn't able to do a whole lot. Walking helps with stiffness. Symptoms with moving.  PERTINENT HISTORY: history of osteoarthritis, degenerative disc disease and osteoporosis   PAIN:  Are you having pain? Yes: NPRS scale: 0/10 rest,  activity 8/10 Pain location: R hip Pain description: stretch, hurts Aggravating factors: walking/stairs Relieving factors: rest  PRECAUTIONS: None  WEIGHT BEARING RESTRICTIONS No  FALLS:  Has patient fallen in last 6 months? No  LIVING ENVIRONMENT: Lives with: lives with their spouse Lives in: House/apartment Stairs: Yes: External: 1 steps; on right going up and on left going up Has following equipment at home: None  OCCUPATION: Retired  PLOF: Independent  PATIENT GOALS Hip to feel better   OBJECTIVE:    PATIENT SURVEYS:  FOTO 44%function  COGNITION:  Overall cognitive status: Within functional limits for tasks assessed     SENSATION: WFL    POSTURE: rounded shoulders and forward head  PALPATION: TTP R glute med/min, slight glute max on R, no tenderness throughout rest of R hip or L   LOWER EXTREMITY ROM:  Active ROM Right eval Left eval  Hip flexion    Hip extension    Hip abduction    Hip adduction    Hip internal rotation    Hip external rotation    Knee flexion    Knee extension    Ankle dorsiflexion    Ankle plantarflexion    Ankle inversion    Ankle eversion     (Blank rows = not tested)  LOWER EXTREMITY MMT:  MMT Right eval Left eval  Hip flexion  4/5 4/5  Hip extension 3+/5 4-/5  Hip abduction 3+/5 3+/5  Hip adduction    Hip internal rotation    Hip external rotation    Knee flexion 4+/5 5/5  Knee extension 4+/5 5/5  Ankle dorsiflexion 5/5 5/5  Ankle plantarflexion    Ankle inversion    Ankle eversion     (Blank rows = not tested)    FUNCTIONAL TESTS:  5 times sit to stand: 9.29 seconds without UE support, relies on LLE >R with slight weight shift 2 minute walk test: 410 feet  GAIT: Distance walked: 410 feet Assistive device utilized: None Level of assistance: Complete Independence Comments: 2MWT, Trendelenburg on R, c/o "pulling" in R hip    TODAY'S TREATMENT: 06/29/21 Hamstring stretch 10 seconds x5 performed B/L Figure 4 stretch  10 seconds x5 performed B/L Bridge 2 x 10 Hip extension 2x 10 bilateral Step up 4 inch 2x 10 RLE   06/14/21 Hamstring stretch 10 seconds x5 performed B/L Figure 4 stretch  10 seconds x5 performed B/L Supine hip flexor/quad stretch  10 seconds x5 performed B/L Prone hip extensions x10 b/l Quadruped fire hydrants x10 b/l  1/2 kneeling with right LE as load bearing leg x1 min hold  Lateral and forward facing eccentric step downs off short stairs with ue support x10 each performed B/L Stair negotiation with slowed control to improve stability 6 steps x2  Side steps in ll bars x2 laps All at SBA  06/09/21 Bridge 2 x 10 Bridge with ball squeeze for hip Adduction x 10 Bridge with belt for hip Abduction x 10 Sidelying clam 2 x 10  06/08/21 Clam 2x 10 Bridge 2x 10    PATIENT EDUCATION:  Education details: updated HEP 06/14/21 Person educated: Patient Education method: Explanation, Demonstration, and Handouts Education comprehension: verbalized understanding, returned demonstration, verbal cues required, and tactile cues required  HOME EXERCISE PROGRAM:  06/14/21  Access Code: [email or web address] URL: [email or web address] Date: 06/14/2021 Prepared by: Leota Jacobsen  Exercises - Supine Hamstring Stretch with Strap  - 1 x daily - 7 x weekly - 1 sets - 5 reps - 10 hold - Supine Figure 4 Piriformis Stretch  - 1 x daily - 7 x weekly - 1 sets - 5 reps - 10 hold - Supine Quadriceps Stretch with Strap on Table  - 1 x daily - 7 x weekly - 1 sets - 5 reps - 10 hold  Access Code: [email or web address] URL: [email or web address] Date: 06/09/2021 Prepared by: [facility] - Rehab  Exercises - Supine Bridge with Mini Swiss Ball Between Knees  - 2-3 x daily - 7 x weekly - 1 sets - 10 reps - Bridge with Hip Abduction and Resistance  - 2-3 x daily - 7 x weekly - 1 sets - 10 reps - Heel Raises with Counter Support  - 2-3 x  daily - 7 x weekly - 1 sets - 10 reps - Standing Hip Extension with Counter Support  - 2-3 x daily - 7 x weekly - 1 sets - 10 reps 5/25/23Access Code: [email or web address] - Supine Bridge  - 1 x daily - 7 x weekly - 2 sets - 10 reps - Clamshell (Mirrored)  - 1 x daily - 7 x weekly - 2 sets - 10 reps  ASSESSMENT:  CLINICAL IMPRESSION: Patient returns to clinic after several weeks out of town. Began with previously completed stretches as those have been helpful for decreasing symptoms in prior sessions but she has not been completing due to back pain. Continued with glute strengthening after mobility exercises. Patient will benefit from skilled physical therapy in order to improve function and reduce impairment.    OBJECTIVE IMPAIRMENTS Abnormal gait, decreased activity tolerance, decreased balance, decreased endurance, decreased mobility, difficulty walking, decreased ROM, decreased strength, improper body mechanics, and pain.   ACTIVITY LIMITATIONS carrying, lifting, bending, standing, squatting, stairs, transfers, bed mobility, and locomotion level  PARTICIPATION LIMITATIONS: meal prep, cleaning, laundry, shopping, community activity, and yard work  PERSONAL FACTORS Age, Fitness, Time since onset of injury/illness/exacerbation, and 3+ comorbidities:  Arthritis, Back pain, Depression, High Blood Pressure, Osteoporosis  are also affecting patient's functional outcome.   REHAB POTENTIAL: Good  CLINICAL DECISION MAKING: Stable/uncomplicated  EVALUATION COMPLEXITY: Low   GOALS: Goals reviewed with patient? Yes  SHORT TERM GOALS: Target date: 06/29/2021  Patient will be independent with HEP in order to improve functional outcomes. Baseline:  Goal status: met 2.  Patient will report at least 25% improvement in symptoms for improved quality of life. Baseline:  Goal status: ongoing  3.  Patient will be able to transfer to standing without UE support with equal weight distrubution.  Baseline: slight L>R Goal status: ongoing   LONG TERM GOALS: Target date: 07/20/2021  Patient will report at least 75% improvement in symptoms for improved quality of life. Baseline:  Goal status: ongoing  2.  Patient will improve FOTO score by at least 15 points in order to indicate improved tolerance to activity. Baseline: 44% function Goal status: ongoing  3.  Patient will be able to navigate stairs with reciprocal pattern without compensation in order to demonstrate improved LE strength. Baseline: see above Goal status: ongoing  4.  Patient will be able to ambulate at least 450 feet in 2MWT without deviation  in order to demonstrate improved tolerance to activity. Baseline: 410 feet with Trendelenburg  Goal status: ongoing  5.   Patient will demonstrate grade of 5/5 MMT grade in all tested musculature as evidence of improved strength to assist with stair ambulation and gait.  Baseline: see MMT Goal status: ongoing    PLAN: PT FREQUENCY: 2x/week  PT DURATION: 6 weeks  PLANNED INTERVENTIONS: Therapeutic exercises, Therapeutic activity, Neuromuscular re-education, Balance training, Gait training, Patient/Family education, Joint manipulation, Joint mobilization, Stair training, Orthotic/Fit training, DME instructions, Aquatic Therapy, Dry  Needling, Electrical stimulation, Spinal manipulation, Spinal mobilization, Cryotherapy, Moist heat, Compression bandaging, scar mobilization, Splintting, Taping, Traction, Ultrasound, Ionotophoresis 55m/ml Dexamethasone, and Manual therapy   PLAN FOR NEXT SESSION: hip strength, functional strength, balance training  2:11 PM, 06/29/21 AMearl LatinPT, DPT [facility]

## 2021-07-05 ENCOUNTER — Ambulatory Visit (HOSPITAL_COMMUNITY): Payer: Medicare HMO | Attending: Internal Medicine | Admitting: Physical Therapy

## 2021-07-05 ENCOUNTER — Encounter (HOSPITAL_COMMUNITY): Payer: Self-pay | Admitting: Physical Therapy

## 2021-07-05 DIAGNOSIS — M6281 Muscle weakness (generalized): Secondary | ICD-10-CM | POA: Diagnosis present

## 2021-07-05 DIAGNOSIS — M25551 Pain in right hip: Secondary | ICD-10-CM | POA: Diagnosis present

## 2021-07-05 NOTE — Therapy (Signed)
OUTPATIENT PHYSICAL THERAPY LOWER EXTREMITY EVALUATION   Patient Name: Lauren Knox MRN: [id number] DOB:07-08-1945, 76 y.o., female Today's Date: 07/05/2021   PT End of Session - 07/05/21 1520     Visit Number 5    Number of Visits 12    Date for PT Re-Evaluation 07/20/21    Authorization Type [employer]  (no auth , no vl)    Progress Note Due on Visit 10    PT Start Time 1520    PT Stop Time 1558    PT Time Calculation (min) 38 min    Activity Tolerance Patient tolerated treatment well    Behavior During Therapy WFL for tasks assessed/performed              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   Osteoarthritis of hands, 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   Osteopenia    Raynaud's disease    Raynaud'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 01/27/2016   Rosacea 01/27/2016   Varicose veins    Vitamin D deficiency 01/27/2016   Past Surgical History:  Procedure Laterality Date   ABDOMINAL HYSTERECTOMY     CESAREAN SECTION     CHOLECYSTECTOMY     INTESTINAL BLOCKAGE     TONSILLECTOMY     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 She thinks she may walking differently which she believes may be contributing to symptoms.   PERTINENT HISTORY: history of osteoarthritis, degenerative disc disease and osteoporosis   PAIN:  Are you having pain? Yes: NPRS scale: 6/10 Pain location: R hip Pain description: stretch, hurts Aggravating factors: walking/stairs Relieving factors: rest  PRECAUTIONS: None  WEIGHT BEARING RESTRICTIONS No  FALLS:  Has patient fallen in last 6 months? No  LIVING ENVIRONMENT: Lives with: lives with their spouse Lives in: House/apartment Stairs: Yes: External: 1 steps; on right going up and on left going up Has following equipment at home: None  OCCUPATION: Retired  PLOF: Independent  PATIENT GOALS Hip to feel better   OBJECTIVE:    PATIENT SURVEYS:  FOTO 44%function  COGNITION:  Overall cognitive status: Within functional limits for tasks assessed     SENSATION: WFL    POSTURE: rounded shoulders and forward head  PALPATION: TTP R glute med/min, slight glute max on R, no tenderness throughout rest of R hip or L   LOWER EXTREMITY ROM:  Active ROM Right eval Left eval  Hip flexion    Hip extension    Hip abduction    Hip adduction    Hip internal rotation  Hip external rotation    Knee flexion    Knee extension    Ankle dorsiflexion    Ankle plantarflexion    Ankle inversion    Ankle eversion     (Blank rows = not tested)  LOWER EXTREMITY MMT:  MMT Right eval Left eval  Hip flexion 4/5 4/5  Hip extension 3+/5 4-/5  Hip abduction 3+/5 3+/5  Hip adduction    Hip internal rotation    Hip external rotation    Knee flexion 4+/5 5/5  Knee extension 4+/5 5/5  Ankle dorsiflexion 5/5 5/5  Ankle plantarflexion    Ankle inversion    Ankle eversion     (Blank rows = not  tested)    FUNCTIONAL TESTS:  5 times sit to stand: 9.29 seconds without UE support, relies on LLE >R with slight weight shift 2 minute walk test: 410 feet  GAIT: Distance walked: 410 feet Assistive device utilized: None Level of assistance: Complete Independence Comments: 2MWT, Trendelenburg on R, c/o "pulling" in R hip    TODAY'S TREATMENT: 07/05/21 Supine: Bridge 2 x 10  Figure 4 stretch 5 x 10" bilateral  Hamstring stretch 5 x10"  Sidelying clamshell x20   Standing: Heel raise x20 Minisquat 2 x10 Standing hip abduction 2 x 10    06/29/21 Hamstring stretch 10 seconds x5 performed B/L Figure 4 stretch  10 seconds x5 performed B/L Bridge 2 x 10 Hip extension 2x 10 bilateral Step up 4 inch 2x 10 RLE   06/14/21 Hamstring stretch 10 seconds x5 performed B/L Figure 4 stretch  10 seconds x5 performed B/L Supine hip flexor/quad stretch  10 seconds x5 performed B/L Prone hip extensions x10 b/l Quadruped fire hydrants x10 b/l  1/2 kneeling with right LE as load bearing leg x1 min hold  Lateral and forward facing eccentric step downs off short stairs with ue support x10 each performed B/L Stair negotiation with slowed control to improve stability 6 steps x2  Side steps in ll bars x2 laps All at SBA  06/09/21 Bridge 2 x 10 Bridge with ball squeeze for hip Adduction x 10 Bridge with belt for hip Abduction x 10 Sidelying clam 2 x 10  06/08/21 Clam 2x 10 Bridge 2x 10    PATIENT EDUCATION:  Education details: updated HEP 06/14/21 Person educated: Patient Education method: Explanation, Demonstration, and Handouts Education comprehension: verbalized understanding, returned demonstration, verbal cues required, and tactile cues required  HOME EXERCISE PROGRAM:  06/14/21  Access Code: [email or web address] URL: [email or web address] Date: 06/14/2021 Prepared by: Leota Jacobsen  Exercises - Supine Hamstring Stretch with Strap  - 1 x daily - 7 x weekly - 1 sets - 5  reps - 10 hold - Supine Figure 4 Piriformis Stretch  - 1 x daily - 7 x weekly - 1 sets - 5 reps - 10 hold - Supine Quadriceps Stretch with Strap on Table  - 1 x daily - 7 x weekly - 1 sets - 5 reps - 10 hold  Access Code: [email or web address] URL: [email or web address] Date: 06/09/2021 Prepared by: AP - Rehab  Exercises - Supine Bridge with Mini Swiss Ball Between Knees  - 2-3 x daily - 7 x weekly - 1 sets - 10 reps - Bridge with Hip Abduction and Resistance  - 2-3 x daily - 7 x weekly - 1 sets - 10 reps - Heel Raises with Counter Support  - 2-3 x daily - 7 x weekly - 1 sets - 10 reps - Standing Hip Extension with Counter  Support  - 2-3 x daily - 7 x weekly - 1 sets - 10 reps 5/25/23Access Code: [email or web address] - Supine Bridge  - 1 x daily - 7 x weekly - 2 sets - 10 reps - Clamshell (Mirrored)  - 1 x daily - 7 x weekly - 2 sets - 10 reps  ASSESSMENT:  CLINICAL IMPRESSION: Patient tolerated session well. Continued with established POC for LE strengthening. Patient cued on proper body mechanics and hold times with stretching for maximized benefits. Added mini squats foe LE strength and glue activation. Patient cued on form. Patient will continue to benefit from skilled therapy services to reduce remaining deficits and improve functional ability.   OBJECTIVE IMPAIRMENTS Abnormal gait, decreased activity tolerance, decreased balance, decreased endurance, decreased mobility, difficulty walking, decreased ROM, decreased strength, improper body mechanics, and pain.   ACTIVITY LIMITATIONS carrying, lifting, bending, standing, squatting, stairs, transfers, bed mobility, and locomotion level  PARTICIPATION LIMITATIONS: meal prep, cleaning, laundry, shopping, community activity, and yard work  PERSONAL FACTORS Age, Fitness, Time since onset of injury/illness/exacerbation, and 3+ comorbidities: Arthritis, Back pain, Depression, High Blood Pressure, Osteoporosis  are also affecting patient's functional  outcome.   REHAB POTENTIAL: Good  CLINICAL DECISION MAKING: Stable/uncomplicated  EVALUATION COMPLEXITY: Low   GOALS: Goals reviewed with patient? Yes  SHORT TERM GOALS: Target date: 06/29/2021  Patient will be independent with HEP in order to improve functional outcomes. Baseline:  Goal status: met 2.  Patient will report at least 25% improvement in symptoms for improved quality of life. Baseline:  Goal status: ongoing  3.  Patient will be able to transfer to standing without UE support with equal weight distrubution.  Baseline: slight L>R Goal status: ongoing   LONG TERM GOALS: Target date: 07/20/2021  Patient will report at least 75% improvement in symptoms for improved quality of life. Baseline:  Goal status: ongoing  2.  Patient will improve FOTO score by at least 15 points in order to indicate improved tolerance to activity. Baseline: 44% function Goal status: ongoing  3.  Patient will be able to navigate stairs with reciprocal pattern without compensation in order to demonstrate improved LE strength. Baseline: see above Goal status: ongoing  4.  Patient will be able to ambulate at least 450 feet in 2MWT without deviation  in order to demonstrate improved tolerance to activity. Baseline: 410 feet with Trendelenburg  Goal status: ongoing  5.   Patient will demonstrate grade of 5/5 MMT grade in all tested musculature as evidence of improved strength to assist with stair ambulation and gait.  Baseline: see MMT Goal status: ongoing    PLAN: PT FREQUENCY: 2x/week  PT DURATION: 6 weeks  PLANNED INTERVENTIONS: Therapeutic exercises, Therapeutic activity, Neuromuscular re-education, Balance training, Gait training, Patient/Family education, Joint manipulation, Joint mobilization, Stair training, Orthotic/Fit training, DME instructions, Aquatic Therapy, Dry Needling, Electrical stimulation, Spinal manipulation, Spinal mobilization, Cryotherapy, Moist heat,  Compression bandaging, scar mobilization, Splintting, Taping, Traction, Ultrasound, Ionotophoresis 65m/ml Dexamethasone, and Manual therapy   PLAN FOR NEXT SESSION: hip strength, functional strength, balance training  3:49 PM, 07/05/21 CJosue HectorPT DPT  [facility] ([phone]

## 2021-07-07 ENCOUNTER — Ambulatory Visit (HOSPITAL_COMMUNITY): Payer: Medicare HMO | Attending: Physician Assistant

## 2021-07-07 ENCOUNTER — Encounter (HOSPITAL_COMMUNITY): Payer: Self-pay

## 2021-07-07 DIAGNOSIS — R2689 Other abnormalities of gait and mobility: Secondary | ICD-10-CM | POA: Diagnosis present

## 2021-07-07 DIAGNOSIS — R29898 Other symptoms and signs involving the musculoskeletal system: Secondary | ICD-10-CM | POA: Diagnosis present

## 2021-07-07 DIAGNOSIS — M545 Low back pain, unspecified: Secondary | ICD-10-CM

## 2021-07-07 DIAGNOSIS — M6281 Muscle weakness (generalized): Secondary | ICD-10-CM

## 2021-07-07 DIAGNOSIS — M25551 Pain in right hip: Secondary | ICD-10-CM

## 2021-07-14 ENCOUNTER — Encounter (HOSPITAL_COMMUNITY): Payer: Self-pay

## 2021-07-14 ENCOUNTER — Ambulatory Visit (HOSPITAL_COMMUNITY): Payer: Medicare HMO

## 2021-07-14 DIAGNOSIS — M545 Low back pain, unspecified: Secondary | ICD-10-CM

## 2021-07-14 DIAGNOSIS — M25551 Pain in right hip: Secondary | ICD-10-CM | POA: Diagnosis not present

## 2021-07-14 DIAGNOSIS — R29898 Other symptoms and signs involving the musculoskeletal system: Secondary | ICD-10-CM

## 2021-07-14 DIAGNOSIS — M6281 Muscle weakness (generalized): Secondary | ICD-10-CM

## 2021-07-14 DIAGNOSIS — R2689 Other abnormalities of gait and mobility: Secondary | ICD-10-CM

## 2021-07-14 NOTE — Therapy (Signed)
OUTPATIENT PHYSICAL THERAPY LOWER EXTREMITY EVALUATION   Patient Name: Lauren Knox MRN: [id number] DOB:12-29-1945, 76 y.o., female Today's Date: 07/14/2021   PT End of Session - 07/14/21 1311     Visit Number 7    Number of Visits 12    Date for PT Re-Evaluation 07/20/21    Authorization Type [employer] Medicare  (no auth , no vl)    Progress Note Due on Visit 10    PT Start Time 1311    PT Stop Time 1345    PT Time Calculation (min) 34 min    Activity Tolerance Patient tolerated treatment well    Behavior During Therapy [facility] for tasks assessed/performed              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   Osteoarthritis of hands, 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   Osteopenia    Raynaud's disease    Raynaud'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 01/27/2016   Rosacea 01/27/2016   Varicose veins    Vitamin D deficiency 01/27/2016   Past Surgical History:  Procedure Laterality Date   ABDOMINAL HYSTERECTOMY     CESAREAN SECTION     CHOLECYSTECTOMY     INTESTINAL BLOCKAGE     TONSILLECTOMY     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   Muscle weakness (generalized) 09/17/2013   Scoliosis concern 09/17/2013   Stiffness of joint, not elsewhere classified, pelvic region and thigh 09/17/2013   Difficulty in walking(719.7) 09/17/2013    PCP: Asencion Noble MD  REFERRING PROVIDER: Ofilia Neas, PA-C   REFERRING DIAG: M70.61 (ICD-10-CM) - Trochanteric bursitis of right hip   THERAPY DIAG:  Pain in right hip  Muscle weakness (generalized)  Other  abnormalities of gait and mobility  Other symptoms and signs involving the musculoskeletal system  Low back pain, unspecified back pain laterality, unspecified chronicity, unspecified whether sciatica present  Rationale for Evaluation and Treatment Rehabilitation  ONSET DATE: April 2023  SUBJECTIVE:   SUBJECTIVE STATEMENT: patient reports that when she gets up is when she feels hip most. Once she gets moving it feels better but after awhile of walking pain kicks in again. Pt reports that she is in 7/10 pain.   PERTINENT HISTORY: history of osteoarthritis, degenerative disc disease and osteoporosis   PAIN:  Are you having pain? Yes: NPRS scale: 7/10 Pain location: R hip Pain description: stretch, hurts Aggravating factors: walking/stairs Relieving factors: rest  PRECAUTIONS: None  WEIGHT BEARING RESTRICTIONS No  FALLS:  Has patient fallen in last 6 months? No  LIVING ENVIRONMENT: Lives with: lives with their spouse Lives in: House/apartment Stairs: Yes: External: 1 steps; on right going up and on left going up Has following equipment at home: None  OCCUPATION: Retired  PLOF: Independent  PATIENT GOALS Hip to feel better   OBJECTIVE:    PATIENT SURVEYS:  FOTO 44%function  COGNITION:  Overall cognitive status: Within functional limits for tasks assessed     SENSATION: WFL    POSTURE: rounded shoulders and forward head  PALPATION: TTP R glute med/min, slight glute max on R, no tenderness throughout rest  of R hip or L   LOWER EXTREMITY ROM:  Active ROM Right eval Left eval  Hip flexion    Hip extension    Hip abduction    Hip adduction    Hip internal rotation    Hip external rotation    Knee flexion    Knee extension    Ankle dorsiflexion    Ankle plantarflexion    Ankle inversion    Ankle eversion     (Blank rows = not tested)  LOWER EXTREMITY MMT:  MMT Right eval Left eval  Hip flexion 4/5 4/5  Hip extension 3+/5 4-/5  Hip  abduction 3+/5 3+/5  Hip adduction    Hip internal rotation    Hip external rotation    Knee flexion 4+/5 5/5  Knee extension 4+/5 5/5  Ankle dorsiflexion 5/5 5/5  Ankle plantarflexion    Ankle inversion    Ankle eversion     (Blank rows = not tested)    FUNCTIONAL TESTS:  5 times sit to stand: 9.29 seconds without UE support, relies on LLE >R with slight weight shift 2 minute walk test: 410 feet  GAIT: Distance walked: 410 feet Assistive device utilized: None Level of assistance: Complete Independence Comments: 2MWT, Trendelenburg on R, c/o "pulling" in R hip    TODAY'S TREATMENT: 07/14/21 Double leg bridge x20 Hooklying 5 sec iso of ball squeeze and then hip abd into band x20 Prone leg lifts x20 Resisted CC walkouts x3 plates  Seated figure 4 piriformis stretch  x1 min Seated hamstring stretch x 1 min Supine hip flexion and quad stretch x1 mins     07/07/21 Hamstring stretch 15 sec hold x4 reps Figure 4 stretch 15 sec hold x4 reps Sidelying quad and hip flexor stretch with strap 10-15 sec hold x4 reps Sidelying clamshell with yellow tband 3x10 B/l bridge x10  Single leg bridge on right 2x10 Quadruped hip extensions on right 2x10 Prayer stretch x1 min Hip hike standing on right LE off edge of mat    07/05/21 Supine: Bridge 2 x 10  Figure 4 stretch 5 x 10" bilateral  Hamstring stretch 5 x10"  Sidelying clamshell x20   Standing: Heel raise x20 Minisquat 2 x10 Standing hip abduction 2 x 10    06/29/21 Hamstring stretch 10 seconds x5 performed B/L Figure 4 stretch  10 seconds x5 performed B/L Bridge 2 x 10 Hip extension 2x 10 bilateral Step up 4 inch 2x 10 RLE   06/14/21 Hamstring stretch 10 seconds x5 performed B/L Figure 4 stretch  10 seconds x5 performed B/L Supine hip flexor/quad stretch  10 seconds x5 performed B/L Prone hip extensions x10 b/l Quadruped fire hydrants x10 b/l  1/2 kneeling with right LE as load bearing leg x1 min hold   Lateral and forward facing eccentric step downs off short stairs with ue support x10 each performed B/L Stair negotiation with slowed control to improve stability 6 steps x2  Side steps in ll bars x2 laps All at SBA  06/09/21 Bridge 2 x 10 Bridge with ball squeeze for hip Adduction x 10 Bridge with belt for hip Abduction x 10 Sidelying clam 2 x 10  06/08/21 Clam 2x 10 Bridge 2x 10    PATIENT EDUCATION:  Education details: updated HEP 06/14/21 Person educated: Patient Education method: Explanation, Demonstration, and Handouts Education comprehension: verbalized understanding, returned demonstration, verbal cues required, and tactile cues required  HOME EXERCISE PROGRAM:  06/14/21  Access Code: [email or web address] URL: [email or web address] Date: 06/14/2021 Prepared  by: Leota Jacobsen  Exercises - Supine Hamstring Stretch with Strap  - 1 x daily - 7 x weekly - 1 sets - 5 reps - 10 hold - Supine Figure 4 Piriformis Stretch  - 1 x daily - 7 x weekly - 1 sets - 5 reps - 10 hold - Supine Quadriceps Stretch with Strap on Table  - 1 x daily - 7 x weekly - 1 sets - 5 reps - 10 hold  Access Code: [email or web address] URL: [email or web address] Date: 06/09/2021 Prepared by: AP - Rehab  Exercises - Supine Bridge with Mini Swiss Ball Between Knees  - 2-3 x daily - 7 x weekly - 1 sets - 10 reps - Bridge with Hip Abduction and Resistance  - 2-3 x daily - 7 x weekly - 1 sets - 10 reps - Heel Raises with Counter Support  - 2-3 x daily - 7 x weekly - 1 sets - 10 reps - Standing Hip Extension with Counter Support  - 2-3 x daily - 7 x weekly - 1 sets - 10 reps 5/25/23Access Code: [email or web address] - Supine Bridge  - 1 x daily - 7 x weekly - 2 sets - 10 reps - Clamshell (Mirrored)  - 1 x daily - 7 x weekly - 2 sets - 10 reps  ASSESSMENT:  CLINICAL IMPRESSION: Patient reports improvement in hip pain when performing bridges.patient instructed to call for follow up appointment with MD for  7/10/-7/14 week as she has not seen much improvement in hip symptoms in 2 months since pain started. Pt reports no improvement with first cortisone injection and has not had improvement much with PT.  Reassess in 2 visits at [facility] to see if therapy should continue based off objective measures.Patient will continue to benefit from skilled therapy services to reduce remaining deficits and improve functional ability.   OBJECTIVE IMPAIRMENTS Abnormal gait, decreased activity tolerance, decreased balance, decreased endurance, decreased mobility, difficulty walking, decreased ROM, decreased strength, improper body mechanics, and pain.   ACTIVITY LIMITATIONS carrying, lifting, bending, standing, squatting, stairs, transfers, bed mobility, and locomotion level  PARTICIPATION LIMITATIONS: meal prep, cleaning, laundry, shopping, community activity, and yard work  PERSONAL FACTORS Age, Fitness, Time since onset of injury/illness/exacerbation, and 3+ comorbidities: Arthritis, Back pain, Depression, High Blood Pressure, Osteoporosis  are also affecting patient's functional outcome.   REHAB POTENTIAL: Good  CLINICAL DECISION MAKING: Stable/uncomplicated  EVALUATION COMPLEXITY: Low   GOALS: Goals reviewed with patient? Yes  SHORT TERM GOALS: Target date: 06/29/2021  Patient will be independent with HEP in order to improve functional outcomes. Baseline:  Goal status: met 2.  Patient will report at least 25% improvement in symptoms for improved quality of life. Baseline:  Goal status: ongoing  3.  Patient will be able to transfer to standing without UE support with equal weight distrubution.  Baseline: slight L>R Goal status: ongoing   LONG TERM GOALS: Target date: 07/20/2021  Patient will report at least 75% improvement in symptoms for improved quality of life. Baseline:  Goal status: ongoing  2.  Patient will improve FOTO score by at least 15 points in order to indicate improved tolerance to  activity. Baseline: 44% function Goal status: ongoing  3.  Patient will be able to navigate stairs with reciprocal pattern without compensation in order to demonstrate improved LE strength. Baseline: see above Goal status: ongoing  4.  Patient will be able to ambulate at least 450 feet in 2MWT without deviation  in order to demonstrate improved tolerance to activity.  Baseline: 410 feet with Trendelenburg  Goal status: ongoing  5.   Patient will demonstrate grade of 5/5 MMT grade in all tested musculature as evidence of improved strength to assist with stair ambulation and gait.  Baseline: see MMT Goal status: ongoing    PLAN: PT FREQUENCY: 2x/week  PT DURATION: 6 weeks  PLANNED INTERVENTIONS: Therapeutic exercises, Therapeutic activity, Neuromuscular re-education, Balance training, Gait training, Patient/Family education, Joint manipulation, Joint mobilization, Stair training, Orthotic/Fit training, DME instructions, Aquatic Therapy, Dry Needling, Electrical stimulation, Spinal manipulation, Spinal mobilization, Cryotherapy, Moist heat, Compression bandaging, scar mobilization, Splintting, Taping, Traction, Ultrasound, Ionotophoresis 87m/ml Dexamethasone, and Manual therapy   PLAN FOR NEXT SESSION: hip strength, functional strength, balance training  1:12 PM, 07/14/21 Tatelyn Vanhecke PT, DPT

## 2021-07-17 ENCOUNTER — Encounter (HOSPITAL_COMMUNITY): Payer: Self-pay | Admitting: Physical Therapy

## 2021-07-17 ENCOUNTER — Ambulatory Visit (HOSPITAL_COMMUNITY): Payer: Medicare HMO | Attending: Physician Assistant | Admitting: Physical Therapy

## 2021-07-17 DIAGNOSIS — M6281 Muscle weakness (generalized): Secondary | ICD-10-CM | POA: Diagnosis present

## 2021-07-17 DIAGNOSIS — M25551 Pain in right hip: Secondary | ICD-10-CM | POA: Diagnosis present

## 2021-07-17 DIAGNOSIS — R2689 Other abnormalities of gait and mobility: Secondary | ICD-10-CM | POA: Diagnosis present

## 2021-07-17 DIAGNOSIS — R29898 Other symptoms and signs involving the musculoskeletal system: Secondary | ICD-10-CM | POA: Insufficient documentation

## 2021-07-17 NOTE — Therapy (Signed)
OUTPATIENT PHYSICAL THERAPY LOWER EXTREMITY EVALUATION   Patient Name: Lauren Knox MRN: [id number] DOB:[birth year], 76 y.o., female Today's Date: 07/17/2021   PT End of Session - 07/17/21 1040     Visit Number 8    Number of Visits 12    Date for PT Re-Evaluation 07/20/21    Authorization Type [employer]  (no auth , no vl)    Progress Note Due on Visit 10    PT Start Time 1040   arrives late/delayed check in   PT Stop Time 1113    PT Time Calculation (min) 33 min    Activity Tolerance Patient tolerated treatment well    Behavior During Therapy [facility] for tasks assessed/performed              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   Osteoarthritis of hands, 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   Osteopenia    Raynaud's disease    Raynaud'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 01/27/2016   Rosacea 01/27/2016   Varicose veins    Vitamin D deficiency 01/27/2016   Past Surgical History:  Procedure Laterality Date   ABDOMINAL HYSTERECTOMY     CESAREAN SECTION     CHOLECYSTECTOMY     INTESTINAL BLOCKAGE     TONSILLECTOMY     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   Muscle weakness (generalized) 09/17/2013   Scoliosis concern 09/17/2013   Stiffness of joint, not elsewhere classified, pelvic region and thigh 09/17/2013   Difficulty in walking(719.7) 09/17/2013    PCP: Asencion Noble MD  REFERRING PROVIDER: Ofilia Neas, PA-C   REFERRING DIAG: M70.61 (ICD-10-CM) - Trochanteric bursitis of right hip   THERAPY DIAG:  Pain in right hip  Muscle  weakness (generalized)  Other abnormalities of gait and mobility  Other symptoms and signs involving the musculoskeletal system  Rationale for Evaluation and Treatment Rehabilitation  ONSET DATE: April 2023  SUBJECTIVE:   SUBJECTIVE STATEMENT: Patient states her hip is about the same. Feels like hip needs oiled up. Pain with stairs/incline.  PERTINENT HISTORY: history of osteoarthritis, degenerative disc disease and osteoporosis   PAIN:  Are you having pain? Yes: NPRS scale: 7/10 Pain location: R hip Pain description: stretch, hurts Aggravating factors: walking/stairs Relieving factors: rest  PRECAUTIONS: None  WEIGHT BEARING RESTRICTIONS No  FALLS:  Has patient fallen in last 6 months? No  LIVING ENVIRONMENT: Lives with: lives with their spouse Lives in: House/apartment Stairs: Yes: External: 1 steps; on right going up and on left going up Has following equipment at home: None  OCCUPATION: Retired  PLOF: Independent  PATIENT GOALS Hip to feel better   OBJECTIVE:    PATIENT SURVEYS:  FOTO 44%function  COGNITION:  Overall cognitive status: Within functional limits for tasks assessed     SENSATION: WFL    POSTURE: rounded shoulders and forward head  PALPATION: TTP R glute med/min, slight glute max on R, no tenderness throughout rest of R hip or L   LOWER EXTREMITY ROM:  Active ROM Right eval Left eval  Hip flexion    Hip extension    Hip abduction  Hip adduction    Hip internal rotation    Hip external rotation    Knee flexion    Knee extension    Ankle dorsiflexion    Ankle plantarflexion    Ankle inversion    Ankle eversion     (Blank rows = not tested)  LOWER EXTREMITY MMT:  MMT Right eval Left eval  Hip flexion 4/5 4/5  Hip extension 3+/5 4-/5  Hip abduction 3+/5 3+/5  Hip adduction    Hip internal rotation    Hip external rotation    Knee flexion 4+/5 5/5  Knee extension 4+/5 5/5  Ankle dorsiflexion 5/5 5/5  Ankle  plantarflexion    Ankle inversion    Ankle eversion     (Blank rows = not tested)    FUNCTIONAL TESTS:  5 times sit to stand: 9.29 seconds without UE support, relies on LLE >R with slight weight shift 2 minute walk test: 410 feet  GAIT: Distance walked: 410 feet Assistive device utilized: None Level of assistance: Complete Independence Comments: 2MWT, Trendelenburg on R, c/o "pulling" in R hip    TODAY'S TREATMENT: 07/17/21 Sidelying hip abduction 2x 10 RLE Prone hip extension 2 x 10 bilateral  Step up 6 inch 2x 10 RLE Lateral step up 6 inch 2x 10 RLE Squat 2x 10 with finger support    07/14/21 Double leg bridge x20 Hooklying 5 sec iso of ball squeeze and then hip abd into band x20 Prone leg lifts x20 Resisted CC walkouts x3 plates  Seated figure 4 piriformis stretch  x1 min Seated hamstring stretch x 1 min Supine hip flexion and quad stretch x1 mins     07/07/21 Hamstring stretch 15 sec hold x4 reps Figure 4 stretch 15 sec hold x4 reps Sidelying quad and hip flexor stretch with strap 10-15 sec hold x4 reps Sidelying clamshell with yellow tband 3x10 B/l bridge x10  Single leg bridge on right 2x10 Quadruped hip extensions on right 2x10 Prayer stretch x1 min Hip hike standing on right LE off edge of mat    07/05/21 Supine: Bridge 2 x 10  Figure 4 stretch 5 x 10" bilateral  Hamstring stretch 5 x10"  Sidelying clamshell x20   Standing: Heel raise x20 Minisquat 2 x10 Standing hip abduction 2 x 10   PATIENT EDUCATION:  Education details: updated HEP 06/14/21 Person educated: Patient Education method: Explanation, Demonstration, and Handouts Education comprehension: verbalized understanding, returned demonstration, verbal cues required, and tactile cues required  HOME EXERCISE PROGRAM:  06/14/21  Access Code: [email or web address] URL: [email or web address] Date: 06/14/2021 Prepared by: Leota Jacobsen  Exercises - Supine Hamstring Stretch with  Strap  - 1 x daily - 7 x weekly - 1 sets - 5 reps - 10 hold - Supine Figure 4 Piriformis Stretch  - 1 x daily - 7 x weekly - 1 sets - 5 reps - 10 hold - Supine Quadriceps Stretch with Strap on Table  - 1 x daily - 7 x weekly - 1 sets - 5 reps - 10 hold  7/3 - Squat with Counter Support  - 1 x daily - 7 x weekly - 3 sets - 10 reps  Access Code: [email or web address] URL: [email or web address] Date: 06/09/2021 Prepared by: [facility] - Rehab  Exercises - Supine Bridge with Mini Swiss Ball Between Knees  - 2-3 x daily - 7 x weekly - 1 sets - 10 reps - Bridge with Hip Abduction and Resistance  - 2-3 x daily - 7 x weekly - 1 sets -  10 reps - Heel Raises with Counter Support  - 2-3 x daily - 7 x weekly - 1 sets - 10 reps - Standing Hip Extension with Counter Support  - 2-3 x daily - 7 x weekly - 1 sets - 10 reps 5/25/23Access Code: [email or web address] - Supine Bridge  - 1 x daily - 7 x weekly - 2 sets - 10 reps - Clamshell (Mirrored)  - 1 x daily - 7 x weekly - 2 sets - 10 reps  ASSESSMENT:  CLINICAL IMPRESSION: Continued with glute strengthening with table exercises and standing. Patient tolerating increased step height with minimal increase in symptoms with concentric phase. She demonstrates slight dynamic knee valgus on RLE due to glute weakness noted with squat. Patient will continue to benefit from physical therapy in order to improve function and reduce impairment.   OBJECTIVE IMPAIRMENTS Abnormal gait, decreased activity tolerance, decreased balance, decreased endurance, decreased mobility, difficulty walking, decreased ROM, decreased strength, improper body mechanics, and pain.   ACTIVITY LIMITATIONS carrying, lifting, bending, standing, squatting, stairs, transfers, bed mobility, and locomotion level  PARTICIPATION LIMITATIONS: meal prep, cleaning, laundry, shopping, community activity, and yard work  PERSONAL FACTORS Age, Fitness, Time since onset of injury/illness/exacerbation, and 3+  comorbidities: Arthritis, Back pain, Depression, High Blood Pressure, Osteoporosis  are also affecting patient's functional outcome.   REHAB POTENTIAL: Good  CLINICAL DECISION MAKING: Stable/uncomplicated  EVALUATION COMPLEXITY: Low   GOALS: Goals reviewed with patient? Yes  SHORT TERM GOALS: Target date: 06/29/2021  Patient will be independent with HEP in order to improve functional outcomes. Baseline:  Goal status: met 2.  Patient will report at least 25% improvement in symptoms for improved quality of life. Baseline:  Goal status: ongoing  3.  Patient will be able to transfer to standing without UE support with equal weight distrubution.  Baseline: slight L>R Goal status: ongoing   LONG TERM GOALS: Target date: 07/20/2021  Patient will report at least 75% improvement in symptoms for improved quality of life. Baseline:  Goal status: ongoing  2.  Patient will improve FOTO score by at least 15 points in order to indicate improved tolerance to activity. Baseline: 44% function Goal status: ongoing  3.  Patient will be able to navigate stairs with reciprocal pattern without compensation in order to demonstrate improved LE strength. Baseline: see above Goal status: ongoing  4.  Patient will be able to ambulate at least 450 feet in 2MWT without deviation  in order to demonstrate improved tolerance to activity. Baseline: 410 feet with Trendelenburg  Goal status: ongoing  5.   Patient will demonstrate grade of 5/5 MMT grade in all tested musculature as evidence of improved strength to assist with stair ambulation and gait.  Baseline: see MMT Goal status: ongoing    PLAN: PT FREQUENCY: 2x/week  PT DURATION: 6 weeks  PLANNED INTERVENTIONS: Therapeutic exercises, Therapeutic activity, Neuromuscular re-education, Balance training, Gait training, Patient/Family education, Joint manipulation, Joint mobilization, Stair training, Orthotic/Fit training, DME instructions, Aquatic  Therapy, Dry Needling, Electrical stimulation, Spinal manipulation, Spinal mobilization, Cryotherapy, Moist heat, Compression bandaging, scar mobilization, Splintting, Taping, Traction, Ultrasound, Ionotophoresis 42m/ml Dexamethasone, and Manual therapy   PLAN FOR NEXT SESSION: hip strength, functional strength, balance training  11:13 AM, 07/17/21 AMearl LatinPT, DPT [facility]

## 2021-07-19 ENCOUNTER — Telehealth: Payer: Self-pay | Admitting: Rheumatology

## 2021-07-19 NOTE — Telephone Encounter (Signed)
Spoke with patient and schedule patient for 08/03/2021 for an office visit and possible cortisone injection.

## 2021-07-19 NOTE — Telephone Encounter (Signed)
Ok to schedule on or after 08/01/21.

## 2021-07-19 NOTE — Telephone Encounter (Signed)
Attempted to contact patient and left message for patient to call the office.  

## 2021-07-19 NOTE — Telephone Encounter (Signed)
Patient left a voicemail (07/18/21 at 9:45 am) stating she has been attending physical therapy for the bursitis in her hip.  Patient states her physical therapist recommends another cortisone injection.  Patient requested a return call.

## 2021-07-20 ENCOUNTER — Encounter (HOSPITAL_COMMUNITY): Payer: Medicare HMO | Admitting: Physical Therapy

## 2021-07-20 NOTE — Progress Notes (Signed)
Office Visit Note  Patient: Lauren Knox             Date of Birth: [birth year]           MRN: [id number]             PCP: Asencion Noble, MD Referring: Asencion Noble, MD Visit Date: 08/03/2021 Occupation: '@GUAROCC'$ @  Subjective:  Right trochanteric bursitis   History of Present Illness: Lauren Knox is a 76 y.o. female with history of osteoarthritis and DDD.  She presents today with discomfort due to trochanteric bursitis of the right hip. She has been going to PT and performing home exercises.  She had a right trochanteric bursa cortisone injection 05/02/2021 which right temporary relief.  She requested a repeat cortisone injection today since she does not feel that she has been progressing in physical therapy.  She continues to experience intermittent pain and stiffness in her neck and lower back.  She has been taking tizanidine 4 mg at bedtime as needed for muscle spasms.  She would like to return for trapezius trigger point injections next week.   Activities of Daily Living:  Patient reports morning stiffness for 1 hour.   Patient Reports nocturnal pain.  Difficulty dressing/grooming: Reports Difficulty climbing stairs: Reports Difficulty getting out of chair: Reports Difficulty using hands for taps, buttons, cutlery, and/or writing: Reports  Review of Systems  Constitutional:  Positive for fatigue.  HENT:  Positive for mouth dryness. Negative for mouth sores.   Eyes:  Positive for dryness.  Respiratory:  Negative for shortness of breath.   Cardiovascular:  Negative for chest pain and palpitations.  Gastrointestinal:  Positive for diarrhea. Negative for blood in stool and constipation.  Endocrine: Negative for increased urination.  Genitourinary:  Negative for involuntary urination.  Musculoskeletal:  Positive for joint pain, joint pain, joint swelling, myalgias, morning stiffness, muscle tenderness and myalgias. Negative for muscle weakness.  Skin:  Positive for color  change. Negative for rash, hair loss and sensitivity to sunlight.  Allergic/Immunologic: Negative for susceptible to infections.  Neurological:  Negative for dizziness and headaches.  Hematological:  Negative for swollen glands.  Psychiatric/Behavioral:  Negative for depressed mood and sleep disturbance. The patient is not nervous/anxious.     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   Muscle weakness (generalized) 09/17/2013   Scoliosis concern 09/17/2013   Stiffness of joint, not elsewhere classified, pelvic region and thigh 09/17/2013   Difficulty in walking(719.7) 09/17/2013    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   Osteoarthritis of hands, 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   Osteopenia    Raynaud's disease    Raynaud'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 01/27/2016   Rosacea 01/27/2016   Varicose veins    Vitamin D deficiency 01/27/2016    Family History  Problem Relation Age of Onset   Cancer Mother    Cancer Father    Heart attack Father    Breast cancer Sister    Cancer Brother    Past Surgical History:  Procedure Laterality Date   ABDOMINAL HYSTERECTOMY     CESAREAN SECTION  CHOLECYSTECTOMY     COLONOSCOPY  2023   INTESTINAL BLOCKAGE     TONSILLECTOMY     Social History   Social History Narrative   Not on file   Immunization History  Administered Date(s) Administered   PFIZER(Purple Top)SARS-COV-2 Vaccination 02/21/2019, 03/14/2019, 11/19/2019     Objective: Vital Signs: BP 114/67 (BP Location: Left Arm, Patient  Position: Sitting, Cuff Size: Normal)   Pulse (!) 56   Ht '5\' 4"'$  (1.626 m)   Wt 132 lb 12.8 oz (60.2 kg)   BMI 22.80 kg/m    Physical Exam Vitals and nursing note reviewed.  Constitutional:      Appearance: She is well-developed.  HENT:     Head: Normocephalic and atraumatic.  Eyes:     Conjunctiva/sclera: Conjunctivae normal.  Cardiovascular:     Rate and Rhythm: Normal rate and regular rhythm.     Heart sounds: Normal heart sounds.  Pulmonary:     Effort: Pulmonary effort is normal.     Breath sounds: Normal breath sounds.  Abdominal:     General: Bowel sounds are normal.     Palpations: Abdomen is soft.  Musculoskeletal:     Cervical back: Normal range of motion.  Skin:    General: Skin is warm and dry.     Capillary Refill: Capillary refill takes less than 2 seconds.  Neurological:     Mental Status: She is alert and oriented to person, place, and time.  Psychiatric:        Behavior: Behavior normal.      Musculoskeletal Exam: C-spine has limited range of motion with lateral rotation.  Trapezius muscle tension and tenderness bilaterally.  No midline spinal tenderness or SI joint tenderness.  Shoulder joints, elbow joints, wrist joints have good range of motion with no discomfort.  PIP and DIP thickening consistent with osteoarthritis of both hands.  Subluxation of several PIP and DIP joints noted.  Hip joints have good range of motion with no groin pain.  Tenderness over bilateral trochanteric bursa, right > left.  Knee joints have good range of motion with no warmth or effusion.  Limited range of motion of the right ankle joint.  Left ankle joint has good range of motion with no warmth or effusion.  CDAI Exam: CDAI Score: -- Patient Global: --; Provider Global: -- Swollen: --; Tender: -- Joint Exam 08/03/2021   No joint exam has been documented for this visit   There is currently no information documented on the homunculus. Go to the Rheumatology activity and  complete the homunculus joint exam.  Investigation: No additional findings.  Imaging: No results found.  Recent Labs: Lab Results  Component Value Date   WBC 7.7 11/01/2020   HGB 14.7 11/01/2020   PLT 396 11/01/2020   NA 139 11/01/2020   K 4.9 11/01/2020   CL 103 11/01/2020   CO2 28 11/01/2020   GLUCOSE 90 11/01/2020   BUN 25 11/01/2020   CREATININE 0.92 11/01/2020   BILITOT 0.7 11/01/2020   AST 21 11/01/2020   ALT 10 11/01/2020   PROT 6.9 11/01/2020   CALCIUM 10.2 11/01/2020   GFRAA 67 11/27/2019    Speciality Comments: Reclast - 04/30/16, 11/13/19,11/17/20  Procedures:  Large Joint Inj on 08/03/2021 9:56 AM Indications: pain Details: 27 G 1.5 in needle, lateral approach  Arthrogram: No  Medications: 1.5 mL lidocaine 1 %; 40 mg triamcinolone acetonide 40 MG/ML Aspirate: 0 mL Outcome: tolerated well, no immediate complications Procedure, treatment alternatives, risks and benefits  explained, specific risks discussed. Consent was given by the patient. Immediately prior to procedure a time out was called to verify the correct patient, procedure, equipment, support staff and site/side marked as required. Patient was prepped and draped in the usual sterile fashion.     Allergies: Codeine and Lisinopril   Assessment / Plan:     Visit Diagnoses: Primary osteoarthritis of both hands: She has PIP and DIP thickening consistent with osteoarthritis of both hands.  CMC joint prominence noted bilaterally.  Discussed the importance of joint protection and muscle strengthening.  Primary osteoarthritis of both feet: She is not experiencing any discomfort in her feet at this time.  DDD (degenerative disc disease), cervical: C-spine has limited range of motion without rotation.  Trapezius muscle tension and tenderness bilaterally.  No symptoms of radiculopathy.  She previously went to physical therapy which was helpful.  She would like to return next week for trapezius trigger point  injections bilaterally.  Trapezius muscle spasm: She has trapezius muscle tension and tenderness bilaterally.  She has been experiencing muscle spasms intermittently.  She plans on returning next week to have trigger point injections performed.  DDD (degenerative disc disease), thoracic: No midline spinal tenderness at this time.  Postural kyphosis of thoracolumbar region: Unchanged.  No midline spinal tenderness.  DDD (degenerative disc disease), lumbar: She experiences intermittent discomfort in her lower back.  No midline spinal tenderness noted.  No symptoms of radiculopathy.  She previously went to physical therapy and has continued home exercises which have been helpful.  Trochanteric bursitis of right hip - She presents today with discomfort in the right hip due to trochanter bursitis.  Her symptoms are exacerbated by sitting for prolonged periods of time especially on the car.  She has been going to physical therapy and has been completing home exercises as advised.  She had a right trochanteric bursa cortisone injection on 05/02/2021 which provided temporary relief.  She requested a repeat trochanteric bursa cortisone injection today since she does not feel that she has been progressing in physical therapy.  Aftercare was discussed today in detail.  Procedure note was completed above.  She was advised to notify us if her symptoms persist or worsen.   Raynaud'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: Not currently active.  No digital ulcerations or signs of gangrene were noted.  No signs of sclerodactyly noted.  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 IV Reclast: November 13, 2019 and 11/17/2020.  She continues to take a calcium and vitamin D supplement daily.  She will schedule repeat DEXA scan with her GYN in September 2023.  She plans on following up in our office in October to discuss DEXA results.  Other medical conditions are  listed as follows:  Vitamin D deficiency: She is taking calcium and vitamin D supplement daily.  History of rosacea  History of attention deficit disorder  Orders: Orders Placed This Encounter  Procedures   Large Joint Inj   No orders of the defined types were placed in this encounter.     Follow-Up Instructions: Return for Osteoarthritis, DDD.   Ofilia Neas, PA-C  Note - This record has been created using Dragon software.  Chart creation errors have been sought, but may not always  have been located. Such creation errors do not reflect on  the standard of medical care.

## 2021-07-28 ENCOUNTER — Other Ambulatory Visit: Payer: Self-pay | Admitting: Gastroenterology

## 2021-08-01 ENCOUNTER — Ambulatory Visit (HOSPITAL_COMMUNITY): Payer: Medicare HMO | Admitting: Physical Therapy

## 2021-08-01 DIAGNOSIS — M25551 Pain in right hip: Secondary | ICD-10-CM | POA: Diagnosis not present

## 2021-08-01 DIAGNOSIS — R2689 Other abnormalities of gait and mobility: Secondary | ICD-10-CM

## 2021-08-01 DIAGNOSIS — M6281 Muscle weakness (generalized): Secondary | ICD-10-CM

## 2021-08-01 NOTE — Therapy (Signed)
OUTPATIENT PHYSICAL THERAPY TREATMENT Progress Note DISCHARGE Reporting Period 06/08/21 to 08/01/21  See note below for Objective Data and Assessment of Progress/Goals.   PHYSICAL THERAPY DISCHARGE SUMMARY  Visits from Start of Care: 9  Current functional level related to goals / functional outcomes: See below   Remaining deficits: See below   Education / Equipment: See below   Patient agrees to discharge. Patient goals were not met. Patient is being discharged due to did not respond to therapy.      Patient Name: Lauren Knox MRN: [id number] DOB:April 22, 1945, 76 y.o., female Today's Date: 08/01/2021   PT End of Session - 08/01/21 1439     Visit Number 9    Number of Visits 12    Date for PT Re-Evaluation 07/20/21    Authorization Type [employer]  (no auth , no vl)    Progress Note Due on Visit 10    PT Start Time 1914    PT Stop Time 1515    PT Time Calculation (min) 39 min    Activity Tolerance Patient tolerated treatment well    Behavior During Therapy [facility] for tasks assessed/performed              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   Osteoarthritis of hands, 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   Osteopenia    Raynaud's disease    Raynaud'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 01/27/2016   Rosacea 01/27/2016   Varicose veins    Vitamin D deficiency 01/27/2016   Past Surgical History:  Procedure Laterality Date   ABDOMINAL HYSTERECTOMY     CESAREAN SECTION     CHOLECYSTECTOMY     INTESTINAL BLOCKAGE     TONSILLECTOMY     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 01/27/2016   ADD (attention deficit disorder) 01/27/2016   Rosacea 01/27/2016   Vitamin D deficiency 01/27/2016    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   Muscle weakness (generalized) 09/17/2013   Scoliosis concern 09/17/2013   Stiffness of joint, not elsewhere classified, pelvic region and thigh 09/17/2013   Difficulty in walking(719.7) 09/17/2013    PCP: Asencion Noble MD  REFERRING PROVIDER: Ofilia Neas, PA-C   REFERRING DIAG: M70.61 (ICD-10-CM) - Trochanteric bursitis of right hip   THERAPY DIAG:  Pain in right hip  Muscle weakness (generalized)  Other abnormalities of gait and mobility  Rationale for Evaluation and Treatment Rehabilitation  ONSET DATE: April 2023  SUBJECTIVE:   SUBJECTIVE STATEMENT: Pt reports no subjective improvement since starting therapy 8 weeks ago.   States she is having pain in other areas also, her neck, UE from pushing up and her lower back.  States she has not done much activity in the past week as she has had company but does go swimming at [employer] some and does her walking. Pt reports her Rt hip pain is unchanged.  PERTINENT HISTORY: history of osteoarthritis, degenerative disc disease and osteoporosis   PAIN:  Are you having pain? Yes: NPRS scale: 7/10 Pain location: R hip Pain description: stretch, hurts Aggravating factors: walking/stairs Relieving factors: rest  PRECAUTIONS: None  WEIGHT BEARING RESTRICTIONS No  FALLS:  Has patient fallen in last 6 months? No  LIVING ENVIRONMENT: Lives with: lives with their spouse Lives  in: House/apartment Stairs: Yes: External: 1 steps; on right going up and on left going up Has following equipment at home: None  OCCUPATION: Retired  PLOF: Independent  PATIENT GOALS Hip to feel better   OBJECTIVE:    PATIENT SURVEYS:  FOTO 7/18: 59% (was 44%functional status at evaluation)  COGNITION:  Overall cognitive status: Within functional limits for tasks assessed     SENSATION: WFL    POSTURE: rounded shoulders and forward head  PALPATION: TTP R glute med/min, slight glute max on R, no  tenderness throughout rest of R hip or L    LOWER EXTREMITY MMT:  MMT Right eval Right 08/01/21 Left eval Left 08/01/21  Hip flexion 4/5 4/5 4/5 4/5  Hip extension 3+/5 3+/5 4-/5 4-/5  Hip abduction 3+/5 3+/5 3+/5 4+/5  Hip adduction      Hip internal rotation      Hip external rotation      Knee flexion 4+/5 4+/5 5/5   Knee extension 4+/5 4+/5 5/5   Ankle dorsiflexion 5/5  5/5   Ankle plantarflexion      Ankle inversion      Ankle eversion       (Blank rows = not tested)    FUNCTIONAL TESTS:  5 times sit to stand: 7/18:  10.6  (was 9.29 seconds without UE support, relies on LLE >R with slight weight shift) 2 minute walk test: 410 feet  GAIT: Distance walked: 410 feet at evaluation  Assistive device utilized: None Level of assistance: Complete Independence Comments: 2MWT, Trendelenburg on R, c/o "pulling" in R hip      TODAY'S TREATMENT: 08/01/21 Progress note MMT, FOTO, 2MWT, STS test Education on continuation of HEP and returning to pool therapy  07/17/21 Sidelying hip abduction 2x 10 RLE Prone hip extension 2 x 10 bilateral  Step up 6 inch 2x 10 RLE Lateral step up 6 inch 2x 10 RLE Squat 2x 10 with finger support  07/14/21 Double leg bridge x20 Hooklying 5 sec iso of ball squeeze and then hip abd into band x20 Prone leg lifts x20 Resisted CC walkouts x3 plates  Seated figure 4 piriformis stretch  x1 min Seated hamstring stretch x 1 min Supine hip flexion and quad stretch x1 mins  07/07/21 Hamstring stretch 15 sec hold x4 reps Figure 4 stretch 15 sec hold x4 reps Sidelying quad and hip flexor stretch with strap 10-15 sec hold x4 reps Sidelying clamshell with yellow tband 3x10 B/l bridge x10  Single leg bridge on right 2x10 Quadruped hip extensions on right 2x10 Prayer stretch x1 min Hip hike standing on right LE off edge of mat   07/05/21 Supine: Bridge 2 x 10  Figure 4 stretch 5 x 10" bilateral  Hamstring stretch 5 x10"  Sidelying clamshell  x20   Standing: Heel raise x20 Minisquat 2 x10 Standing hip abduction 2 x 10   PATIENT EDUCATION:  Education details: updated HEP 06/14/21 Person educated: Patient Education method: Explanation, Demonstration, and Handouts Education comprehension: verbalized understanding, returned demonstration, verbal cues required, and tactile cues required  HOME EXERCISE PROGRAM:  06/14/21  Access Code: [email or web address] URL: [email or web address] Date: 06/14/2021 Prepared by: Leota Jacobsen  Exercises - Supine Hamstring Stretch with Strap  - 1 x daily - 7 x weekly - 1 sets - 5 reps - 10 hold - Supine Figure 4 Piriformis Stretch  - 1 x daily - 7 x weekly - 1 sets - 5 reps - 10 hold - Supine Quadriceps Stretch with Strap on  Table  - 1 x daily - 7 x weekly - 1 sets - 5 reps - 10 hold  7/3 - Squat with Counter Support  - 1 x daily - 7 x weekly - 3 sets - 10 reps  Access Code: [email or web address] URL: [email or web address] Date: 06/09/2021 Prepared by: AP - Rehab  Exercises - Supine Bridge with Mini Swiss Ball Between Knees  - 2-3 x daily - 7 x weekly - 1 sets - 10 reps - Bridge with Hip Abduction and Resistance  - 2-3 x daily - 7 x weekly - 1 sets - 10 reps - Heel Raises with Counter Support  - 2-3 x daily - 7 x weekly - 1 sets - 10 reps - Standing Hip Extension with Counter Support  - 2-3 x daily - 7 x weekly - 1 sets - 10 reps 5/25/23Access Code: [email or web address] - Supine Bridge  - 1 x daily - 7 x weekly - 2 sets - 10 reps - Clamshell (Mirrored)  - 1 x daily - 7 x weekly - 2 sets - 10 reps  ASSESSMENT:  CLINICAL IMPRESSION: Patient has completed 9 therapy sessions in the last 8 weeks with pt missing the last couple of weeks due to being busy.  Progress note completed today with objective improvements made in ambulation ability, FOTO score and Lt hip abductor strength.  All other measures remain mostly unchanged as does her Rt hip pain.  Pt reports no subjective improvement.  Pt is  returning to [facility] on Thursday for additional shot in Rt hip and hoping this will enable her to increase her activity. Pt then intends on returning to the [facility] for pool exercise and continuing exercises instructed here in therapy.  PT is now able to negotiate stairs reciprocally, however slight discomfort with Rt step up into hip.  Pt is agreeable to discharge at this time from therapy.   Patient has met 2/3 short term goals and 3/5 long term goals with ability to complete HEP and improvement in strength, gait and functional mobility. She remains limited by c/o continued symptoms although has made good progress with objective measures. Patient discharged from PT at this time.  10:53 AM, 08/02/21 Mearl Latin PT, DPT Physical Therapist at [facility]   OBJECTIVE IMPAIRMENTS Abnormal gait, decreased activity tolerance, decreased balance, decreased endurance, decreased mobility, difficulty walking, decreased ROM, decreased strength, improper body mechanics, and pain.   ACTIVITY LIMITATIONS carrying, lifting, bending, standing, squatting, stairs, transfers, bed mobility, and locomotion level  PARTICIPATION LIMITATIONS: meal prep, cleaning, laundry, shopping, community activity, and yard work  PERSONAL FACTORS Age, Fitness, Time since onset of injury/illness/exacerbation, and 3+ comorbidities: Arthritis, Back pain, Depression, High Blood Pressure, Osteoporosis  are also affecting patient's functional outcome.   REHAB POTENTIAL: Good  CLINICAL DECISION MAKING: Stable/uncomplicated  EVALUATION COMPLEXITY: Low   GOALS: Goals reviewed with patient? Yes  SHORT TERM GOALS: Target date: 06/29/2021  Patient will be independent with HEP in order to improve functional outcomes. Baseline:  Goal status: MET  2.  Patient will report at least 25% improvement in symptoms for improved quality of life. Baseline:  Goal status: NOT MET  3.  Patient will be able to transfer to  standing without UE support with equal weight distrubution.  Baseline: slight L>R Goal status: MET   LONG TERM GOALS: Target date: 07/20/2021  Patient will report at least 75% improvement in symptoms for improved quality of life. Baseline:  Goal status: NOT MET  2.  Patient  will improve FOTO score by at least 15 points in order to indicate improved tolerance to activity. Baseline: 44% function Goal status: MET  3.  Patient will be able to navigate stairs with reciprocal pattern without compensation in order to demonstrate improved LE strength. Baseline: see above Goal status: MET but discomfort with Rt hip  4.  Patient will be able to ambulate at least 450 feet in 2MWT without deviation  in order to demonstrate improved tolerance to activity. Baseline: 410 feet with Trendelenburg  7/18: 500 feet with slight antalgia Rt hip Goal status: MET  5.   Patient will demonstrate grade of 5/5 MMT grade in all tested musculature as evidence of improved strength to assist with stair ambulation and gait.  Baseline: see MMT Goal status: NOT MET    PLAN: PT FREQUENCY: 2x/week  PT DURATION: 6 weeks  PLANNED INTERVENTIONS: Therapeutic exercises, Therapeutic activity, Neuromuscular re-education, Balance training, Gait training, Patient/Family education, Joint manipulation, Joint mobilization, Stair training, Orthotic/Fit training, DME instructions, Aquatic Therapy, Dry Needling, Electrical stimulation, Spinal manipulation, Spinal mobilization, Cryotherapy, Moist heat, Compression bandaging, scar mobilization, Splintting, Taping, Traction, Ultrasound, Ionotophoresis 24m/ml Dexamethasone, and Manual therapy   PLAN FOR NEXT SESSION:  Discharge as pt feels she has made minimal improvements and returning to MD   ATeena Irani PTA/CLT [place]: [phone] 3:16 PM, 08/01/21

## 2021-08-03 ENCOUNTER — Encounter: Payer: Self-pay | Admitting: Physician Assistant

## 2021-08-03 ENCOUNTER — Other Ambulatory Visit: Payer: Self-pay | Admitting: *Deleted

## 2021-08-03 ENCOUNTER — Ambulatory Visit: Payer: Medicare HMO | Admitting: Physician Assistant

## 2021-08-03 VITALS — BP 114/67 | HR 56 | Ht 64.0 in | Wt 132.8 lb

## 2021-08-03 DIAGNOSIS — M5134 Other intervertebral disc degeneration, thoracic region: Secondary | ICD-10-CM

## 2021-08-03 DIAGNOSIS — M7061 Trochanteric bursitis, right hip: Secondary | ICD-10-CM

## 2021-08-03 DIAGNOSIS — M19042 Primary osteoarthritis, left hand: Secondary | ICD-10-CM

## 2021-08-03 DIAGNOSIS — M62838 Other muscle spasm: Secondary | ICD-10-CM

## 2021-08-03 DIAGNOSIS — M19071 Primary osteoarthritis, right ankle and foot: Secondary | ICD-10-CM

## 2021-08-03 DIAGNOSIS — Z8659 Personal history of other mental and behavioral disorders: Secondary | ICD-10-CM

## 2021-08-03 DIAGNOSIS — I73 Raynaud'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: Secondary | ICD-10-CM

## 2021-08-03 DIAGNOSIS — M25561 Pain in right knee: Secondary | ICD-10-CM

## 2021-08-03 DIAGNOSIS — E559 Vitamin D deficiency, unspecified: Secondary | ICD-10-CM

## 2021-08-03 DIAGNOSIS — M5136 Other intervertebral disc degeneration, lumbar region: Secondary | ICD-10-CM

## 2021-08-03 DIAGNOSIS — Z872 Personal history of diseases of the skin and subcutaneous tissue: Secondary | ICD-10-CM

## 2021-08-03 DIAGNOSIS — M5030 Other cervical disc degeneration, unspecified cervical region: Secondary | ICD-10-CM | POA: Diagnosis not present

## 2021-08-03 DIAGNOSIS — M4005 Postural kyphosis, thoracolumbar region: Secondary | ICD-10-CM

## 2021-08-03 DIAGNOSIS — M19041 Primary osteoarthritis, right hand: Secondary | ICD-10-CM

## 2021-08-03 DIAGNOSIS — M810 Age-related osteoporosis without current pathological fracture: Secondary | ICD-10-CM

## 2021-08-03 DIAGNOSIS — M19072 Primary osteoarthritis, left ankle and foot: Secondary | ICD-10-CM

## 2021-08-03 MED ORDER — LIDOCAINE HCL 1 % IJ SOLN
1.5000 mL | INTRAMUSCULAR | Status: AC | PRN
  Administered 2021-08-03: 1.5 mL

## 2021-08-03 MED ORDER — TRIAMCINOLONE ACETONIDE 40 MG/ML IJ SUSP
40.0000 mg | INTRAMUSCULAR | Status: AC | PRN
  Administered 2021-08-03: 40 mg via INTRA_ARTICULAR

## 2021-08-07 ENCOUNTER — Telehealth: Payer: Self-pay | Admitting: *Deleted

## 2021-08-07 NOTE — Telephone Encounter (Signed)
Patient contacted the office stating she had an injection on 08/03/2021. She states she was due to have an appointment for her neck on 08/09/2021. Patient has cancelled that appointment as her neck pain has improved. She states she feels like it may have been related to stress. Patient states the injection along with rest helped with her hip pain. Patient states she is still having trouble going up and down stairs. Patient would like to know if she should resume normal activities or if she should continue to rest her hip a little longer. Please advise.

## 2021-08-07 NOTE — Telephone Encounter (Signed)
Patient advised Lauren Knox would recommend to continue to rest for another week-10 days and resume activity slowly.

## 2021-08-07 NOTE — Telephone Encounter (Signed)
I would continue to rest for another week-10 days and resume activity slowly.

## 2021-08-09 ENCOUNTER — Ambulatory Visit: Payer: Medicare HMO | Admitting: Physician Assistant

## 2021-08-23 ENCOUNTER — Encounter: Payer: Self-pay | Admitting: Vascular Surgery

## 2021-08-23 ENCOUNTER — Ambulatory Visit: Payer: Medicare HMO | Admitting: Vascular Surgery

## 2021-08-23 ENCOUNTER — Ambulatory Visit (HOSPITAL_COMMUNITY)
Admission: RE | Admit: 2021-08-23 | Discharge: 2021-08-23 | Disposition: A | Discharge status: Home / Self Care | Payer: Medicare HMO | Source: Ambulatory Visit | Attending: Vascular Surgery | Admitting: Vascular Surgery

## 2021-08-23 VITALS — BP 137/79 | HR 68 | Temp 97.2°F | Resp 14 | Ht 64.0 in | Wt 135.0 lb

## 2021-08-23 DIAGNOSIS — I83811 Varicose veins of right lower extremities with pain: Secondary | ICD-10-CM

## 2021-08-23 DIAGNOSIS — I872 Venous insufficiency (chronic) (peripheral): Secondary | ICD-10-CM | POA: Diagnosis not present

## 2021-08-23 DIAGNOSIS — M25561 Pain in right knee: Secondary | ICD-10-CM

## 2021-08-23 DIAGNOSIS — I8393 Asymptomatic varicose veins of bilateral lower extremities: Secondary | ICD-10-CM

## 2021-08-23 NOTE — Progress Notes (Signed)
ASSESSMENT & PLAN   CHRONIC VENOUS INSUFFICIENCY: This patient has CEAP C2 venous disease.  She has painful varicose veins of the right lower extremity.  She has deep and superficial venous reflux.  We have discussed the importance of daily leg elevation and the proper positioning for this.  We also fitted her for a thigh-high compression stocking with a gradient of 20 to 30 mmHg.  I encouraged her to avoid prolonged sitting and standing.  We discussed importance of exercise specifically walking and water aerobics.  I will plan on seeing her back in 3 months.  If her symptoms progress I think we could consider her for laser ablation of the right great saphenous vein to the proximal calf.  She would also require 10-20 stab phlebectomies.  I will see her back at that time.  REASON FOR CONSULT:    Varicose veins left leg.  The consult is requested by Dr. Asencion Noble.  HPI:   Lauren Knox is a 76 y.o. female who is referred with varicose veins of the left calf.  I have reviewed the records from the referring office.  The patient was seen for an annual physical exam.  She has a history of hypertension which has been well-controlled.  She also has a history of Raynaud's and continues amlodipine.  She also had varicose veins in her right leg and for this reason was sent for vascular consultation.  On my history, the patient has had a long history of varicose veins.  However about 4 months ago she began having more symptoms in the varicose veins in her right leg.  This occurred about the time she got bursitis in her hip.  I am not sure that there is any real relationship however.  She denies any previous history of DVT.  She did have a saphenous vein cutdown on her right leg in 1972 during an emergent pregnancy.  She developed phlebitis in the right great saphenous vein after that.  She has had no recent phlebitis.  She has had no previous venous procedures except for the cutdown.  She does wear  knee-high compression stockings which help her symptoms some.  She does elevate her legs which does help also.  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   Osteoarthritis of hands, 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   Osteopenia    Raynaud's disease    Raynaud'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 01/27/2016   Rosacea 01/27/2016   Varicose veins    Vitamin D deficiency 01/27/2016    Family History  Problem Relation Age of Onset   Cancer Mother    Cancer Father    Heart attack Father    Breast cancer Sister    Cancer Brother     SOCIAL HISTORY: Social History   Tobacco Use   Smoking status: Never    Passive exposure: Past   Smokeless tobacco: Never  Substance Use Topics   Alcohol use: Yes    Alcohol/week: 4.0 standard drinks of alcohol    Types: 4 Glasses of wine per week    Allergies  Allergen Reactions   Codeine Nausea And Vomiting   Lisinopril     Coughing     Current Outpatient Medications  Medication Sig Dispense Refill   amLODipine (NORVASC) 5 MG tablet TK 1 T PO QD  12   calcium carbonate (OS-CAL) 600 MG TABS tablet Take 600 mg by mouth  daily.     cholecalciferol (VITAMIN D) 1000 units tablet Take 1,000 Units by mouth daily.     methocarbamol (ROBAXIN) 500 MG tablet TAKE 1 TABLET(500 MG) BY MOUTH DAILY AS NEEDED FOR MUSCLE SPASMS 30 tablet 0   methylphenidate (RITALIN) 10 MG tablet Take 10-15 mg by mouth daily.     metroNIDAZOLE (METROCREAM) 0.75 % cream APP EXT AA QD  4   olmesartan (BENICAR) 20 MG tablet Take 20 mg by mouth daily.     Probiotic Product (PRO-BIOTIC BLEND PO) Take by mouth daily.      tiZANidine (ZANAFLEX) 4 MG tablet TAKE 1 TABLET(4 MG) BY MOUTH AT BEDTIME (Patient taking differently: at bedtime as needed.) 30 tablet 0   tretinoin (RETIN-A) 0.025 % cream APPLY TO FACE QPM  4   Zoledronic Acid (RECLAST IV) Inject into the vein. Yearly     No current  facility-administered medications for this visit.    REVIEW OF SYSTEMS:  '[X]'$  denotes positive finding, '[ ]'$  denotes negative finding Cardiac  Comments:  Chest pain or chest pressure:    Shortness of breath upon exertion:    Short of breath when lying flat:    Irregular heart rhythm:        Vascular    Pain in calf, thigh, or hip brought on by ambulation: x   Pain in feet at night that wakes you up from your sleep:     Blood clot in your veins:    Leg swelling:  x       Pulmonary    Oxygen at home:    Productive cough:     Wheezing:         Neurologic    Sudden weakness in arms or legs:     Sudden numbness in arms or legs:     Sudden onset of difficulty speaking or slurred speech:    Temporary loss of vision in one eye:     Problems with dizziness:         Gastrointestinal    Blood in stool:     Vomited blood:         Genitourinary    Burning when urinating:     Blood in urine:        Psychiatric    Major depression:         Hematologic    Bleeding problems:    Problems with blood clotting too easily:        Skin    Rashes or ulcers:        Constitutional    Fever or chills:    -  PHYSICAL EXAM:   Vitals:   08/23/21 1356  BP: 137/79  Pulse: 68  Resp: 14  Temp: (!) 97.2 F (36.2 C)  TempSrc: Temporal  SpO2: 100%  Weight: 135 lb (61.2 kg)  Height: '5\' 4"'$  (1.626 m)   Body mass index is 23.17 kg/m. GENERAL: The patient is a well-nourished female, in no acute distress. The vital signs are documented above. CARDIAC: There is a regular rate and rhythm.  VASCULAR: I do not detect carotid bruits. I could not palpate pedal pulses however she had multiphasic signals in both feet. She has some enlarged varicose veins in her medial right calf as documented below.   I did look at her right great saphenous vein myself with the SonoSite and she has reflux down to the proximal calf.  I could likely cannulate the vein in the proximal calf.  It  is significantly  dilated throughout.  The area of superficial thrombophlebitis noted is old and chronic the vein compresses partially.  PULMONARY: There is good air exchange bilaterally without wheezing or rales. ABDOMEN: Soft and non-tender with normal pitched bowel sounds.  MUSCULOSKELETAL: There are no major deformities. NEUROLOGIC: No focal weakness or paresthesias are detected. SKIN: There are no ulcers or rashes noted. PSYCHIATRIC: The patient has a normal affect.  DATA:    VENOUS DUPLEX: I have independently interpreted her venous duplex scan today.  This was of the right lower extremity only.  There is no evidence of DVT.  There is deep venous reflux in the femoral vein.  There is superficial venous reflux in the right great saphenous vein down to the proximal calf.  In the proximal calf there is an area of superficial thrombophlebitis associated with the saphenous vein and an adjacent varicose vein.  Diameters of the vein ranged from 6-9 mm.     Deitra Mayo [facility]

## 2021-09-22 ENCOUNTER — Encounter (HOSPITAL_COMMUNITY): Payer: Self-pay | Admitting: Gastroenterology

## 2021-09-22 NOTE — Progress Notes (Signed)
Attempted to obtain medical history via telephone, unable to reach at this time. HIPAA compliant voicemail message left requesting return call to pre surgical testing department. 

## 2021-09-29 ENCOUNTER — Encounter (HOSPITAL_COMMUNITY)
Admission: RE | Disposition: A | Discharge status: Home / Self Care | Payer: Self-pay | Source: Home / Self Care | Attending: Gastroenterology

## 2021-09-29 ENCOUNTER — Other Ambulatory Visit: Payer: Self-pay

## 2021-09-29 ENCOUNTER — Ambulatory Visit (HOSPITAL_BASED_OUTPATIENT_CLINIC_OR_DEPARTMENT_OTHER): Payer: Medicare HMO | Admitting: Anesthesiology

## 2021-09-29 ENCOUNTER — Ambulatory Visit (HOSPITAL_COMMUNITY): Payer: Medicare HMO | Admitting: Anesthesiology

## 2021-09-29 ENCOUNTER — Encounter (HOSPITAL_COMMUNITY): Payer: Self-pay | Admitting: Gastroenterology

## 2021-09-29 ENCOUNTER — Ambulatory Visit (HOSPITAL_COMMUNITY)
Admission: RE | Admit: 2021-09-29 | Discharge: 2021-09-29 | Disposition: A | Discharge status: Home / Self Care | Payer: Medicare HMO | Attending: Gastroenterology | Admitting: Gastroenterology

## 2021-09-29 DIAGNOSIS — K5520 Angiodysplasia of colon without hemorrhage: Secondary | ICD-10-CM | POA: Diagnosis not present

## 2021-09-29 DIAGNOSIS — I10 Essential (primary) hypertension: Secondary | ICD-10-CM | POA: Insufficient documentation

## 2021-09-29 DIAGNOSIS — Z1211 Encounter for screening for malignant neoplasm of colon: Secondary | ICD-10-CM | POA: Insufficient documentation

## 2021-09-29 DIAGNOSIS — K635 Polyp of colon: Secondary | ICD-10-CM

## 2021-09-29 DIAGNOSIS — F988 Other specified behavioral and emotional disorders with onset usually occurring in childhood and adolescence: Secondary | ICD-10-CM | POA: Diagnosis not present

## 2021-09-29 DIAGNOSIS — K5730 Diverticulosis of large intestine without perforation or abscess without bleeding: Secondary | ICD-10-CM | POA: Insufficient documentation

## 2021-09-29 DIAGNOSIS — Z86010 Personal history of colonic polyps: Secondary | ICD-10-CM | POA: Insufficient documentation

## 2021-09-29 DIAGNOSIS — Z7722 Contact with and (suspected) exposure to environmental tobacco smoke (acute) (chronic): Secondary | ICD-10-CM

## 2021-09-29 DIAGNOSIS — D120 Benign neoplasm of cecum: Secondary | ICD-10-CM | POA: Diagnosis not present

## 2021-09-29 DIAGNOSIS — D123 Benign neoplasm of transverse colon: Secondary | ICD-10-CM | POA: Insufficient documentation

## 2021-09-29 HISTORY — DX: Essential (primary) hypertension: I10

## 2021-09-29 HISTORY — PX: POLYPECTOMY: SHX5525

## 2021-09-29 HISTORY — PX: COLONOSCOPY WITH PROPOFOL: SHX5780

## 2021-09-29 HISTORY — PX: HEMOSTASIS CLIP PLACEMENT: SHX6857

## 2021-09-29 SURGERY — COLONOSCOPY WITH PROPOFOL
Anesthesia: Monitor Anesthesia Care

## 2021-09-29 MED ORDER — LACTATED RINGERS IV SOLN
INTRAVENOUS | Status: DC
  Administered 2021-09-29: 1000 mL via INTRAVENOUS

## 2021-09-29 MED ORDER — PROPOFOL 500 MG/50ML IV EMUL
INTRAVENOUS | Status: DC | PRN
  Administered 2021-09-29: 50 ug/kg/min via INTRAVENOUS
  Administered 2021-09-29: 60 ug/kg/min via INTRAVENOUS
  Administered 2021-09-29 (×2): 20 mg via INTRAVENOUS

## 2021-09-29 MED ORDER — PROPOFOL 1000 MG/100ML IV EMUL
INTRAVENOUS | Status: AC
  Filled 2021-09-29: qty 100

## 2021-09-29 SURGICAL SUPPLY — 22 items

## 2021-09-29 NOTE — Anesthesia Preprocedure Evaluation (Addendum)
Anesthesia Evaluation  Patient identified by MRN, date of birth, ID band Patient awake    Reviewed: Allergy & Precautions, H&P , NPO status , Patient's Chart, lab work & pertinent test results  Airway Mallampati: I  TM Distance: >3 FB Neck ROM: Full    Dental no notable dental hx. (+) Teeth Intact, Dental Advisory Given, Caps   Pulmonary neg pulmonary ROS,    Pulmonary exam normal breath sounds clear to auscultation       Cardiovascular Exercise Tolerance: Good hypertension, Pt. on medications negative cardio ROS Normal cardiovascular exam Rhythm:Regular Rate:Normal     Neuro/Psych PSYCHIATRIC DISORDERS negative neurological ROS  negative psych ROS   GI/Hepatic negative GI ROS, Neg liver ROS,   Endo/Other  negative endocrine ROS  Renal/GU negative Renal ROS  negative genitourinary   Musculoskeletal negative musculoskeletal ROS (+) Arthritis , Osteoarthritis,    Abdominal   Peds negative pediatric ROS (+) ATTENTION DEFICIT DISORDER WITHOUT HYPERACTIVITY Hematology negative hematology ROS (+)   Anesthesia Other Findings   Reproductive/Obstetrics negative OB ROS                            Anesthesia Physical Anesthesia Plan  ASA: 2  Anesthesia Plan: MAC   Post-op Pain Management: Minimal or no pain anticipated   Induction: Intravenous  PONV Risk Score and Plan: 2 and Propofol infusion  Airway Management Planned: Mask and Natural Airway  Additional Equipment: None  Intra-op Plan:   Post-operative Plan:   Informed Consent: I have reviewed the patients History and Physical, chart, labs and discussed the procedure including the risks, benefits and alternatives for the proposed anesthesia with the patient or authorized representative who has indicated his/her understanding and acceptance.     Dental advisory given  Plan Discussed with: Anesthesiologist and CRNA  Anesthesia  Plan Comments:        Anesthesia Quick Evaluation

## 2021-09-29 NOTE — Transfer of Care (Signed)
Immediate Anesthesia Transfer of Care Note  Patient: Lauren Knox  Procedure(s) Performed: COLONOSCOPY WITH PROPOFOL HEMOSTASIS CLIP PLACEMENT POLYPECTOMY  Patient Location: PACU  Anesthesia Type:MAC  Level of Consciousness: awake  Airway & Oxygen Therapy: Patient Spontanous Breathing  Post-op Assessment: Report given to RN  Post vital signs: stable  Last Vitals:  Vitals Value Taken Time  BP 102/55 09/29/21 0950  Temp 36.4 C 09/29/21 0949  Pulse 66 09/29/21 0953  Resp 18 09/29/21 0953  SpO2 99 % 09/29/21 0953  Vitals shown include unvalidated device data.  Last Pain:  Vitals:   09/29/21 0949  TempSrc: Tympanic  PainSc: 3          Complications: No notable events documented.

## 2021-09-29 NOTE — Op Note (Signed)
[facility] Patient Name: Lauren Knox Procedure Date: 09/29/2021 MRN: [id number] Attending MD: Carol Ada , MD Date of Birth: [birth year] CSN: [id number] Age: 76 Admit Type: Outpatient Procedure:                Colonoscopy Indications:              High risk colon cancer surveillance: Personal                            history of colonic polyps Providers:                Carol Ada, MD, Dulcy Fanny, [facility]                            Technician, Technician, Brien Mates, RNFA Referring MD:              Medicines:                Propofol per Anesthesia Complications:            No immediate complications. Estimated Blood Loss:     Estimated blood loss: none. Procedure:                Pre-Anesthesia Assessment:                           - Prior to the procedure, a History and Physical                            was performed, and patient medications and                            allergies were reviewed. The patient's tolerance of                            previous anesthesia was also reviewed. The risks                            and benefits of the procedure and the sedation                            options and risks were discussed with the patient.                            All questions were answered, and informed consent                            was obtained. Prior Anticoagulants: The patient has                            taken no previous anticoagulant or antiplatelet                            agents. ASA Grade Assessment: II - A patient with  mild systemic disease. After reviewing the risks                            and benefits, the patient was deemed in                            satisfactory condition to undergo the procedure.                           - Sedation was administered by an anesthesia                            professional. Deep sedation was attained.                           After obtaining  informed consent, the colonoscope                            was passed under direct vision. Throughout the                            procedure, the patient's blood pressure, pulse, and                            oxygen saturations were monitored continuously. The                            PCF-HQ190L ([id number] Olympus colonoscope was                            introduced through the anus and advanced to the the                            cecum, identified by appendiceal orifice and                            ileocecal valve. The colonoscopy was technically                            difficult and complex. The patient tolerated the                            procedure well. The quality of the bowel                            preparation was evaluated using the BBPS [facility]                            Bowel Preparation Scale) with scores of: Right                            Colon = 3 (entire mucosa seen well with no residual  staining, small fragments of stool or opaque                            liquid), Transverse Colon = 3 (entire mucosa seen                            well with no residual staining, small fragments of                            stool or opaque liquid) and Left Colon = 3 (entire                            mucosa seen well with no residual staining, small                            fragments of stool or opaque liquid). The total                            BBPS score equals 9. The quality of the bowel                            preparation was good. The ileocecal valve,                            appendiceal orifice, and rectum were photographed. Scope In: 9:07:18 AM Scope Out: 9:40:28 AM Scope Withdrawal Time: 0 hours 21 minutes 56 seconds  Total Procedure Duration: 0 hours 33 minutes 10 seconds  Findings:      Seven sessile polyps were found in the transverse colon, ascending colon       and cecum. The polyps were 3 to 20 mm in size. These  polyps were removed       with a cold snare. Resection and retrieval were complete. To close a       defect after polypectomy, three hemostatic clips were successfully       placed. There was no bleeding at the end of the procedure.      A single small localized angiodysplastic lesion without bleeding was       found in the ascending colon.      Scattered small and large-mouthed diverticula were found in the sigmoid       colon.      The sigmoid colon was difficult to traverse with the diverticula. With       frequent scope reductions and tip deflection the area was traversed. In       the cecum the large 2 cm sessile polyp was identified as well as several       other small polyps. The large polyp was successfully removed with the       cold snare and the mucosal defect was closed with three hemoclips. Impression:               - Seven 3 to 20 mm polyps in the transverse colon,                            in the ascending colon and in the cecum, removed  with a cold snare. Resected and retrieved. Clips                            were placed.                           - A single non-bleeding colonic angiodysplastic                            lesion.                           - Diverticulosis in the sigmoid colon. Moderate Sedation:      Not Applicable - Patient had care per Anesthesia. Recommendation:           - Patient has a contact number available for                            emergencies. The signs and symptoms of potential                            delayed complications were discussed with the                            patient. Return to normal activities tomorrow.                            Written discharge instructions were provided to the                            patient.                           - Resume previous diet.                           - Continue present medications.                           - Await pathology results.                            - Repeat colonoscopy in 6-12 months for                            surveillance with Dr. Collene Mares in the office. Procedure Code(s):        --- Professional ---                           [phone], Colonoscopy, flexible; with removal of                            tumor(s), polyp(s), or other lesion(s) by snare                            technique Diagnosis Code(s):        --- Professional ---  K63.5, Polyp of colon                           Z86.010, Personal history of colonic polyps                           K55.20, Angiodysplasia of colon without hemorrhage                           K57.30, Diverticulosis of large intestine without                            perforation or abscess without bleeding CPT copyright 2019 American Medical Association. All rights reserved. The codes documented in this report are preliminary and upon coder review may  be revised to meet current compliance requirements. Carol Ada, MD Carol Ada, MD 09/29/2021 9:57:07 AM This report has been signed electronically. Number of Addenda: 0

## 2021-09-29 NOTE — H&P (Signed)
Lauren Knox HPI: With a recent colonoscopy, by Dr. Collene Knox, at the office, she was found to have a large cecal polyp.  She was scheduled at [facility] for polypectomy of the large polyp.  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 She has been exposed to tobacco smoke. She has never used smokeless tobacco. She reports current alcohol use of about 4.0 standard drinks of alcohol per week. She reports that she does not use drugs.  Allergies:  Allergies  Allergen Reactions   Codeine Nausea And Vomiting   Lisinopril Cough    Medications: Scheduled: Continuous:  lactated ringers      No results found for this or any previous visit (from the past 24 hour(s)).   No results found.  ROS:  As stated above in the HPI otherwise negative.  Height '5\' 3"'$  (1.6 m), weight 60.8 kg.    PE: Gen: NAD, Alert and Oriented HEENT:  [place]/AT, EOMI Neck: Supple, no LAD Lungs: CTA Bilaterally CV: RRR without M/G/R ABD: Soft, NTND, +BS Ext: No C/C/E  Assessment/Plan: 1) Cecal polyp - colonoscopy with polypectomy.  Lauren Knox D 09/29/2021, 8:09 AM

## 2021-09-29 NOTE — Anesthesia Postprocedure Evaluation (Signed)
Anesthesia Post Note  Patient: [name]  Procedure(s) Performed: COLONOSCOPY WITH PROPOFOL HEMOSTASIS CLIP PLACEMENT POLYPECTOMY     Patient location during evaluation: PACU Anesthesia Type: MAC Level of consciousness: awake and alert Pain management: pain level controlled Vital Signs Assessment: post-procedure vital signs reviewed and stable Respiratory status: spontaneous breathing, nonlabored ventilation, respiratory function stable and patient connected to nasal cannula oxygen Cardiovascular status: stable and blood pressure returned to baseline Postop Assessment: no apparent nausea or vomiting Anesthetic complications: no   No notable events documented.  Last Vitals:  Vitals:   09/29/21 0829 09/29/21 0949  BP: (!) 155/80 (!) 102/55  Pulse: 77 70  Resp: 14 15  Temp: 36.6 C (!) 36.4 C  SpO2: 100% 98%    Last Pain:  Vitals:   09/29/21 0949  TempSrc: Tympanic  PainSc: 3                  Keron Neenan

## 2021-09-29 NOTE — Discharge Instructions (Signed)

## 2021-10-01 ENCOUNTER — Encounter (HOSPITAL_COMMUNITY): Payer: Self-pay | Admitting: Gastroenterology

## 2021-10-02 ENCOUNTER — Telehealth: Payer: Self-pay | Admitting: Rheumatology

## 2021-10-02 DIAGNOSIS — M7061 Trochanteric bursitis, right hip: Secondary | ICD-10-CM

## 2021-10-02 LAB — SURGICAL PATHOLOGY

## 2021-10-02 NOTE — Telephone Encounter (Signed)
Please offer a referral to orthopedics for further evaluation.

## 2021-10-02 NOTE — Telephone Encounter (Signed)
Patient called stating she is still experiencing a lot of pain in her hip.  Patient states she has been swimming and doing the exercises she received from PT.  Patient states in July she had a cortisone injection and rested for 10 days which did help, but the pain has returned and seems to be getting worse.  Patient is not sure if she needs another cortisone injection or if there is something else going on and requested a return call.

## 2021-10-02 NOTE — Telephone Encounter (Signed)
Patient states she had an injection in her right hip in July 2023. Patient states she was advised to rest for about 10 days. Patient states the injection helped for about a month. Patient states she has now been having pain that has gradually getting worse. Patient states it has been getting worse for the last 3-4 weeks. Patient states she is doing PT and swimming. Patient states she is having trouble getting up from a sitting position. Patient states it hurts to squat. Patient states she has to pull on something to get up. Patient states walking increases the discomfort in her upper thighs and knees. Please advise.

## 2021-10-03 NOTE — Telephone Encounter (Signed)
Patient offered a referral to orthopedics for further evaluation. Patient is agreeable and referral placed.

## 2021-10-03 NOTE — Addendum Note (Signed)
Addended by: Carole Binning on: 10/03/2021 09:30 AM   Modules accepted: Orders

## 2021-10-06 ENCOUNTER — Ambulatory Visit: Payer: Medicare HMO | Admitting: Sports Medicine

## 2021-10-12 ENCOUNTER — Telehealth: Payer: Self-pay | Admitting: Rheumatology

## 2021-10-12 NOTE — Telephone Encounter (Signed)
Spoke with patient and advised we referred her to Orthopedics. Patient provided with address and advised how to get there.

## 2021-10-12 NOTE — Telephone Encounter (Signed)
Patient called the office stating Lauren Knox had referred her to an Orthopedic doctor, Dr. Rolena Infante, but wasn't sure of the doctors first name. Patient states she wants to make sure she is seeing the correct doctor.

## 2021-10-13 ENCOUNTER — Ambulatory Visit: Payer: Medicare HMO | Admitting: Sports Medicine

## 2021-10-13 ENCOUNTER — Ambulatory Visit (INDEPENDENT_AMBULATORY_CARE_PROVIDER_SITE_OTHER): Payer: Medicare HMO

## 2021-10-13 ENCOUNTER — Ambulatory Visit: Payer: Self-pay

## 2021-10-13 ENCOUNTER — Encounter: Payer: Self-pay | Admitting: Sports Medicine

## 2021-10-13 VITALS — BP 172/82 | HR 67 | Ht 64.0 in | Wt 141.0 lb

## 2021-10-13 DIAGNOSIS — R030 Elevated blood-pressure reading, without diagnosis of hypertension: Secondary | ICD-10-CM

## 2021-10-13 DIAGNOSIS — R29898 Other symptoms and signs involving the musculoskeletal system: Secondary | ICD-10-CM | POA: Diagnosis not present

## 2021-10-13 DIAGNOSIS — M25551 Pain in right hip: Secondary | ICD-10-CM | POA: Diagnosis not present

## 2021-10-13 MED ORDER — BETAMETHASONE SOD PHOS & ACET 6 (3-3) MG/ML IJ SUSP
6.0000 mg | INTRAMUSCULAR | Status: AC | PRN
  Administered 2021-10-13: 6 mg via INTRA_ARTICULAR

## 2021-10-13 MED ORDER — LIDOCAINE HCL 1 % IJ SOLN
2.0000 mL | INTRAMUSCULAR | Status: AC | PRN
  Administered 2021-10-13: 2 mL

## 2021-10-13 MED ORDER — BUPIVACAINE HCL 0.25 % IJ SOLN
2.0000 mL | INTRAMUSCULAR | Status: AC | PRN
  Administered 2021-10-13: 2 mL via INTRA_ARTICULAR

## 2021-10-13 NOTE — Progress Notes (Signed)
Lauren Knox - 76 y.o. female MRN [id number]  Date of birth: [birth year]  Office Visit Note: Visit Date: 10/13/2021 PCP: Asencion Noble, MD Referred by: Ofilia Neas, PA-C  Subjective: Chief Complaint  Patient presents with   Right Hip - Pain   HPI: Lauren Knox is a pleasant 76 y.o. female who presents today for acute on chronic right lateral hip pain.  Pain has been bothering her since the beginning of the summer.  She denies any specific injury.  She has had 2 prior injections in the hip, the last one helped almost 100% for a few weeks.  She has been trying to walk more recently and this exacerbates her pain.  Her pain has been exacerbated recently where she is having difficulty lying on that side of the hip.  She has taken Advil and Tylenol in the past but tries to limit these given the side effects.   Pertinent ROS were reviewed with the patient and found to be negative unless otherwise specified above in HPI.   Assessment & Plan: Visit Diagnoses:  1. Greater trochanteric pain syndrome of right lower extremity   2. Pain in right hip   3. Weakness of right hip   4. Elevated BP without diagnosis of hypertension   *chronic condition with exacerbation *2 or more conditions with injection and Rx management  Plan: Discussed with Ruhani the likely etiology of her hip pain, seems most indicative of greater trochanteric pain syndrome as she does have weakness of her hip abductors, specifically gluteus medius.  Her x-rays show some arthritis about the hip, although provocatively the hip joint is not bothering her.  She had had a recent greater trochanter injection a little over 2 months ago, given the degree of her pain through shared decision making elected to proceed with ultrasound-guided greater trochanteric injection, which she tolerated well.  We did discuss the nature of not repeating these often to preserve the joint and hip space.  Discussed with her that it is imperative to  get equal strength about each hip, she needs to strengthen the right hip abductors in order for this to not become a recurrent issue for her.  We did give her some home exercises and a formal referral to physical therapy.  She may use Tylenol or over-the-counter NSAIDs for any postinjection pain.  She did have elevated blood pressure today, but was asymptomatic.  Given she has no history of this, recommended she follows up with her primary care medical physician for this going forward.  I will see her back in about 5-6 weeks to see how she responds to the injection as well as formalized physical therapy.  If her pain or weakness persist, we may consider obtaining MRI of the hip to evaluate the gluteus medius and minimus tendons as well as surrounding hip structures.  Follow-up: Return in about 6 weeks (around 11/24/2021).   Meds & Orders: No orders of the defined types were placed in this encounter.   Orders Placed This Encounter  Procedures   Large Joint Inj: R greater trochanter   XR HIP UNILAT W OR W/O PELVIS 2-3 VIEWS RIGHT   US Guided Needle Placement - No Linked Charges   Ambulatory referral to Physical Therapy     Procedures: Large Joint Inj: R greater trochanter on 10/13/2021 1:27 PM Indications: pain Details: 22 G 3.5 in needle, ultrasound-guided lateral approach Medications: 2 mL lidocaine 1 %; 2 mL bupivacaine 0.25 %; 6 mg betamethasone acetate-betamethasone sodium  phosphate 6 (3-3) MG/ML  US-Guided Greater Trochanteric Bursa Injection, Right After discussion on risks/benefits/indications and informed verbal consent was obtained, a timeout was performed. The patient was lying in lateral recumbent position on exam table. Using ultrasound guidance, the greater trochanter was identified. The area overlying the trochanteric bursa was then prepped with Betadine and alcohol swabs. Following sterile precautions, ultrasound was reapplied to visualize needle guidance with a 22-gauge 3.5" needle  utilizing an in-plane approach to inject the bursa with 2:2:1 lidocaine:bupivicaine:betamethasone. Delivery of the injectate was visualized into the region of hypoechoic fluid of the greater trochanteric bursa. Patient tolerated procedure well without immediate complications.    Procedure, treatment alternatives, risks and benefits explained, specific risks discussed. Consent was given by the patient. Immediately prior to procedure a time out was called to verify the correct patient, procedure, equipment, support staff and site/side marked as required. Patient was prepped and draped in the usual sterile fashion.          Clinical History: No specialty comments available.  She reports that she has never smoked. She has been exposed to tobacco smoke. She has never used smokeless tobacco. No results for input(s): "HGBA1C", "LABURIC" in the last 8760 hours.  Objective:   Vital Signs: BP (!) 172/82   Pulse 67   Ht '5\' 4"'$  (1.626 m)   Wt 141 lb (64 kg)   BMI 24.20 kg/m   Physical Exam  Gen: Well-appearing, in no acute distress; non-toxic CV: Regular Rate. Well-perfused. Warm.  Resp: Breathing unlabored on room air; no wheezing. Psych: Fluid speech in conversation; appropriate affect; normal thought process Neuro: Sensation intact throughout. No gross coordination deficits.   Ortho Exam - Right hip: There is tenderness to palpation noted over the posterior lateral aspect of the greater trochanter.  No erythema, redness or edema.  Passive logroll equivalent bilaterally with internal and external rotation free of restriction.  There is diminished strength with lateral hip abduction right compared to left with associated pain. + FABER testing, negative FADIR testing.  5/5 strength otherwise other than hip abduction.  Neurovascular intact distally.  Imaging: XR HIP UNILAT W OR W/O PELVIS 2-3 VIEWS RIGHT  Result Date: 10/13/2021 2 views of the right hip including AP and lateral femoral ordered and  reviewed by myself.  Radiographs demonstrate some mild-moderate narrowing about the hip over the inferior aspect of the hip, although certainly not severe DJD.  No acute fracture.  There is some bilateral SI joint sclerosis and bony spurring at the inferior aspect of the SI joint.   Past Medical/Family/Surgical/Social History: Medications & Allergies reviewed per EMR, new medications updated. 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   Muscle weakness (generalized) 09/17/2013   Scoliosis concern 09/17/2013   Stiffness of joint, not elsewhere classified, pelvic region and thigh 09/17/2013   Difficulty in walking(719.7) 09/17/2013   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   Osteopenia    Raynaud's disease    Raynaud'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 01/27/2016   Rosacea 01/27/2016   Varicose veins  Vitamin D deficiency 01/27/2016   Family History  Problem Relation Age of Onset   Cancer Mother    Cancer Father    Heart attack Father    Breast cancer Sister    Cancer Brother    Past Surgical History:  Procedure Laterality Date   ABDOMINAL HYSTERECTOMY     CESAREAN SECTION     CHOLECYSTECTOMY     COLONOSCOPY  2023   COLONOSCOPY WITH PROPOFOL N/A 09/29/2021   Procedure: COLONOSCOPY WITH PROPOFOL;  Surgeon: Carol Ada, MD;  Location: [facility];  Service: Gastroenterology;  Laterality: N/A;   HEMOSTASIS CLIP PLACEMENT  09/29/2021   Procedure: HEMOSTASIS CLIP PLACEMENT;  Surgeon:  Carol Ada, MD;  Location: [facility];  Service: Gastroenterology;;   INTESTINAL BLOCKAGE     POLYPECTOMY  09/29/2021   Procedure: POLYPECTOMY;  Surgeon: Carol Ada, MD;  Location: [facility];  Service: Gastroenterology;;   TONSILLECTOMY     Social History   Occupational History   Not on file  Tobacco Use   Smoking status: Never    Passive exposure: Past   Smokeless tobacco: Never  Vaping Use   Vaping Use: Never used  Substance and Sexual Activity   Alcohol use: Yes    Alcohol/week: 4.0 standard drinks of alcohol    Types: 4 Glasses of wine per week   Drug use: No   Sexual activity: Not on file

## 2021-10-13 NOTE — Progress Notes (Signed)
Pain in right hip since the beginning of summer; no known injury   She believes the pain Is causing other issues throughout her body; neck, elbow, back pain  Denies medication use for the hip Has had 2 cortisone injections into the hip, first one didn't help much Second one helped a lot, but has worn off since  Has had over a month of PT for this hip pain; they discharged her which led to the 2nd cortisone injection

## 2021-10-18 NOTE — Progress Notes (Unsigned)
Office Visit Note  Patient: Lauren Knox             Date of Birth: 06-22-1945           MRN: [id number]             PCP: Asencion Noble, MD Referring: Asencion Noble, MD Visit Date: 11/01/2021 Occupation: '@GUAROCC'$ @  Subjective:  Discuss DEXA results   History of Present Illness: Lauren Knox is a 76 y.o. female with history of osteoarthritis, DDD, and osteoporosis.  Patient presents today to discuss DEXA results from 10/10/2021.  She previously had an intolerance to Fosamax and discontinued after no improvement.  She was then switched to IV Reclast and had 1 infusion in 2018 followed by an infusion on 11/13/2019 and 11/17/2020.  She tolerated Reclast without any side effects but has not had any improvement in her bone density.  She presents today to discuss treatment options. Patient presents today with increased pain in her thoracic spine.  This has been a recurrent issue over the years but states that this episode has lasted for the past 1 month.  She states she was unable to be seen by Dr. Rolena Infante since he is out of town.  She is having muscle spasms in the right trapezius and has been taking muscle relaxers as needed for symptomatic relief.  She is also having a burning sensation next to her right shoulder blade.  She has tried using Bengay, ice, and heat.  She is also been performing stretching exercises on a daily basis but her symptoms have not returned.   Activities of Daily Living:  Patient reports morning stiffness for all day.  Patient Denies nocturnal pain.  Difficulty dressing/grooming: Reports Difficulty climbing stairs: Reports Difficulty getting out of chair: Reports Difficulty using hands for taps, buttons, cutlery, and/or writing: Denies  Review of Systems  Constitutional:  Positive for fatigue.  HENT:  Positive for mouth dryness. Negative for mouth sores.   Eyes:  Positive for dryness.  Respiratory:  Positive for shortness of breath.   Cardiovascular:  Positive  for palpitations. Negative for chest pain.  Gastrointestinal:  Negative for blood in stool, constipation and diarrhea.  Endocrine: Negative for increased urination.  Genitourinary:  Negative for involuntary urination.  Musculoskeletal:  Positive for joint pain, joint pain, myalgias, muscle weakness, morning stiffness, muscle tenderness and myalgias. Negative for gait problem and joint swelling.  Skin:  Negative for color change, rash, hair loss and sensitivity to sunlight.  Allergic/Immunologic: Negative for susceptible to infections.  Neurological:  Positive for dizziness and headaches.  Hematological:  Negative for swollen glands.  Psychiatric/Behavioral:  Positive for sleep disturbance. Negative for depressed mood. The patient is not nervous/anxious.     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   Muscle weakness (generalized) 09/17/2013   Scoliosis concern 09/17/2013   Stiffness of joint, not elsewhere classified, pelvic region and thigh 09/17/2013   Difficulty in walking(719.7) 09/17/2013    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  Osteopenia    Raynaud's disease    Raynaud'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 01/27/2016   Rosacea 01/27/2016   Varicose veins    Vitamin D deficiency 01/27/2016    Family History  Problem Relation Age of Onset   Cancer Mother    Cancer Father    Heart attack Father    Breast cancer Sister    Cancer Brother    Past Surgical  History:  Procedure Laterality Date   ABDOMINAL HYSTERECTOMY     CESAREAN SECTION     CHOLECYSTECTOMY     COLONOSCOPY  2023   COLONOSCOPY WITH PROPOFOL N/A 09/29/2021   Procedure: COLONOSCOPY WITH PROPOFOL;  Surgeon: Carol Ada, MD;  Location: [facility];  Service: Gastroenterology;  Laterality: N/A;   HEMOSTASIS CLIP PLACEMENT  09/29/2021   Procedure: HEMOSTASIS CLIP PLACEMENT;  Surgeon: Carol Ada, MD;  Location: [facility];  Service: Gastroenterology;;   INTESTINAL BLOCKAGE     POLYPECTOMY  09/29/2021   Procedure: POLYPECTOMY;  Surgeon: Carol Ada, MD;  Location: [facility];  Service: Gastroenterology;;   TONSILLECTOMY     Social History   Social History Narrative   Not on file   Immunization History  Administered Date(s) Administered   PFIZER(Purple Top)SARS-COV-2 Vaccination 02/21/2019, 03/14/2019, 11/19/2019     Objective: Vital Signs: BP 136/79 (BP Location: Left Arm, Patient Position: Sitting, Cuff Size: Normal)   Pulse 61   Resp 16   Ht '5\' 4"'$  (1.626 m)   Wt 134 lb 3.2 oz (60.9 kg)   BMI 23.04 kg/m    Physical Exam Vitals and nursing note reviewed.  Constitutional:      Appearance: She is well-developed.  HENT:     Head: Normocephalic and atraumatic.  Eyes:     Conjunctiva/sclera: Conjunctivae normal.  Cardiovascular:     Rate and Rhythm: Normal rate and regular rhythm.     Heart sounds: Normal heart sounds.  Pulmonary:     Effort: Pulmonary effort is normal.     Breath sounds: Normal breath sounds.  Abdominal:     General: Bowel sounds are normal.     Palpations: Abdomen is soft.  Musculoskeletal:     Cervical back: Normal range of motion.  Skin:    General: Skin is warm and dry.     Capillary Refill: Capillary refill takes less than 2 seconds.  Neurological:     Mental Status: She is alert and oriented to person, place, and time.  Psychiatric:        Behavior: Behavior normal.      Musculoskeletal Exam: C-spine has limited range of  motion with lateral rotation.  Right trapezius muscle tension tenderness noted bilaterally.  No midline spinal tenderness along the thoracic spine noted.  Shoulder joints, elbow joints, wrist joints, MCPs, PIPs, DIPs have good range of motion with no synovitis.  Complete fist formation bilaterally.  PIP and DIP thickening consistent with osteoarthritis of both hands.  Hip joints have good range of motion with no groin pain.  Tenderness over the right trochanteric bursa.  Knee joints have good range of motion with no warmth or effusion.  Ankle joints have good range of motion with no tenderness or joint swelling.  No tenderness over MTP joints.  CDAI Exam: CDAI Score: -- Patient Global: --; Provider Global: -- Swollen: --; Tender: -- Joint Exam 11/01/2021   No joint exam has been documented for this visit   There is currently no information documented on the homunculus. Go to the Rheumatology activity and complete the  homunculus joint exam.  Investigation: No additional findings.  Imaging: XR HIP UNILAT W OR W/O PELVIS 2-3 VIEWS RIGHT  Result Date: 10/13/2021 2 views of the right hip including AP and lateral femoral ordered and reviewed by myself.  Radiographs demonstrate some mild-moderate narrowing about the hip over the inferior aspect of the hip, although certainly not severe DJD.  No acute fracture.  There is some bilateral SI joint sclerosis and bony spurring at the inferior aspect of the SI joint.   Recent Labs: Lab Results  Component Value Date   WBC 7.7 11/01/2020   HGB 14.7 11/01/2020   PLT 396 11/01/2020   NA 139 11/01/2020   K 4.9 11/01/2020   CL 103 11/01/2020   CO2 28 11/01/2020   GLUCOSE 90 11/01/2020   BUN 25 11/01/2020   CREATININE 0.92 11/01/2020   BILITOT 0.7 11/01/2020   AST 21 11/01/2020   ALT 10 11/01/2020   PROT 6.9 11/01/2020   CALCIUM 10.2 11/01/2020   GFRAA 67 11/27/2019    Speciality Comments: Reclast - 04/30/16, 11/13/19,11/17/20  Procedures:   Trigger Point Inj  Date/Time: 11/01/2021 12:08 PM  Performed by: Ofilia Neas, PA-C Authorized by: Ofilia Neas, PA-C   Consent Given by:  Patient Site marked: the procedure site was marked   Timeout: prior to procedure the correct patient, procedure, and site was verified   Indications:  Pain Total # of Trigger Points:  1 (Right trapezius muscle trigger point injection) Location: neck   Needle Size:  27 G Approach:  Dorsal Patient tolerance:  Patient tolerated the procedure well with no immediate complications  Allergies: Codeine and Lisinopril   Assessment / Plan:     Visit Diagnoses: Primary osteoarthritis of both hands: She has severe PIP and DIP thickening consistent with osteoarthritis of both hands.  No synovitis was noted on examination today.  She was able to make a complete fist bilaterally.  Primary osteoarthritis of both feet: She is not experiencing any increased discomfort in her feet at this time.  She is wearing proper fitting shoes.  DDD (degenerative disc disease), cervical: She has limited range of motion of the C-spine especially with lateral rotation.  She has right trapezius muscle tension and tenderness.  A right trigger point injection was performed today.  Procedure note was completed above.  Aftercare was discussed.  Trapezius muscle spasm: She presents today with trapezius muscle tension and tenderness especially on the right side.  She is on experiencing muscle spasms intermittently.  She has been taking methocarbamol 500 mg 1 tablet daily as needed for muscle spasms and tizanidine 2 mg at bedtime as needed for muscle spasms.  A right trapezius trigger point injection was performed today in the office.  She tolerated procedure well.  She plans on following up with Dr. Rolena Infante for further evaluation and management of her thoracic pain.  DDD (degenerative disc disease), thoracic: She presents today with increased pain in the thoracic spine.  She is not  experiencing any shortness of breath or pleuritic chest pain.  She has right paraspinal muscle tenderness as well as trapezius muscle tension and tenderness on the right side.  She reached out to Dr. Wilmon Arms office for further evaluation but according to the patient Dr. Rolena Infante is on vacation.  She plans on following following up with Dr. Rolena Infante once he has returned.  She previously has had relief with trigger point injections.  A right trapezius trigger point injection was performed today in the office.  She tolerated procedure well.  Procedure note was completed above.  Aftercare was discussed.  Postural kyphosis of thoracolumbar region: Unchanged.  DDD (degenerative disc disease), lumbar: Chronic pain  Trochanteric bursitis of right hip: Under the care of Dr. Rolena Infante.  She had a recent right trochanteric bursa cortisone injection which has provided significant relief.  She has continued home exercises.  On examination she continues to have some residual tenderness over the right trochanteric bursa.  Raynaud'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: Not currently active.   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 DEXA updated on 10/10/2021: Right femoral neck T score -2.6, left femoral neck T score -2.5.  DEXA results were reviewed with the patient today in the office. History of ankle and wrist fracture and previous cortisone injections. Previous therapy: Fosamax-length of therapy is undetermined.  Previous intolerance due to worsening symptoms of reflux.  Patient was switched from Fosamax to Reclast.  She had 1 Reclast infusion in 2018 followed by an infusion on 11/13/2019 and 11/17/2020.  She tolerated Reclast without any side effects. Different treatment options were discussed today in detail.  She has not had any improvement while on Fosamax and remains within the osteoporosis range.  She will benefit from initiating Forteo daily  injections.  Indications, contraindications, potential side effects of Forteo were discussed today in detail.  We will plan Forteo through her insurance and once approved she will return to the office for administration of the first injection as requested. CBC, CMP, and vitamin D will be checked today prior to initiating therapy.- Plan: CBC with Differential/Platelet, Comprehensive metabolic panel, VITAMIN D 25 Hydroxy (Vit-D Deficiency, Fractures)  Vitamin D deficiency -CBC, CMP, vitamin D will be checked today prior to initiating Forteo.  Plan: CBC with Differential/Platelet, Comprehensive metabolic panel, VITAMIN D 25 Hydroxy (Vit-D Deficiency, Fractures)  Other medical conditions are listed as follows:  History of attention deficit disorder  History of rosacea  Orders: Orders Placed This Encounter  Procedures   Trigger Point Inj   CBC with Differential/Platelet   Comprehensive metabolic panel   VITAMIN D 25 Hydroxy (Vit-D Deficiency, Fractures)   No orders of the defined types were placed in this encounter.   Follow-Up Instructions: Return in 6 months (on 05/03/2022) for Osteoarthritis, DDD, Osteoporosis.   Ofilia Neas, PA-C  Note - This record has been created using Dragon software.  Chart creation errors have been sought, but may not always  have been located. Such creation errors do not reflect on  the standard of medical care.

## 2021-10-20 ENCOUNTER — Telehealth: Payer: Self-pay | Admitting: *Deleted

## 2021-10-20 NOTE — Telephone Encounter (Signed)
Received DEXA results from [facility].  Date of Scan: 10/10/2021   Lowest T-score:-2.6  BMD:0.558  Lowest site measured:Right Femoral Neck  DX: Osteoporosis  Significant changes in BMD and site measured (5% and above):n/a  Current Regimen: Calcium, Vitamin D and Reclast. Last Infusion 11/17/2020.  Recommendation:Discuss at follow up visit.   Reviewed by:Hazel Sams, PA-C  Next Appointment:  11/01/2021.

## 2021-10-23 ENCOUNTER — Encounter (HOSPITAL_COMMUNITY): Payer: Self-pay | Admitting: Physical Therapy

## 2021-10-23 ENCOUNTER — Telehealth: Payer: Self-pay | Admitting: Sports Medicine

## 2021-10-23 ENCOUNTER — Ambulatory Visit (HOSPITAL_COMMUNITY): Payer: Medicare HMO | Attending: Physician Assistant | Admitting: Physical Therapy

## 2021-10-23 DIAGNOSIS — M6281 Muscle weakness (generalized): Secondary | ICD-10-CM | POA: Insufficient documentation

## 2021-10-23 DIAGNOSIS — R262 Difficulty in walking, not elsewhere classified: Secondary | ICD-10-CM | POA: Diagnosis present

## 2021-10-23 DIAGNOSIS — M25551 Pain in right hip: Secondary | ICD-10-CM | POA: Diagnosis present

## 2021-10-23 DIAGNOSIS — R2689 Other abnormalities of gait and mobility: Secondary | ICD-10-CM | POA: Insufficient documentation

## 2021-10-23 NOTE — Telephone Encounter (Signed)
Pt states she seen Dr Rolena Infante a wk and 1/2 ago and has some medical questions. Please call pt at [phone].

## 2021-10-23 NOTE — Therapy (Signed)
OUTPATIENT PHYSICAL THERAPY LOWER EXTREMITY EVALUATION   Patient Name: Lauren Knox MRN: [id number] DOB:Aug 06, 1945, 76 y.o., female Today's Date: 10/23/2021   PT End of Session - 10/23/21 0804     Visit Number 1    Number of Visits 8    Date for PT Re-Evaluation 11/20/21    Authorization Type [employer]    Authorization Time Period No auth, no VL    Progress Note Due on Visit 8    PT Start Time 0813    PT Stop Time 0901    PT Time Calculation (min) 48 min    Activity Tolerance Patient tolerated treatment well    Behavior During Therapy [facility] for tasks assessed/performed             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   Osteopenia    Raynaud's disease    Raynaud'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 01/27/2016   Rosacea 01/27/2016   Varicose veins    Vitamin D deficiency 01/27/2016   Past Surgical History:  Procedure Laterality Date   ABDOMINAL HYSTERECTOMY     CESAREAN SECTION     CHOLECYSTECTOMY     COLONOSCOPY  2023   COLONOSCOPY WITH PROPOFOL N/A 09/29/2021   Procedure: COLONOSCOPY WITH PROPOFOL;  Surgeon: Carol Ada, MD;  Location: [facility];  Service: Gastroenterology;  Laterality: N/A;   HEMOSTASIS CLIP PLACEMENT  09/29/2021   Procedure: HEMOSTASIS CLIP PLACEMENT;  Surgeon: Carol Ada, MD;  Location: [facility];  Service: Gastroenterology;;   INTESTINAL BLOCKAGE     POLYPECTOMY  09/29/2021   Procedure: POLYPECTOMY;  Surgeon: Carol Ada, MD;  Location: [facility];  Service: Gastroenterology;;   TONSILLECTOMY     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 01/27/2016   ADD (attention  deficit disorder) 01/27/2016   Rosacea 01/27/2016   Vitamin D deficiency 01/27/2016   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   Muscle weakness (generalized) 09/17/2013   Scoliosis concern 09/17/2013   Stiffness of joint, not elsewhere classified, pelvic region and thigh 09/17/2013   Difficulty in walking(719.7) 09/17/2013    PCP: Asencion Noble, MD  REFERRING PROVIDER: Elba Barman, DO   REFERRING DIAG: [phone] (ICD-10-CM) - Greater trochanteric pain syndrome of right lower extremity   THERAPY DIAG:  Pain in right hip  Muscle weakness (generalized)  Other abnormalities of gait and mobility  Difficulty in walking, not elsewhere classified  Rationale for Evaluation and Treatment Rehabilitation  ONSET DATE: Early summer 2023  SUBJECTIVE:   SUBJECTIVE STATEMENT: Patient developed gradual hip pain "over the bone" on right side this summer, seen by rheumatologist and other specialists. Had a couple different injections of which she cannot recall, most recently about 1 week ago. There was temporary relief. She has recently been starting to develop some back pain, she believes from compensating from the back pain. Reports difficulty walking, especially stairs. Denies constitutional symptoms. No radicular symptoms reported.  PERTINENT HISTORY: See above  PAIN:  Are you having pain? Yes: NPRS scale: 6/10 Pain location: right hip Pain description: Sore Aggravating factors: walking, stairs Relieving factors: lying flat  PRECAUTIONS: None  WEIGHT BEARING RESTRICTIONS No  FALLS:  Has patient fallen in last 6 months? No  LIVING ENVIRONMENT: Lives with: lives with their family and lives with their spouse Lives in: House/apartment Stairs: Yes: Internal: 13 steps; on right going up and on left going up and External: 2 steps; on right going up, on left going up, and can reach both Has following equipment at home: None  OCCUPATION: Retired, hobbies - cook, yard work, reading  PLOF:  Independent  PATIENT GOALS Decrease pain, improve walking, standing, and stair ability, stand long enough to cook   OBJECTIVE:   DIAGNOSTIC FINDINGS: n /a  PATIENT SURVEYS:  FOTO 44%  COGNITION:  Overall cognitive status: Within functional limits for tasks assessed     SENSATION: WFL  PALPATION: TTP greater trochanter TrPs noted Rt glute max and medius  LOWER EXTREMITY ROM:  Active ROM Right eval Left eval  Hip flexion    Hip extension    Hip abduction    Hip adduction    Hip internal rotation 36 31  Hip external rotation 29 36  Knee flexion    Knee extension    Ankle dorsiflexion    Ankle plantarflexion    Ankle inversion    Ankle eversion     (Blank rows = not tested)  LUMBAR ROM:  Active ROM Eval  Flexion Distal 1/3 shin (P! Rt hip)  Extension WFL  Right Flexion Joint line  Left Flexion Joint line   LOWER EXTREMITY MMT:  MMT Right eval Left eval  Hip flexion 4+ 4+  Hip extension 4 4  Hip abduction 4+ 4+  Hip adduction 4+ 4+  Hip internal rotation    Hip external rotation 3+ 3+  Knee flexion 4+ 5  Knee extension 5 5  Ankle dorsiflexion 5 5  Ankle plantarflexion    Ankle inversion    Ankle eversion     (Blank rows = not tested)  LOWER EXTREMITY SPECIAL TESTS:  Hip special tests: Saralyn Pilar (FABER) test: positive , Hip scouring test: positive , and Anterior hip impingement test: positive  (Right) Slump test + Rt (posterior thigh - not into hip) SLR + Rt  FUNCTIONAL TESTS:  5 times sit to stand: 13.2 sec  GAIT: Time limited    TODAY'S TREATMENT: Eval  MMT ROM 5x sit to stand HEP  PATIENT EDUCATION:  Education details: findings HEP, PT role, POC, modalities as needed Person educated: Patient Education method: Explanation Education comprehension: verbalized understanding   HOME EXERCISE PROGRAM: Access Code: [email or web address] URL: [email or web address] Date: 10/23/2021 Prepared by: Candie Mile  Exercises - Supine  Bridge  - 2 x daily - 7 x weekly - 2 sets - 10 reps - 5 seconds hold - Supine Transversus Abdominis Bracing - Hands on Stomach  - 3 x daily - 7 x weekly - 3 sets - 10 reps - Supine Piriformis Stretch with Foot on Ground  - 3 x daily - 7 x weekly - 3 sets - 15-30 hold - Supine Figure 4 Piriformis Stretch (Mirrored)  - 3 x daily - 7 x weekly - 3 sets - 15-30 sec hold  ASSESSMENT:  CLINICAL IMPRESSION: Patient is a 76 y.o. female who was seen today for physical therapy evaluation and treatment for Right hip pain. Patient presents with deficits including , reduced strength, limited range of motion, decreased endurance, limited activity tolerance, abnormalities of gait, and pain, contributing to impaired functional mobility with ADLs and IADLs. Patient is currently restricted in ADLs as indicated by objective and subjective functional outcome measures, as well as reported history and objective measures taken during this  exam. Patient will benefit from skilled physical therapy intervention in order to improve function and reduce the impairments listed above.     OBJECTIVE IMPAIRMENTS Abnormal gait, decreased activity tolerance, decreased knowledge of condition, decreased mobility, difficulty walking, decreased ROM, decreased strength, hypomobility, increased fascial restrictions, increased muscle spasms, impaired flexibility, improper body mechanics, and pain.   ACTIVITY LIMITATIONS carrying, lifting, bending, sitting, standing, squatting, sleeping, stairs, transfers, bed mobility, and locomotion level  PARTICIPATION LIMITATIONS: meal prep, cleaning, laundry, shopping, community activity, and yard work  PERSONAL FACTORS Age, Past/current experiences, and Time since onset of injury/illness/exacerbation are also affecting patient's functional outcome.   REHAB POTENTIAL: Good  CLINICAL DECISION MAKING: Stable/uncomplicated  EVALUATION COMPLEXITY: Low   GOALS: Goals reviewed with patient?  Yes  SHORT TERM GOALS: Target date: 11/06/21  Patient will be independent with initial HEP and self-management strategies to improve functional outcomes Baseline: Initiated Goal status: INITIAL    2. Eliminate myofascial trigger points and TTP of Rt glute and greater trochanter.   Baseline: TrPs in Rt glute max and med  Goal status: INITIAL  LONG TERM GOALS: Target date: 11/20/21  Patient will be independent with advanced HEP and self-management strategies to improve functional outcomes Baseline:  Goal status: INITIAL  2.  Patient will improve FOTO score to predicted of 59%value to indicate improvement in functional outcomes Baseline: 44 Goal status: INITIAL  3.  Patient will report 75% improvement in symptoms to progress towards PLOF. Baseline:  Goal status: INITIAL  4. Patient will have equal to or > 4+/5 MMT throughout BIL LEs to improve ability to perform functional mobility, stair ambulation and ADLs.  Baseline: See above Goal status: INITIAL  5. Patient will improve 5 time sit to stand < 12.6 sec to demonstrate significant improvement in functional strength with transfers. Baseline: 13.2 sec Goal status: INITIAL    PLAN: PT FREQUENCY: 2x/week  PT DURATION: 4 weeks  PLANNED INTERVENTIONS: Therapeutic exercises, Therapeutic activity, Neuromuscular re-education, Balance training, Gait training, Patient/Family education, Self Care, Joint mobilization, Joint manipulation, Stair training, DME instructions, Dry Needling, Electrical stimulation, Spinal manipulation, Spinal mobilization, Cryotherapy, Moist heat, Taping, Traction, Ultrasound, Biofeedback, Ionotophoresis '4mg'$ /ml Dexamethasone, Manual therapy, and Re-evaluation  PLAN FOR NEXT SESSION: 2MWT, Consider dry needling Rt glute max and Med. Progressive strengthening of Rt LE proximal muscles, and core. (Has some lumbar symptoms overlapping Rt hip)   Candie Mile, PT, DPT Physical Therapist Acute Rehabilitation  [facility] [place]  10/23/2021, 10:34 AM

## 2021-10-30 ENCOUNTER — Telehealth: Payer: Self-pay | Admitting: Sports Medicine

## 2021-10-30 NOTE — Telephone Encounter (Signed)
Pt called

## 2021-11-01 ENCOUNTER — Telehealth: Payer: Self-pay | Admitting: Pharmacist

## 2021-11-01 ENCOUNTER — Ambulatory Visit: Payer: Medicare HMO | Attending: Physician Assistant | Admitting: Physician Assistant

## 2021-11-01 ENCOUNTER — Other Ambulatory Visit (HOSPITAL_COMMUNITY): Payer: Self-pay

## 2021-11-01 ENCOUNTER — Encounter: Payer: Self-pay | Admitting: Physician Assistant

## 2021-11-01 VITALS — BP 136/79 | HR 61 | Resp 16 | Ht 64.0 in | Wt 134.2 lb

## 2021-11-01 DIAGNOSIS — M4005 Postural kyphosis, thoracolumbar region: Secondary | ICD-10-CM

## 2021-11-01 DIAGNOSIS — M5136 Other intervertebral disc degeneration, lumbar region: Secondary | ICD-10-CM

## 2021-11-01 DIAGNOSIS — M5134 Other intervertebral disc degeneration, thoracic region: Secondary | ICD-10-CM

## 2021-11-01 DIAGNOSIS — M62838 Other muscle spasm: Secondary | ICD-10-CM

## 2021-11-01 DIAGNOSIS — M19041 Primary osteoarthritis, right hand: Secondary | ICD-10-CM

## 2021-11-01 DIAGNOSIS — M19071 Primary osteoarthritis, right ankle and foot: Secondary | ICD-10-CM | POA: Diagnosis not present

## 2021-11-01 DIAGNOSIS — Z8659 Personal history of other mental and behavioral disorders: Secondary | ICD-10-CM

## 2021-11-01 DIAGNOSIS — M810 Age-related osteoporosis without current pathological fracture: Secondary | ICD-10-CM

## 2021-11-01 DIAGNOSIS — M5030 Other cervical disc degeneration, unspecified cervical region: Secondary | ICD-10-CM

## 2021-11-01 DIAGNOSIS — I73 Raynaud'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: Secondary | ICD-10-CM

## 2021-11-01 DIAGNOSIS — M19042 Primary osteoarthritis, left hand: Secondary | ICD-10-CM

## 2021-11-01 DIAGNOSIS — E559 Vitamin D deficiency, unspecified: Secondary | ICD-10-CM

## 2021-11-01 DIAGNOSIS — M19072 Primary osteoarthritis, left ankle and foot: Secondary | ICD-10-CM

## 2021-11-01 DIAGNOSIS — Z872 Personal history of diseases of the skin and subcutaneous tissue: Secondary | ICD-10-CM

## 2021-11-01 DIAGNOSIS — M7061 Trochanteric bursitis, right hip: Secondary | ICD-10-CM

## 2021-11-01 NOTE — Telephone Encounter (Signed)
Submitted a Prior Authorization request to [facility] for Lauren Knox via CoverMyMeds. Will update once we receive a response.  Key: [id number]  [place]iva, PharmD, MPH, BCPS, CPP Clinical Pharmacist (Rheumatology and Pulmonology)

## 2021-11-01 NOTE — Progress Notes (Signed)
Pharmacy Note  Subjective:  Patient presents today to [facility] for follow up office visit.   Patient was seen by the pharmacist for counseling on Forteo.   She has received three Reclast infusions with no benefit based on last updated DEXA report  Objective: CMP     Component Value Date/Time   NA 139 11/01/2020 1107   NA 141 06/21/2015 0000   K 4.9 11/01/2020 1107   CL 103 11/01/2020 1107   CO2 28 11/01/2020 1107   GLUCOSE 90 11/01/2020 1107   BUN 25 11/01/2020 1107   CREATININE 0.92 11/01/2020 1107   CALCIUM 10.2 11/01/2020 1107   PROT 6.9 11/01/2020 1107   AST 21 11/01/2020 1107   ALT 10 11/01/2020 1107   BILITOT 0.7 11/01/2020 1107   GFRNONAA 58 (L) 11/27/2019 1515   GFRAA 67 11/27/2019 1515    Vitamin D Lab Results  Component Value Date   VD25OH 42 11/01/2020    DEXA scan completed on 10/10/21 - no significant change in BMD (by 5%) Lowest T-score -2.6 at right femoral neck.  Assessment/Plan:    Counseled patient on purpose, proper use, storage, and adverse effects of Forteo. Discussed the Forteo is PTH peptide agonist which results in stimulation of osteoblast and bone mass. Reviewed that Forteo treatment course is for 2 years. Discussed that pen is stable for 28 days after first use and must be stored in fridge after each dose. Counseled patient that [place] is a medication that must be injected once daily and that prescription for pen needles will be sent with Forteo prescription.  Advised patient to continue taking calcium 1200 mg daily and vitamin D supplement.  Reviewed the most common adverse effects of Forteo including orthostatic hypotension, GI upset, injection site reaction, and joint pain/arthralgia.  Discussed injection site reaction management and injection site locations. Discussed alternating injection site.  Discussed management of dizziness (which can occur for up to 4 hours after injection) including adequate hydration, slowly rising from  bed/chairs to prevent fals, and taking Forteo at night if needed.  We walked through [employer] administration with demo pen and pen needle.  Discussed appropriate disposal of sharps.     Forteo prescription pending baseline labs and pharmacy benefits investigation.  [employer] patient assistance application completed today by patient. She will call back with annual household income   All questions encouraged and answered.   Knox Saliva, PharmD, MPH, BCPS, CPP Clinical Pharmacist (Rheumatology and Pulmonology)

## 2021-11-01 NOTE — Progress Notes (Signed)
Plt count is elevated. Rest of CBC WNL.

## 2021-11-01 NOTE — Telephone Encounter (Signed)
Please start [place].  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

## 2021-11-02 ENCOUNTER — Other Ambulatory Visit (HOSPITAL_COMMUNITY): Payer: Self-pay

## 2021-11-02 ENCOUNTER — Encounter (HOSPITAL_COMMUNITY): Payer: Medicare HMO | Admitting: Physical Therapy

## 2021-11-02 LAB — COMPREHENSIVE METABOLIC PANEL
AG Ratio: 1.5 (calc) (ref 1.0–2.5)
ALT: 8 U/L (ref 6–29)
AST: 17 U/L (ref 10–35)
Albumin: 4.4 g/dL (ref 3.6–5.1)
Alkaline phosphatase (APISO): 121 U/L (ref 37–153)
BUN: 20 mg/dL (ref 7–25)
CO2: 27 mmol/L (ref 20–32)
Calcium: 10.7 mg/dL — ABNORMAL HIGH (ref 8.6–10.4)
Chloride: 102 mmol/L (ref 98–110)
Creat: 0.88 mg/dL (ref 0.60–1.00)
Globulin: 2.9 g/dL (calc) (ref 1.9–3.7)
Glucose, Bld: 100 mg/dL — ABNORMAL HIGH (ref 65–99)
Potassium: 4.5 mmol/L (ref 3.5–5.3)
Sodium: 139 mmol/L (ref 135–146)
Total Bilirubin: 0.8 mg/dL (ref 0.2–1.2)
Total Protein: 7.3 g/dL (ref 6.1–8.1)

## 2021-11-02 LAB — CBC WITH DIFFERENTIAL/PLATELET
Absolute Monocytes: 752 cells/uL (ref 200–950)
Basophils Absolute: 59 cells/uL (ref 0–200)
Basophils Relative: 0.6 %
Eosinophils Absolute: 149 cells/uL (ref 15–500)
Eosinophils Relative: 1.5 %
HCT: 44 % (ref 35.0–45.0)
Hemoglobin: 15.4 g/dL (ref 11.7–15.5)
Lymphs Abs: 2237 cells/uL (ref 850–3900)
MCH: 32.4 pg (ref 27.0–33.0)
MCHC: 35 g/dL (ref 32.0–36.0)
MCV: 92.4 fL (ref 80.0–100.0)
MPV: 10 fL (ref 7.5–12.5)
Monocytes Relative: 7.6 %
Neutro Abs: 6702 cells/uL (ref 1500–7800)
Neutrophils Relative %: 67.7 %
Platelets: 471 10*3/uL — ABNORMAL HIGH (ref 140–400)
RBC: 4.76 10*6/uL (ref 3.80–5.10)
RDW: 12.4 % (ref 11.0–15.0)
Total Lymphocyte: 22.6 %
WBC: 9.9 10*3/uL (ref 3.8–10.8)

## 2021-11-02 LAB — VITAMIN D 25 HYDROXY (VIT D DEFICIENCY, FRACTURES): Vit D, 25-Hydroxy: 31 ng/mL (ref 30–100)

## 2021-11-02 NOTE — Telephone Encounter (Signed)
Patient called and provided household income information. Advised that we are still awaiting determination from insurance.  She states that if she is denied Forteo, she'd like to move towards next medication option rather than complete financial hardship/appeal process through patient assistance  Knox Saliva, PharmD, MPH, BCPS, CPP Clinical Pharmacist (Rheumatology and Pulmonology)

## 2021-11-02 NOTE — Progress Notes (Signed)
Calcium is elevated-10.7.  please clarify if the patient has been taking a calcium and vitamin D supplement? Hold calcium supplement if so.  Vitamin D is WNL.

## 2021-11-02 NOTE — Telephone Encounter (Signed)
Received notification from [facility] regarding a prior authorization for [place]. Authorization has been APPROVED from 01/15/21 to 10/22/2023.   Per test claim, copay for 28 days supply is $50  Patient can fill through [employer]: [phone]   Paitent may be amenable to this copay though it might increase with the new year. Will discuss with patient   Knox Saliva, PharmD, MPH, BCPS, CPP Clinical Pharmacist (Rheumatology and Pulmonology)

## 2021-11-06 ENCOUNTER — Encounter (HOSPITAL_COMMUNITY): Payer: Medicare HMO | Admitting: Physical Therapy

## 2021-11-07 ENCOUNTER — Encounter: Payer: Self-pay | Admitting: Sports Medicine

## 2021-11-07 ENCOUNTER — Ambulatory Visit: Payer: Medicare HMO | Admitting: Sports Medicine

## 2021-11-07 ENCOUNTER — Ambulatory Visit (INDEPENDENT_AMBULATORY_CARE_PROVIDER_SITE_OTHER): Payer: Medicare HMO

## 2021-11-07 DIAGNOSIS — M25511 Pain in right shoulder: Secondary | ICD-10-CM

## 2021-11-07 DIAGNOSIS — M549 Dorsalgia, unspecified: Secondary | ICD-10-CM

## 2021-11-07 DIAGNOSIS — M9908 Segmental and somatic dysfunction of rib cage: Secondary | ICD-10-CM | POA: Diagnosis not present

## 2021-11-07 DIAGNOSIS — M9902 Segmental and somatic dysfunction of thoracic region: Secondary | ICD-10-CM

## 2021-11-07 DIAGNOSIS — M546 Pain in thoracic spine: Secondary | ICD-10-CM

## 2021-11-07 DIAGNOSIS — G2589 Other specified extrapyramidal and movement disorders: Secondary | ICD-10-CM

## 2021-11-07 MED ORDER — METHYLPREDNISOLONE ACETATE 40 MG/ML IJ SUSP
40.0000 mg | INTRAMUSCULAR | Status: AC | PRN
  Administered 2021-11-07: 40 mg via INTRAMUSCULAR

## 2021-11-07 MED ORDER — LIDOCAINE HCL 1 % IJ SOLN
1.0000 mL | INTRAMUSCULAR | Status: AC | PRN
  Administered 2021-11-07: 1 mL

## 2021-11-07 MED ORDER — BUPIVACAINE HCL 0.25 % IJ SOLN
1.0000 mL | INTRAMUSCULAR | Status: AC | PRN
  Administered 2021-11-07: 1 mL

## 2021-11-07 NOTE — Progress Notes (Signed)
New problem; upper back pain  Pain was intermittent, however has worsened over the last month   Unable to do PT for the hip due to this  States that it spasms intermittently   Unable to rotate/bend over without pain

## 2021-11-07 NOTE — Progress Notes (Signed)
Lauren Knox - 76 y.o. female MRN [id number]  Date of birth: 12-10-1945  Office Visit Note: Visit Date: 11/07/2021 PCP: Asencion Noble, MD Referred by: Asencion Noble, MD  Subjective: Chief Complaint  Patient presents with   Middle Back - Pain   HPI: Lauren Knox is a pleasant 76 y.o. female who presents today for acute on chronic right-sided low back and mid back pain with intermittent spasms.  [name] states that over the last year or 2 she has intermittently had this right-sided mid back pain.  Usually it would go away on its own, however over the last month this has been exacerbated and has been associated with intermittent spasms.  This is limiting her ability to do physical therapy for the hip.  Feels like a shooting and burning pain that goes up and down the right mid back.  She does take tizanidine at nighttime which does help control the pain and spasms occasionally.  Occasionally will take ibuprofen as needed.  No radicular symptoms of the upper or lower extremities.  She gets some pain at night when trying to lie down, pain is worse with twisting and bending about the thoracic spine as well.  Pertinent ROS were reviewed with the patient and found to be negative unless otherwise specified above in HPI.   Assessment & Plan: Visit Diagnoses:  1. Pain in thoracic spine   2. Segmental and somatic dysfunction of thoracic region   3. Somatic dysfunction of rib cage region   4. Scapular dyskinesis   5. Trigger point of right shoulder region   6. Trigger point with back pain    Plan: Reviewed Denelle's x-rays and her posture today which do show kyphosis of the thoracic spine, I do think her pain is somewhat postural in nature.  Her pain is likely multifactorial as she does have some trigger points, which we did proceed after discussion with multiple trigger point injections to the inferior medial scapular border and right mid thoracic paraspinal musculature.  She does have a  degree of scapular dyskinesis as well, we will get her started on home exercises for scapular retraction and rehab.  Also gave her postural HEP and attempt to improve her ergonomics and lessen her kyphotic curve.  She has been undergoing some physical therapy for the hip, they may add some of these exercises for the back as well if desired.  I would also like her to go see one of my colleagues, Dr. [name] for some OMT for the musculature of the thoracic spine and her scapular and rib cage somatic dysfunction.  She will follow-up with me in about 4 weeks for reevaluation.  Follow-up: Return for 3-4 weeks for the back with Dr. Rolena Infante .   Meds & Orders: No orders of the defined types were placed in this encounter.   Orders Placed This Encounter  Procedures   XR Thoracic Spine 2 View   AMB referral to sports medicine     Procedures: Trigger Point Inj  Date/Time: 11/07/2021 1:05 PM  Performed by: Elba Barman, DO Authorized by: Elba Barman, DO   Total # of Trigger Points:  2 Location: back   Needle Size:  25 G Approach:  Dorsal Medications #1:  1 mL lidocaine 1 %; 1 mL bupivacaine 0.25 %; 40 mg methylPREDNISolone acetate 40 MG/ML Comments: Procedure: Trigger point injection, back After discussion on R/B/I and informed verbal consent was obtained, a timeout was conducted. The patient was placed in a prone position  on the examination table and the area of maximal tenderness was identified over the medial scapular border and right mid-back.  This area was cleansed with Betadine and multiple alcohol swabs. Ethyl chloride was used for local anesthesia. Using a 25-gauge 1.5 inch needle the trigger point(s) was subsequently injected with a mixture of 1 cc of methylprednisolone 40 mg/mL and 1 cc of 1% lidocaine without epinephrine and 1 cc of bupivicaine 0.25%, with a total of 1cc of injectate into each trigger point. A band-aid was applied following. Patient tolerated procedure well, there were no  post-injection complications. Post-procedure instructions were given.         Clinical History: No specialty comments available.  She reports that she has never smoked. She has been exposed to tobacco smoke. She has never used smokeless tobacco. No results for input(s): "HGBA1C", "LABURIC" in the last 8760 hours.  Objective:    Physical Exam  Gen: Well-appearing, in no acute distress; non-toxic CV: Regular Rate. Well-perfused. Warm.  Resp: Breathing unlabored on room air; no wheezing. Psych: Fluid speech in conversation; appropriate affect; normal thought process Neuro: Sensation intact throughout. No gross coordination deficits.   Ortho Exam - Thoracic spine: No midline spinous process TTP, no scoliosis.  There is a trigger point and paraspinal hypertonicity of the right musculature of the mid spine.  Positive TTP and trigger point of the mid inferior medial scapular border.  There is abnormal scapular motion of the right scapula with range of motion of the upper extremities.  No pain with flexion and extension.  There is some pain with twisting about the spine to the left.  Full range of motion of bilateral upper extremities.  - General posture: Moderate kyphosis of the mid thoracic spine  Imaging: XR Thoracic Spine 2 View  Result Date: 11/07/2021 2 views of the thoracic spine including AP and lateral films were ordered and reviewed by myself.  Radiographs demonstrate no significant scoliosis, although there is at least moderate kyphosis of the thoracic spine.  There is anterior bridging with osteophytes of T9-T10 and T10-T11.  No evidence of compression fracture or acute fracture.   Past Medical/Family/Surgical/Social History: Medications & Allergies reviewed per EMR, new medications updated. 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   Muscle weakness (generalized) 09/17/2013   Scoliosis concern 09/17/2013   Stiffness of joint, not elsewhere classified, pelvic region and thigh 09/17/2013   Difficulty in walking(719.7) 09/17/2013   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   Osteopenia    Raynaud's disease    Raynaud'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 01/27/2016   Rosacea 01/27/2016   Varicose veins    Vitamin D deficiency 01/27/2016   Family History  Problem Relation Age of Onset   Cancer Mother    Cancer Father    Heart attack Father    Breast cancer Sister    Cancer Brother    Past Surgical History:  Procedure Laterality Date   ABDOMINAL HYSTERECTOMY     CESAREAN SECTION     CHOLECYSTECTOMY  COLONOSCOPY  2023   COLONOSCOPY WITH PROPOFOL N/A 09/29/2021   Procedure: COLONOSCOPY WITH PROPOFOL;  Surgeon: Carol Ada, MD;  Location: [facility];  Service: Gastroenterology;  Laterality: N/A;   HEMOSTASIS CLIP PLACEMENT  09/29/2021   Procedure: HEMOSTASIS CLIP PLACEMENT;  Surgeon: Carol Ada, MD;  Location: [facility];  Service: Gastroenterology;;   INTESTINAL BLOCKAGE     POLYPECTOMY  09/29/2021   Procedure: POLYPECTOMY;  Surgeon: Carol Ada, MD;  Location: [facility];  Service: Gastroenterology;;   TONSILLECTOMY     Social History   Occupational History   Not on file  Tobacco Use   Smoking status: Never    Passive exposure: Past   Smokeless tobacco: Never  Vaping Use   Vaping Use: Never used  Substance and Sexual Activity   Alcohol use: Yes    Alcohol/week: 4.0 standard drinks of alcohol    Types: 4 Glasses of wine per week     Comment: occ   Drug use: No   Sexual activity: Not on file

## 2021-11-07 NOTE — Telephone Encounter (Signed)
Spoke with patient about $50 monthly copay for Forteo. She states that she can afford this. I did review that I cannot guarantee that the copay will be $50 per month with 2024 since a new deductible will kick in.  For now, we will submit [employer] patient assistance application and await determination. If denied, she'd like to wait until new year to see how much the copay is for Forteo for hte calendar year.  Patient aware of this plan.  Submitted Patient Assistance Application to [employer] for [place] along with provider portion, patient portion, med list, insurance card copy, and PA. Will update patient when we receive a response.  Fax# [phone] Phone# [phone]  Knox Saliva, PharmD, MPH, BCPS, CPP Clinical Pharmacist (Rheumatology and Pulmonology)

## 2021-11-07 NOTE — Telephone Encounter (Signed)
Received a fax from  [facility] regarding an approval for [place] patient assistance from 11/07/21 to 01/15/2023.   Phone# [phone]  Knox Saliva, PharmD, MPH, BCPS, CPP Clinical Pharmacist (Rheumatology and Pulmonology)

## 2021-11-08 NOTE — Progress Notes (Unsigned)
    [name] D.[place] Phone: [phone]   Assessment and Plan:     There are no diagnoses linked to this encounter.  ***   Pertinent previous records reviewed include ***   Follow Up: ***     Subjective:   I, Latrelle Fuston, am serving as a Education administrator for Doctor Glennon Mac  Chief Complaint: back pain   HPI:   11/09/2021 Patient is a 76 year old female complaining of back pain. Patient states   Relevant Historical Information: ***  Additional pertinent review of systems negative.   Current Outpatient Medications:    amLODipine (NORVASC) 5 MG tablet, Take 5 mg by mouth daily., Disp: , Rfl: 12   Calcium Carb-Cholecalciferol (CALCIUM 600/VITAMIN D3 PO), Take 1 tablet by mouth daily., Disp: , Rfl:    cholecalciferol (VITAMIN D) 1000 units tablet, Take 1,000 Units by mouth daily., Disp: , Rfl:    methocarbamol (ROBAXIN) 500 MG tablet, TAKE 1 TABLET(500 MG) BY MOUTH DAILY AS NEEDED FOR MUSCLE SPASMS, Disp: 30 tablet, Rfl: 0   methylphenidate (RITALIN) 10 MG tablet, Take 10-15 mg by mouth daily as needed (Focus)., Disp: , Rfl:    metroNIDAZOLE (METROCREAM) 0.75 % cream, Apply 1 Application topically every morning., Disp: , Rfl: 4   olmesartan (BENICAR) 20 MG tablet, Take 20 mg by mouth daily., Disp: , Rfl:    Probiotic Product (PRO-BIOTIC BLEND PO), Take 1 capsule by mouth daily., Disp: , Rfl:    tiZANidine (ZANAFLEX) 2 MG tablet, Take 2 mg by mouth every 8 (eight) hours as needed for muscle spasms., Disp: , Rfl:    tretinoin (RETIN-A) 0.025 % cream, Apply 1 application  topically at bedtime., Disp: , Rfl: 4   zoledronic acid (RECLAST) 5 MG/100ML SOLN injection, Inject into the vein. Yearly, Disp: , Rfl:    Objective:     There were no vitals filed for this visit.    There is no height or weight on file to calculate BMI.    Physical Exam:    ***   Electronically signed by:  [name] D.[facility] 7:54 AM 11/08/21

## 2021-11-09 ENCOUNTER — Ambulatory Visit: Payer: Medicare HMO | Admitting: Sports Medicine

## 2021-11-09 VITALS — BP 118/82 | HR 57 | Ht 64.0 in | Wt 135.0 lb

## 2021-11-09 DIAGNOSIS — M9902 Segmental and somatic dysfunction of thoracic region: Secondary | ICD-10-CM | POA: Diagnosis not present

## 2021-11-09 DIAGNOSIS — G8929 Other chronic pain: Secondary | ICD-10-CM | POA: Diagnosis not present

## 2021-11-09 DIAGNOSIS — M9905 Segmental and somatic dysfunction of pelvic region: Secondary | ICD-10-CM

## 2021-11-09 DIAGNOSIS — G2589 Other specified extrapyramidal and movement disorders: Secondary | ICD-10-CM | POA: Diagnosis not present

## 2021-11-09 DIAGNOSIS — M9903 Segmental and somatic dysfunction of lumbar region: Secondary | ICD-10-CM | POA: Diagnosis not present

## 2021-11-09 DIAGNOSIS — M546 Pain in thoracic spine: Secondary | ICD-10-CM | POA: Diagnosis not present

## 2021-11-09 MED ORDER — INSULIN PEN NEEDLE 31G X 5 MM MISC: 2 refills | Status: DC

## 2021-11-09 NOTE — Telephone Encounter (Signed)
Spoke with patient regarding Forteo patient assistance approval. She states she received a text message with approval. Advised her to call [employer] on Monday if she has not received a call so that she can schedule shipment to her home.  Rx for pen needles sent to pharmacy. Pt advised to pick up when ready to pick up  Knox Saliva, PharmD, MPH, BCPS, CPP Clinical Pharmacist (Rheumatology and Pulmonology)

## 2021-11-09 NOTE — Patient Instructions (Addendum)
Good to see you  Shoulder HEP  3-4 week follow up MS[facility]

## 2021-11-10 ENCOUNTER — Encounter (HOSPITAL_COMMUNITY): Payer: Self-pay

## 2021-11-10 ENCOUNTER — Ambulatory Visit (HOSPITAL_COMMUNITY): Payer: Medicare HMO

## 2021-11-10 DIAGNOSIS — M25551 Pain in right hip: Secondary | ICD-10-CM | POA: Diagnosis not present

## 2021-11-10 DIAGNOSIS — M6281 Muscle weakness (generalized): Secondary | ICD-10-CM

## 2021-11-10 DIAGNOSIS — R262 Difficulty in walking, not elsewhere classified: Secondary | ICD-10-CM

## 2021-11-10 DIAGNOSIS — R2689 Other abnormalities of gait and mobility: Secondary | ICD-10-CM

## 2021-11-10 NOTE — Therapy (Signed)
OUTPATIENT PHYSICAL THERAPY LOWER EXTREMITY TREATMENT   Patient Name: Lauren Knox MRN: [id number] DOB:1946-01-10, 76 y.o., female Today's Date: 11/10/2021   PT End of Session - 11/10/21 0920     Visit Number 2    Number of Visits 8    Date for PT Re-Evaluation 11/20/21    Authorization Type [employer]    Authorization Time Period No auth, no VL    Progress Note Due on Visit 8    PT Start Time 0819    PT Stop Time 0903    PT Time Calculation (min) 44 min    Activity Tolerance Patient tolerated treatment well    Behavior During Therapy 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   Osteopenia    Raynaud's disease    Raynaud'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 01/27/2016   Rosacea 01/27/2016   Varicose veins    Vitamin D deficiency 01/27/2016   Past Surgical History:  Procedure Laterality Date   ABDOMINAL HYSTERECTOMY     CESAREAN SECTION     CHOLECYSTECTOMY     COLONOSCOPY  2023   COLONOSCOPY WITH PROPOFOL N/A 09/29/2021   Procedure: COLONOSCOPY WITH PROPOFOL;  Surgeon: Carol Ada, MD;  Location: [facility];  Service: Gastroenterology;  Laterality: N/A;   HEMOSTASIS CLIP PLACEMENT  09/29/2021   Procedure: HEMOSTASIS CLIP PLACEMENT;  Surgeon: Carol Ada, MD;  Location: [facility];  Service: Gastroenterology;;   INTESTINAL BLOCKAGE     POLYPECTOMY  09/29/2021   Procedure: POLYPECTOMY;  Surgeon: Carol Ada, MD;  Location: [facility];  Service: Gastroenterology;;   TONSILLECTOMY     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 01/27/2016   ADD (attention  deficit disorder) 01/27/2016   Rosacea 01/27/2016   Vitamin D deficiency 01/27/2016   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   Muscle weakness (generalized) 09/17/2013   Scoliosis concern 09/17/2013   Stiffness of joint, not elsewhere classified, pelvic region and thigh 09/17/2013   Difficulty in walking(719.7) 09/17/2013    PCP: Asencion Noble, MD  REFERRING PROVIDER: Elba Barman, DO   REFERRING DIAG: [id number] (ICD-10-CM) - Greater trochanteric pain syndrome of right lower extremity   THERAPY DIAG:  Pain in right hip  Muscle weakness (generalized)  Other abnormalities of gait and mobility  Difficulty in walking, not elsewhere classified  Rationale for Evaluation and Treatment Rehabilitation  ONSET DATE: Early summer 2023  SUBJECTIVE:   SUBJECTIVE STATEMENT: Pt reports she has began back spasms, saw 2 MDs concerning LBP within 2 last week.  The first doctor gave her an injection into Rt side with reports of pain reduced.  Doctor gave her additional exercises, does have some questions concerning new exercise.  Reports hip feels a little better today, continues to have difficulty completing stairs.  Eval subjective: Patient developed gradual hip pain "over the bone" on right side this summer, seen by rheumatologist and other specialists. Had a couple different injections of which she cannot recall, most recently about 1 week ago. There was temporary relief. She has recently been starting to develop some back pain, she believes from compensating from the back  pain. Reports difficulty walking, especially stairs. Denies constitutional symptoms. No radicular symptoms reported.  PERTINENT HISTORY: See above  PAIN:  Are you having pain? Yes: NPRS scale: 4-5/10 Pain location: right hip Pain description: Sore Aggravating factors: walking, stairs, standing Relieving factors: lying flat, seated  PRECAUTIONS: None  WEIGHT BEARING RESTRICTIONS No  FALLS:  Has patient fallen in last 6  months? No  LIVING ENVIRONMENT: Lives with: lives with their family and lives with their spouse Lives in: House/apartment Stairs: Yes: Internal: 13 steps; on right going up and on left going up and External: 2 steps; on right going up, on left going up, and can reach both Has following equipment at home: None  OCCUPATION: Retired, hobbies - cook, yard work, reading  PLOF: Independent  PATIENT GOALS Decrease pain, improve walking, standing, and stair ability, stand long enough to cook   OBJECTIVE:   DIAGNOSTIC FINDINGS: n /a  PATIENT SURVEYS:  FOTO 44%  COGNITION:  Overall cognitive status: Within functional limits for tasks assessed     SENSATION: WFL  PALPATION: TTP greater trochanter TrPs noted Rt glute max and medius  LOWER EXTREMITY ROM:  Active ROM Right eval Left eval  Hip flexion    Hip extension    Hip abduction    Hip adduction    Hip internal rotation 36 31  Hip external rotation 29 36  Knee flexion    Knee extension    Ankle dorsiflexion    Ankle plantarflexion    Ankle inversion    Ankle eversion     (Blank rows = not tested)  LUMBAR ROM:  Active ROM Eval  Flexion Distal 1/3 shin (P! Rt hip)  Extension WFL  Right Flexion Joint line  Left Flexion Joint line   LOWER EXTREMITY MMT:  MMT Right eval Left eval  Hip flexion 4+ 4+  Hip extension 4 4  Hip abduction 4+ 4+  Hip adduction 4+ 4+  Hip internal rotation    Hip external rotation 3+ 3+  Knee flexion 4+ 5  Knee extension 5 5  Ankle dorsiflexion 5 5  Ankle plantarflexion    Ankle inversion    Ankle eversion     (Blank rows = not tested)  LOWER EXTREMITY SPECIAL TESTS:  Hip special tests: Saralyn Pilar (FABER) test: positive , Hip scouring test: positive , and Anterior hip impingement test: positive  (Right) Slump test + Rt (posterior thigh - not into hip) SLR + Rt  FUNCTIONAL TESTS:  5 times sit to stand: 13.2 sec  GAIT: Time limited    TODAY'S TREATMENT: Reviewed  goals Educated importance of HEP compliance for maxiaml benefits Reviewed current PT HEP as well as additional exercises added by doctors with main focus on postural strengthening and scapular mobility 2MWT 436f no AD 3D hip excursion 10x each (weight shifting, rotation, squat, lumbar extension) Manual to Rt hip with trigger point release technqiues to Rt piriformis and glut med    Eval  MMT ROM 5x sit to stand HEP  PATIENT EDUCATION:  Education details: findings HEP, PT role, POC, modalities as needed Person educated: Patient Education method: Explanation Education comprehension: verbalized understanding   HOME EXERCISE PROGRAM: Access Code: [email or web address] URL: [email or web address] Date: 10/23/2021 Prepared by: LCandie Mile Exercises - Supine Bridge  - 2 x daily - 7 x weekly - 2 sets - 10 reps - 5 seconds hold - Supine Transversus Abdominis Bracing - Hands on Stomach  - 3 x daily - 7 x weekly - 3 sets -  10 reps - Supine Piriformis Stretch with Foot on Ground  - 3 x daily - 7 x weekly - 3 sets - 15-30 hold - Supine Figure 4 Piriformis Stretch (Mirrored)  - 3 x daily - 7 x weekly - 3 sets - 15-30 sec hold  ASSESSMENT:  CLINICAL IMPRESSION: Reviewed goals, educated importance of HEP compliance, pt stated following injection was encouraged to hold on exercises.  The second MD she saw gave her additional exercises for hip and postural strengthening, pt with some questions regarding new exercises that was instructed this session with main focus on scapular mobility and postural strengthening.   2MWT complete with ability to ambulate 44f with good mechanics initially but noted increased antalgic gait mechanics with increased cadence and distance.  Added 3D hip excursion exercises for gluteal strengthening and hip mobility.  EOS with manual trigger point release to Rt hip focus on piriformis and glut med with reports of some relief.  Reviewed schedule and noted that no dry  needling therapist on schedule, encouraged pt to talk to front desk if wishes to add this modality in her sessions.  OBJECTIVE IMPAIRMENTS Abnormal gait, decreased activity tolerance, decreased knowledge of condition, decreased mobility, difficulty walking, decreased ROM, decreased strength, hypomobility, increased fascial restrictions, increased muscle spasms, impaired flexibility, improper body mechanics, and pain.   ACTIVITY LIMITATIONS carrying, lifting, bending, sitting, standing, squatting, sleeping, stairs, transfers, bed mobility, and locomotion level  PARTICIPATION LIMITATIONS: meal prep, cleaning, laundry, shopping, community activity, and yard work  PERSONAL FACTORS Age, Past/current experiences, and Time since onset of injury/illness/exacerbation are also affecting patient's functional outcome.   REHAB POTENTIAL: Good  CLINICAL DECISION MAKING: Stable/uncomplicated  EVALUATION COMPLEXITY: Low   GOALS: Goals reviewed with patient? Yes  SHORT TERM GOALS: Target date: 11/06/21  Patient will be independent with initial HEP and self-management strategies to improve functional outcomes Baseline: Initiated Goal status: IN PROGRESS    2. Eliminate myofascial trigger points and TTP of Rt glute and greater trochanter.   Baseline: TrPs in Rt glute max and med  Goal status: IN PROGRESS  LONG TERM GOALS: Target date: 11/20/21  Patient will be independent with advanced HEP and self-management strategies to improve functional outcomes Baseline:  Goal status: IN PROGRESS  2.  Patient will improve FOTO score to predicted of 59%value to indicate improvement in functional outcomes Baseline: 44 Goal status: IN PROGRESS  3.  Patient will report 75% improvement in symptoms to progress towards PLOF. Baseline:  Goal status: IN PROGRESS  4. Patient will have equal to or > 4+/5 MMT throughout BIL LEs to improve ability to perform functional mobility, stair ambulation and ADLs.   Baseline: See above Goal status: IN PROGRESS  5. Patient will improve 5 time sit to stand < 12.6 sec to demonstrate significant improvement in functional strength with transfers. Baseline: 13.2 sec Goal status: IN PROGRESS    PLAN: PT FREQUENCY: 2x/week  PT DURATION: 4 weeks  PLANNED INTERVENTIONS: Therapeutic exercises, Therapeutic activity, Neuromuscular re-education, Balance training, Gait training, Patient/Family education, Self Care, Joint mobilization, Joint manipulation, Stair training, DME instructions, Dry Needling, Electrical stimulation, Spinal manipulation, Spinal mobilization, Cryotherapy, Moist heat, Taping, Traction, Ultrasound, Biofeedback, Ionotophoresis '4mg'$ /ml Dexamethasone, Manual therapy, and Re-evaluation  PLAN FOR NEXT SESSION: Consider dry needling Rt glute max and Med. Progressive strengthening of Rt LE proximal muscles, and core. (Has some lumbar symptoms overlapping Rt hip)   CIhor Austin LPTA/CLT; CDelana [phone] 11/10/2021, 10:47 AM

## 2021-11-13 ENCOUNTER — Ambulatory Visit (HOSPITAL_COMMUNITY): Payer: Medicare HMO | Admitting: Physical Therapy

## 2021-11-14 ENCOUNTER — Ambulatory Visit: Payer: Medicare HMO | Admitting: Sports Medicine

## 2021-11-15 ENCOUNTER — Ambulatory Visit (HOSPITAL_COMMUNITY): Payer: Medicare HMO | Attending: Physician Assistant | Admitting: Physical Therapy

## 2021-11-15 DIAGNOSIS — M6281 Muscle weakness (generalized): Secondary | ICD-10-CM | POA: Diagnosis present

## 2021-11-15 DIAGNOSIS — R29898 Other symptoms and signs involving the musculoskeletal system: Secondary | ICD-10-CM | POA: Insufficient documentation

## 2021-11-15 DIAGNOSIS — M25551 Pain in right hip: Secondary | ICD-10-CM | POA: Insufficient documentation

## 2021-11-15 DIAGNOSIS — R262 Difficulty in walking, not elsewhere classified: Secondary | ICD-10-CM | POA: Insufficient documentation

## 2021-11-15 DIAGNOSIS — R2689 Other abnormalities of gait and mobility: Secondary | ICD-10-CM | POA: Insufficient documentation

## 2021-11-15 DIAGNOSIS — M545 Low back pain, unspecified: Secondary | ICD-10-CM | POA: Diagnosis present

## 2021-11-15 NOTE — Therapy (Signed)
OUTPATIENT PHYSICAL THERAPY LOWER EXTREMITY TREATMENT   Patient Name: Lauren Knox MRN: [id number] DOB:09/21/1945, 76 y.o., female Today's Date: 11/15/2021   PT End of Session - 11/15/21 1356     Visit Number 3    Number of Visits 8    Date for PT Re-Evaluation 11/20/21    Authorization Type [employer] Medicare    Authorization Time Period No auth, no VL    Progress Note Due on Visit 8    PT Start Time 1307    PT Stop Time 1347    PT Time Calculation (min) 40 min    Activity Tolerance Patient tolerated treatment well    Behavior During Therapy [facility] for tasks assessed/performed               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   Osteopenia    Raynaud's disease    Raynaud'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 01/27/2016   Rosacea 01/27/2016   Varicose veins    Vitamin D deficiency 01/27/2016   Past Surgical History:  Procedure Laterality Date   ABDOMINAL HYSTERECTOMY     CESAREAN SECTION     CHOLECYSTECTOMY     COLONOSCOPY  2023   COLONOSCOPY WITH PROPOFOL N/A 09/29/2021   Procedure: COLONOSCOPY WITH PROPOFOL;  Surgeon: Carol Ada, MD;  Location: [facility];  Service: Gastroenterology;  Laterality: N/A;   HEMOSTASIS CLIP PLACEMENT  09/29/2021   Procedure: HEMOSTASIS CLIP PLACEMENT;  Surgeon: Carol Ada, MD;  Location: [facility];  Service: Gastroenterology;;   INTESTINAL BLOCKAGE     POLYPECTOMY  09/29/2021   Procedure: POLYPECTOMY;  Surgeon: Carol Ada, MD;  Location: [facility];  Service: Gastroenterology;;   TONSILLECTOMY     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 01/27/2016   ADD (attention  deficit disorder) 01/27/2016   Rosacea 01/27/2016   Vitamin D deficiency 01/27/2016   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   Muscle weakness (generalized) 09/17/2013   Scoliosis concern 09/17/2013   Stiffness of joint, not elsewhere classified, pelvic region and thigh 09/17/2013   Difficulty in walking(719.7) 09/17/2013    PCP: Asencion Noble, MD  REFERRING PROVIDER: Elba Barman, DO   REFERRING DIAG: [phone] (ICD-10-CM) - Greater trochanteric pain syndrome of right lower extremity   THERAPY DIAG:  Pain in right hip  Muscle weakness (generalized)  Other abnormalities of gait and mobility  Difficulty in walking, not elsewhere classified  Other symptoms and signs involving the musculoskeletal system  Low back pain, unspecified back pain laterality, unspecified chronicity, unspecified whether sciatica present  Rationale for Evaluation and Treatment Rehabilitation  ONSET DATE: Early summer 2023  SUBJECTIVE:   SUBJECTIVE STATEMENT: Pt states that her hip is doing a little better but she continues to have some back pain.   PERTINENT HISTORY: See above  PAIN:  Are you having pain? Yes: NPRS scale: back pain 2/10, hip  4/10 Pain location: right hip Pain description: Sore Aggravating factors: walking, stairs, standing Relieving factors: lying flat, seated  PRECAUTIONS: None  WEIGHT BEARING RESTRICTIONS No  FALLS:  Has patient fallen in last 6 months? No  LIVING ENVIRONMENT: Lives with: lives with their family and lives with their spouse Lives in: House/apartment Stairs:  Yes: Internal: 13 steps; on right going up and on left going up and External: 2 steps; on right going up, on left going up, and can reach both Has following equipment at home: None  OCCUPATION: Retired, hobbies - cook, yard work, reading  PLOF: Independent  PATIENT GOALS Decrease pain, improve walking, standing, and stair ability, stand long enough to cook   OBJECTIVE:   DIAGNOSTIC FINDINGS: n  /a  PATIENT SURVEYS:  FOTO 44%  COGNITION:  Overall cognitive status: Within functional limits for tasks assessed     SENSATION: WFL  PALPATION: TTP greater trochanter TrPs noted Rt glute max and medius  LOWER EXTREMITY ROM:  Active ROM Right eval Left eval  Hip flexion    Hip extension    Hip abduction    Hip adduction    Hip internal rotation 36 31  Hip external rotation 29 36  Knee flexion    Knee extension    Ankle dorsiflexion    Ankle plantarflexion    Ankle inversion    Ankle eversion     (Blank rows = not tested)  LUMBAR ROM:  Active ROM Eval  Flexion Distal 1/3 shin (P! Rt hip)  Extension WFL  Right Flexion Joint line  Left Flexion Joint line   LOWER EXTREMITY MMT:  MMT Right eval Left eval  Hip flexion 4+ 4+  Hip extension 4 4  Hip abduction 4+ 4+  Hip adduction 4+ 4+  Hip internal rotation    Hip external rotation 3+ 3+  Knee flexion 4+ 5  Knee extension 5 5  Ankle dorsiflexion 5 5  Ankle plantarflexion    Ankle inversion    Ankle eversion     (Blank rows = not tested)  LOWER EXTREMITY SPECIAL TESTS:  Hip special tests: Saralyn Pilar (FABER) test: positive , Hip scouring test: positive , and Anterior hip impingement test: positive  (Right) Slump test + Rt (posterior thigh - not into hip) SLR + Rt  FUNCTIONAL TESTS:  5 times sit to stand: 13.2 sec  GAIT: Time limited    TODAY'S TREATMENT:                        11/17/21                           Supine:                        Active hamstring stretch 30" x 2                        Knee to chest 30" x 3                         Bridge x 15                        Lt side lying:                        Clam x 10                         Hip abduction x 10                         Prone:  POE x 1 minute                         Passive quad stretch                        Hip extension x 10                        Sitting:                        Sit to stand x  10                         11/10/2021                        Reviewed goals Educated importance of HEP compliance for maxiaml benefits Reviewed current PT HEP as well as additional exercises added by doctors with main focus on postural strengthening and scapular mobility 2MWT 437f no AD 3D hip excursion 10x each (weight shifting, rotation, squat, lumbar extension) Manual to Rt hip with trigger point release technqiues to Rt piriformis and glut med    Eval  MMT ROM 5x sit to stand HEP  PATIENT EDUCATION:  Education details: findings HEP, PT role, POC, modalities as needed Person educated: Patient Education method: Explanation Education comprehension: verbalized understanding   HOME EXERCISE PROGRAM: Access Code: [email or web address] URL: [email or web address] Date: 10/23/2021 Prepared by: LCandie Mile11/01/2021 - Supine Single Knee to Chest Stretch  - 2 x daily - 7 x weekly - 1 sets - 3 reps - 30" hold - Supine Hamstring Stretch  - 2 x daily - 7 x weekly - 1 sets - 3 reps - 30" hold - Clamshell  - 1 x daily - 7 x weekly - 1 sets - 10 reps - 3-5" hold - Sidelying Hip Abduction  - 1 x daily - 7 x weekly - 1 sets - 10 reps - 3-5"  hold - Prone Hip Extension - One Pillow  - 1 x daily - 7 x weekly - 3 sets - 10 reps Exercises - Supine Bridge  - 2 x daily - 7 x weekly - 2 sets - 10 reps - 5 seconds hold - Supine Transversus Abdominis Bracing - Hands on Stomach  - 3 x daily - 7 x weekly - 3 sets - 10 reps - Supine Piriformis Stretch with Foot on Ground  - 3 x daily - 7 x weekly - 3 sets - 15-30 hold - Supine Figure 4 Piriformis Stretch (Mirrored)  - 3 x daily - 7 x weekly - 3 sets - 15-30 sec hold  ASSESSMENT:  CLINICAL IMPRESSION: Pt seen for both strengthening and stretching exercises for RT hip.  Pt needs verbal cuing to stabilize prior to exercise.  Therapist updated HEP. PT progressing but will continue to benefit from skilled PT for strengthening, stretching and  balance.  OBJECTIVE IMPAIRMENTS Abnormal gait, decreased activity tolerance, decreased knowledge of condition, decreased mobility, difficulty walking, decreased ROM, decreased strength, hypomobility, increased fascial restrictions, increased muscle spasms, impaired flexibility, improper body mechanics, and pain.   ACTIVITY LIMITATIONS carrying, lifting, bending, sitting, standing, squatting, sleeping, stairs, transfers, bed mobility, and locomotion level  PARTICIPATION LIMITATIONS: meal prep, cleaning, laundry, shopping, community activity, and yard work  PERSONAL FACTORS Age, Past/current experiences, and Time since onset of  injury/illness/exacerbation are also affecting patient's functional outcome.   REHAB POTENTIAL: Good  CLINICAL DECISION MAKING: Stable/uncomplicated  EVALUATION COMPLEXITY: Low   GOALS: Goals reviewed with patient? Yes  SHORT TERM GOALS: Target date: 11/06/21  Patient will be independent with initial HEP and self-management strategies to improve functional outcomes Baseline: Initiated Goal status: IN PROGRESS    2. Eliminate myofascial trigger points and TTP of Rt glute and greater trochanter.   Baseline: TrPs in Rt glute max and med  Goal status: IN PROGRESS  LONG TERM GOALS: Target date: 11/20/21  Patient will be independent with advanced HEP and self-management strategies to improve functional outcomes Baseline:  Goal status: IN PROGRESS  2.  Patient will improve FOTO score to predicted of 59%value to indicate improvement in functional outcomes Baseline: 44 Goal status: IN PROGRESS  3.  Patient will report 75% improvement in symptoms to progress towards PLOF. Baseline:  Goal status: IN PROGRESS  4. Patient will have equal to or > 4+/5 MMT throughout BIL LEs to improve ability to perform functional mobility, stair ambulation and ADLs.  Baseline: See above Goal status: IN PROGRESS  5. Patient will improve 5 time sit to stand < 12.6 sec to  demonstrate significant improvement in functional strength with transfers. Baseline: 13.2 sec Goal status: IN PROGRESS    PLAN: PT FREQUENCY: 2x/week  PT DURATION: 4 weeks  PLANNED INTERVENTIONS: Therapeutic exercises, Therapeutic activity, Neuromuscular re-education, Balance training, Gait training, Patient/Family education, Self Care, Joint mobilization, Joint manipulation, Stair training, DME instructions, Dry Needling, Electrical stimulation, Spinal manipulation, Spinal mobilization, Cryotherapy, Moist heat, Taping, Traction, Ultrasound, Biofeedback, Ionotophoresis '4mg'$ /ml Dexamethasone, Manual therapy, and Re-evaluation  PLAN FOR NEXT SESSION:  begin single leg stance and side stepping.  Consider dry needling Rt glute max and Med. Progressive strengthening of Rt LE proximal muscles, and core. (Has some lumbar symptoms overlapping Rt hip)  Rayetta Humphrey, PT CLT [phone]  [phone]

## 2021-11-15 NOTE — Telephone Encounter (Signed)
Patient scheduled for Forteo new start on 11/16/21. She will bring her pen needles and one Forteo pen with her in cooler.  Knox Saliva, PharmD, MPH, BCPS, CPP Clinical Pharmacist (Rheumatology and Pulmonology)

## 2021-11-16 ENCOUNTER — Ambulatory Visit: Payer: Medicare HMO | Attending: Rheumatology | Admitting: Pharmacist

## 2021-11-16 DIAGNOSIS — Z7189 Other specified counseling: Secondary | ICD-10-CM

## 2021-11-16 DIAGNOSIS — M810 Age-related osteoporosis without current pathological fracture: Secondary | ICD-10-CM

## 2021-11-16 MED ORDER — FORTEO 600 MCG/2.4ML ~~LOC~~ SOPN: 20.0000 ug | PEN_INJECTOR | Freq: Every day | SUBCUTANEOUS | 0 refills | Status: DC

## 2021-11-16 NOTE — Progress Notes (Signed)
Pharmacy Note  Subjective:   Patient presents to clinic today to receive first dose of Forteo. She has never self-administered any injectable medication so is a little nervuos.  Previous treatment for osteoporosis includes Reclast x 3 infusions.  Objective: CMP     Component Value Date/Time   NA 139 11/01/2021 1133   NA 141 06/21/2015 0000   K 4.5 11/01/2021 1133   CL 102 11/01/2021 1133   CO2 27 11/01/2021 1133   GLUCOSE 100 (H) 11/01/2021 1133   BUN 20 11/01/2021 1133   CREATININE 0.88 11/01/2021 1133   CALCIUM 10.7 (H) 11/01/2021 1133   PROT 7.3 11/01/2021 1133   AST 17 11/01/2021 1133   ALT 8 11/01/2021 1133   BILITOT 0.8 11/01/2021 1133   GFRNONAA 58 (L) 11/27/2019 1515   GFRAA 67 11/27/2019 1515    CBC    Component Value Date/Time   WBC 9.9 11/01/2021 1133   RBC 4.76 11/01/2021 1133   HGB 15.4 11/01/2021 1133   HCT 44.0 11/01/2021 1133   PLT 471 (H) 11/01/2021 1133   MCV 92.4 11/01/2021 1133   MCH 32.4 11/01/2021 1133   MCHC 35.0 11/01/2021 1133   RDW 12.4 11/01/2021 1133   LYMPHSABS 2,237 11/01/2021 1133   EOSABS 149 11/01/2021 1133   BASOSABS 59 11/01/2021 1133   DEXA scan completed on 10/10/21 - no significant change in BMD (by 5%) Lowest T-score -2.6 at right femoral neck.  Assessment/Plan:  Counseled patient on purpose, proper use, storage, and adverse effects of Forteo. Discussed the Forteo is PTH peptide agonist which results in stimulation of osteoblast and bone mass. Reviewed that Forteo treatment course is for 2 years. Discussed that pen is stable for 28 days after first use and must be stored in fridge after each dose. Counseled patient that [place] is a medication that must be injected once daily and that prescription for pen needles will be sent with Forteo prescription.  Advised patient to continue taking calcium 1200 mg daily and vitamin D supplement.  Reviewed the most common adverse effects of Forteo including orthostatic hypotension, GI upset,  injection site reaction, and joint pain/arthralgia.  Discussed injection site reaction management and injection site locations. Discussed alternating injection site.  Discussed management of dizziness (which can occur for up to 4 hours after injection) including adequate hydration, slowly rising from bed/chairs to prevent fals, and taking Forteo at night if needed.  We walked through [employer] administration with demo pen and pen needle.  Discussed appropriate disposal of sharps.   Demonstrated proper injection technique with Forteo demo device and pen needles.  Patient able to demonstrate proper injection technique using the teach back method.  Patient self injected in the right lower abdomen.  Patient tolerated injection well.  She expressed some hesitation but was moderately more confident once practicing with sample pen needle on demo.  Forteo approved through patient assistance .   Rx sent to: [facility].  Patient has received 4 pens from pharmacy  Patient will continue Forteo 32mg SQ once daily. Injector pen is to be discarded after 28 days from first use. Patient will complete two years of therapy and then discuss transitioning to other osteoporosis therapy once treatment course is completed.  Next DEXA scan is due September 2025.  All questions encouraged and answered.  Instructed patient to call with any further questions or concerns.  DKnox Saliva PharmD, MPH, BCPS, CPP Clinical Pharmacist (Rheumatology and Pulmonology)  11/16/2021 10:41 AM

## 2021-11-17 ENCOUNTER — Ambulatory Visit (HOSPITAL_COMMUNITY): Payer: Medicare HMO | Admitting: Physical Therapy

## 2021-11-17 DIAGNOSIS — M25551 Pain in right hip: Secondary | ICD-10-CM | POA: Diagnosis not present

## 2021-11-17 DIAGNOSIS — R29898 Other symptoms and signs involving the musculoskeletal system: Secondary | ICD-10-CM

## 2021-11-17 DIAGNOSIS — R2689 Other abnormalities of gait and mobility: Secondary | ICD-10-CM

## 2021-11-17 DIAGNOSIS — M545 Low back pain, unspecified: Secondary | ICD-10-CM

## 2021-11-17 DIAGNOSIS — M6281 Muscle weakness (generalized): Secondary | ICD-10-CM

## 2021-11-17 DIAGNOSIS — R262 Difficulty in walking, not elsewhere classified: Secondary | ICD-10-CM

## 2021-11-17 NOTE — Therapy (Signed)
OUTPATIENT PHYSICAL THERAPY LOWER EXTREMITY TREATMENT   Patient Name: Lauren Knox MRN: [id number] DOB:1945/07/28, 76 y.o., female Today's Date: 11/17/2021   PT End of Session - 11/17/21 1027    Visit Number 4    Number of Visits 8    Date for PT Re-Evaluation 11/20/21    Authorization Type [employer]    Authorization Time Period No auth, no VL    Progress Note Due on Visit 8    PT Start Time [id number]    PT Stop Time 1027    PT Time Calculation (min) 38 min    Activity Tolerance Patient tolerated treatment well    Behavior During Therapy [facility] for tasks assessed/performed                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   Osteopenia    Raynaud's disease    Raynaud'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 01/27/2016   Rosacea 01/27/2016   Varicose veins    Vitamin D deficiency 01/27/2016   Past Surgical History:  Procedure Laterality Date   ABDOMINAL HYSTERECTOMY     CESAREAN SECTION     CHOLECYSTECTOMY     COLONOSCOPY  2023   COLONOSCOPY WITH PROPOFOL N/A 09/29/2021   Procedure: COLONOSCOPY WITH PROPOFOL;  Surgeon: Carol Ada, MD;  Location: [facility];  Service: Gastroenterology;  Laterality: N/A;   HEMOSTASIS CLIP PLACEMENT  09/29/2021   Procedure: HEMOSTASIS CLIP PLACEMENT;  Surgeon: Carol Ada, MD;  Location: [facility];  Service: Gastroenterology;;   INTESTINAL BLOCKAGE     POLYPECTOMY  09/29/2021   Procedure: POLYPECTOMY;  Surgeon: Carol Ada, MD;  Location: [facility];  Service: Gastroenterology;;   TONSILLECTOMY     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 01/27/2016   ADD (attention  deficit disorder) 01/27/2016   Rosacea 01/27/2016   Vitamin D deficiency 01/27/2016   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   Muscle weakness (generalized) 09/17/2013   Scoliosis concern 09/17/2013   Stiffness of joint, not elsewhere classified, pelvic region and thigh 09/17/2013   Difficulty in walking(719.7) 09/17/2013    PCP: Asencion Noble, MD  REFERRING PROVIDER: Elba Barman, DO   REFERRING DIAG: [phone] (ICD-10-CM) - Greater trochanteric pain syndrome of right lower extremity   THERAPY DIAG:  Pain in right hip  Muscle weakness (generalized)  Other abnormalities of gait and mobility  Difficulty in walking, not elsewhere classified  Other symptoms and signs involving the musculoskeletal system  Low back pain, unspecified back pain laterality, unspecified chronicity, unspecified whether sciatica present  Rationale for Evaluation and Treatment Rehabilitation  ONSET DATE: Early summer 2023  SUBJECTIVE:   SUBJECTIVE STATEMENT: Pt states she feels the pain the most when she goes up steps or if she has been walking for any length of time.   PERTINENT HISTORY: See above  PAIN:  Are you having pain? Yes: NPRS scale: back pain 2/10, hip  4/10 Pain location: right hip Pain description: Sore Aggravating factors: walking, stairs, standing Relieving factors: lying flat, seated  PRECAUTIONS: None  WEIGHT BEARING RESTRICTIONS No  FALLS:  Has patient fallen in last 6 months? No  LIVING ENVIRONMENT: Lives with: lives with their family and lives with  their spouse Lives in: House/apartment Stairs: Yes: Internal: 13 steps; on right going up and on left going up and External: 2 steps; on right going up, on left going up, and can reach both Has following equipment at home: None  OCCUPATION: Retired, hobbies - cook, yard work, reading  PLOF: Independent  PATIENT GOALS Decrease pain, improve walking, standing, and stair ability, stand long enough to cook   OBJECTIVE:    DIAGNOSTIC FINDINGS: n /a  PATIENT SURVEYS:  FOTO 44%  COGNITION:  Overall cognitive status: Within functional limits for tasks assessed     SENSATION: WFL  PALPATION: TTP greater trochanter TrPs noted Rt glute max and medius  LOWER EXTREMITY ROM:  Active ROM Right eval Left eval  Hip flexion    Hip extension    Hip abduction    Hip adduction    Hip internal rotation 36 31  Hip external rotation 29 36  Knee flexion    Knee extension    Ankle dorsiflexion    Ankle plantarflexion    Ankle inversion    Ankle eversion     (Blank rows = not tested)  LUMBAR ROM:  Active ROM Eval  Flexion Distal 1/3 shin (P! Rt hip)  Extension WFL  Right Flexion Joint line  Left Flexion Joint line   LOWER EXTREMITY MMT:  MMT Right eval Left eval  Hip flexion 4+ 4+  Hip extension 4 4  Hip abduction 4+ 4+  Hip adduction 4+ 4+  Hip internal rotation    Hip external rotation 3+ 3+  Knee flexion 4+ 5  Knee extension 5 5  Ankle dorsiflexion 5 5  Ankle plantarflexion    Ankle inversion    Ankle eversion     (Blank rows = not tested)  LOWER EXTREMITY SPECIAL TESTS:  Hip special tests: Saralyn Pilar (FABER) test: positive , Hip scouring test: positive , and Anterior hip impingement test: positive  (Right) Slump test + Rt (posterior thigh - not into hip) SLR + Rt  FUNCTIONAL TESTS:  5 times sit to stand: 13.2 sec  GAIT: Time limited    TODAY'S TREATMENT:                        11/17/21                           Standing:                          Step up 4" x 10 B                         Lateral step up 4" x 10 B                          Step down  2"x 10 B                         Side step with green theraband x 1 RT                          Functional squat x 10                         Heel raise x 10  Single leg stance B 3 reps RT:2"   LT:2"                            RT IT band stretch                          Theraband:                          Rows x 10                           Shld extension x 5                          Sitting:  piriformis stretch B 1' x 2                         Sit to stand x 10                                                  Supine:                        Active hamstring stretch 30" x 2                        Knee to chest 30" x 3                         Bridge x 15                        Lt side lying:                        Clam x 10                         Hip abduction x 10                         Prone:                        POE x 1 minute                         Passive quad stretch                        Hip extension x 10                        Sitting:                        Sit to stand x 10  PATIENT EDUCATION:  Education details: findings HEP, PT role, POC, modalities as needed Person educated: Patient Education method: Explanation Education comprehension: verbalized understanding   HOME EXERCISE PROGRAM: Access Code: [email or web address] URL: [email or web address] Date: 10/23/2021 Prepared by: Candie Mile  11/15/2021 - Supine Single Knee to Chest Stretch  - 2 x daily - 7 x weekly - 1 sets - 3 reps - 30" hold - Supine Hamstring Stretch  - 2 x daily - 7 x weekly - 1 sets - 3 reps - 30" hold - Clamshell  - 1 x daily - 7 x weekly - 1 sets - 10 reps - 3-5" hold - Sidelying Hip Abduction  - 1 x daily - 7 x weekly - 1 sets - 10 reps - 3-5"  hold - Prone Hip Extension - One Pillow  - 1 x daily - 7 x weekly - 3 sets - 10 reps Exercises - Supine Bridge  - 2 x daily - 7 x weekly - 2 sets - 10 reps - 5 seconds hold - Supine Transversus Abdominis Bracing - Hands on Stomach  - 3 x daily - 7 x weekly - 3 sets - 10 reps - Supine Piriformis Stretch with Foot on Ground  - 3 x daily - 7 x weekly - 3 sets - 15-30 hold - Supine Figure 4 Piriformis Stretch (Mirrored)  - 3 x daily - 7 x weekly - 3 sets - 15-30 sec hold  ASSESSMENT:  CLINICAL IMPRESSION: Advanced strengthening exercises to  standing completing exercises B due to Lt weakness as well.  Pt continues to needs verbal cuing to stabilize prior to exercise. Marland Kitchen PT progressing but will continue to benefit from skilled PT for strengthening, stretching and balance.  OBJECTIVE IMPAIRMENTS Abnormal gait, decreased activity tolerance, decreased knowledge of condition, decreased mobility, difficulty walking, decreased ROM, decreased strength, hypomobility, increased fascial restrictions, increased muscle spasms, impaired flexibility, improper body mechanics, and pain.   ACTIVITY LIMITATIONS carrying, lifting, bending, sitting, standing, squatting, sleeping, stairs, transfers, bed mobility, and locomotion level  PARTICIPATION LIMITATIONS: meal prep, cleaning, laundry, shopping, community activity, and yard work  PERSONAL FACTORS Age, Past/current experiences, and Time since onset of injury/illness/exacerbation are also affecting patient's functional outcome.   REHAB POTENTIAL: Good  CLINICAL DECISION MAKING: Stable/uncomplicated  EVALUATION COMPLEXITY: Low   GOALS: Goals reviewed with patient? Yes  SHORT TERM GOALS: Target date: 11/06/21  Patient will be independent with initial HEP and self-management strategies to improve functional outcomes Baseline: Initiated Goal status: IN PROGRESS    2. Eliminate myofascial trigger points and TTP of Rt glute and greater trochanter.   Baseline: TrPs in Rt glute max and med  Goal status: IN PROGRESS  LONG TERM GOALS: Target date: 11/20/21  Patient will be independent with advanced HEP and self-management strategies to improve functional outcomes Baseline:  Goal status: IN PROGRESS  2.  Patient will improve FOTO score to predicted of 59%value to indicate improvement in functional outcomes Baseline: 44 Goal status: IN PROGRESS  3.  Patient will report 75% improvement in symptoms to progress towards PLOF. Baseline:  Goal status: IN PROGRESS  4. Patient will have equal to or >  4+/5 MMT throughout BIL LEs to improve ability to perform functional mobility, stair ambulation and ADLs.  Baseline: See above Goal status: IN PROGRESS  5. Patient will improve 5 time sit to stand < 12.6 sec to demonstrate significant improvement in functional strength with transfers. Baseline: 13.2 sec Goal status: IN PROGRESS    PLAN: PT FREQUENCY: 2x/week  PT DURATION: 4 weeks  PLANNED INTERVENTIONS: Therapeutic exercises, Therapeutic activity, Neuromuscular re-education, Balance training, Gait training, Patient/Family education, Self Care, Joint mobilization, Joint manipulation, Stair training, DME instructions, Dry Needling, Electrical stimulation, Spinal manipulation, Spinal mobilization, Cryotherapy, Moist  heat, Taping, Traction, Ultrasound, Biofeedback, Ionotophoresis '4mg'$ /ml Dexamethasone, Manual therapy, and Re-evaluation  PLAN FOR NEXT SESSION:  update HEP.  Consider dry needling Rt glute max and Med. Progressive strengthening of Rt LE proximal muscles, and core. (Has some lumbar symptoms overlapping Rt hip)  Rayetta Humphrey, PT CLT [phone]  [phone]

## 2021-11-21 ENCOUNTER — Encounter (HOSPITAL_COMMUNITY): Payer: Medicare HMO

## 2021-11-21 ENCOUNTER — Encounter (HOSPITAL_COMMUNITY): Payer: Self-pay | Admitting: Physical Therapy

## 2021-11-21 ENCOUNTER — Ambulatory Visit (HOSPITAL_COMMUNITY): Payer: Medicare HMO | Admitting: Physical Therapy

## 2021-11-21 DIAGNOSIS — R2689 Other abnormalities of gait and mobility: Secondary | ICD-10-CM

## 2021-11-21 DIAGNOSIS — M25551 Pain in right hip: Secondary | ICD-10-CM | POA: Diagnosis not present

## 2021-11-21 DIAGNOSIS — R262 Difficulty in walking, not elsewhere classified: Secondary | ICD-10-CM

## 2021-11-21 DIAGNOSIS — M545 Low back pain, unspecified: Secondary | ICD-10-CM

## 2021-11-21 DIAGNOSIS — M6281 Muscle weakness (generalized): Secondary | ICD-10-CM

## 2021-11-21 DIAGNOSIS — R29898 Other symptoms and signs involving the musculoskeletal system: Secondary | ICD-10-CM

## 2021-11-21 NOTE — Therapy (Signed)
OUTPATIENT PHYSICAL THERAPY LOWER EXTREMITY TREATMENT   Patient Name: Lauren Knox MRN: [id number] DOB:[birth year], 76 y.o., female Today's Date: 11/21/2021   Progress Note Reporting Period 10/23/21 to 11/21/21  See note below for Objective Data and Assessment of Progress/Goals.    Request 4 more weeks at 2x/week to meet all goals. Significant progress made: see below    11/21/21 1308  PT Visits / Re-Eval  Visit Number 5  Number of Visits 16  Date for PT Re-Evaluation 12/19/21  Authorization  Authorization Type [employer]  Authorization Time Period No auth, no VL  Progress Note Due on Visit 8  PT Time Calculation  PT Start Time 1305  PT Stop Time 1349  PT Time Calculation (min) 44 min  PT - End of Session  Activity Tolerance Patient tolerated treatment well  Behavior During Therapy WFL for tasks assessed/performed        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   Osteopenia    Raynaud's disease    Raynaud'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 01/27/2016   Rosacea 01/27/2016   Varicose veins    Vitamin D deficiency 01/27/2016   Past Surgical History:  Procedure Laterality Date   ABDOMINAL HYSTERECTOMY     CESAREAN SECTION     CHOLECYSTECTOMY     COLONOSCOPY  2023   COLONOSCOPY WITH PROPOFOL N/A 09/29/2021   Procedure: COLONOSCOPY WITH PROPOFOL;  Surgeon: Carol Ada, MD;  Location: [facility];  Service: Gastroenterology;  Laterality: N/A;   HEMOSTASIS CLIP PLACEMENT  09/29/2021   Procedure: HEMOSTASIS CLIP PLACEMENT;  Surgeon: Carol Ada, MD;  Location: [facility];  Service: Gastroenterology;;   INTESTINAL BLOCKAGE     POLYPECTOMY  09/29/2021   Procedure: POLYPECTOMY;  Surgeon: Carol Ada, MD;  Location: [facility];  Service: Gastroenterology;;   TONSILLECTOMY      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   Muscle weakness (generalized) 09/17/2013   Scoliosis concern 09/17/2013   Stiffness of joint, not elsewhere classified, pelvic region and thigh 09/17/2013   Difficulty in walking(719.7) 09/17/2013    PCP: Asencion Noble, MD  REFERRING PROVIDER: Elba Barman, DO   REFERRING DIAG: [phone] (ICD-10-CM) - Greater trochanteric pain syndrome of right lower extremity   THERAPY DIAG:  Muscle weakness (generalized) - Plan: PT plan of care cert/re-cert  Other abnormalities of gait and mobility - Plan: PT plan of care cert/re-cert  Difficulty in walking, not elsewhere classified - Plan: PT plan of care cert/re-cert  Other symptoms and signs involving the musculoskeletal system - Plan: PT plan of care cert/re-cert  Low back pain, unspecified back pain laterality, unspecified chronicity, unspecified whether sciatica present - Plan: PT plan of care cert/re-cert  Rationale for Evaluation and Treatment Rehabilitation  ONSET DATE: Early summer 2023  SUBJECTIVE:   SUBJECTIVE STATEMENT: Patient reports feeling better overall. Still having a little bit of trouble navigating stairs, but feels much better about this. She is becoming more active.   PERTINENT HISTORY: See above  PAIN:  Are you having pain? Yes: NPRS scale: back pain  3-4/10, hip  3/10 Pain location: right hip Pain description: Sore Aggravating factors: walking, stairs, standing Relieving factors: lying flat, seated  PRECAUTIONS: None  WEIGHT BEARING RESTRICTIONS No  FALLS:  Has patient fallen in last 6 months? No  LIVING ENVIRONMENT: Lives with: lives with  their family and lives with their spouse Lives in: House/apartment Stairs: Yes: Internal: 13 steps; on right going up and on left going up and External: 2 steps; on right going up, on left going up, and can reach both Has following equipment at home: None  OCCUPATION: Retired, hobbies - cook, yard work, reading  PLOF: Independent  PATIENT GOALS Decrease pain, improve walking, standing, and stair ability, stand long enough to cook   OBJECTIVE:   DIAGNOSTIC FINDINGS: n /a  PATIENT SURVEYS:  FOTO 44%  COGNITION:  Overall cognitive status: Within functional limits for tasks assessed     SENSATION: WFL  PALPATION: TTP greater trochanter TrPs noted Rt glute max and medius  LOWER EXTREMITY ROM:  Active ROM Right eval Left eval  Hip flexion    Hip extension    Hip abduction    Hip adduction    Hip internal rotation 36 31  Hip external rotation 29 36  Knee flexion    Knee extension    Ankle dorsiflexion    Ankle plantarflexion    Ankle inversion    Ankle eversion     (Blank rows = not tested)  LUMBAR ROM:  Active ROM Eval  Flexion Distal 1/3 shin (P! Rt hip)  Extension WFL  Right Flexion Joint line  Left Flexion Joint line   LOWER EXTREMITY MMT:  MMT Right eval Left eval  Hip flexion 5 5  Hip extension 5 5  Hip abduction 5 5  Hip adduction 5 5  Hip internal rotation    Hip external rotation 4+ 4  Knee flexion 5 5  Knee extension 5 5  Ankle dorsiflexion 5 5  Ankle plantarflexion    Ankle inversion    Ankle eversion     (Blank rows = not tested)  LOWER EXTREMITY SPECIAL TESTS:  Hip special tests: Saralyn Pilar (FABER) test: positive , Hip scouring test: positive , and Anterior hip impingement test: positive  (Right) Slump test + Rt (calf) SLR + Rt  FUNCTIONAL TESTS:  5 times sit to stand: 13.2 sec  11/7 - 8 sec  GAIT: Time limited    TODAY'S TREATMENT: 11/21/21 Reassessment                                 Supine:                        Active  hamstring stretch 30" x 2                        Knee to chest 30" x 3                         Bridge x 15                        Lt side lying:                        Clam 2x 10  Prone:                        POE x 10                         Passive quad stretch x30" ea                        Hip extension x 10                        Sitting:                        Sit to stand x 10                         11/17/21                           Standing:                          Step up 4" x 10 B                         Lateral step up 4" x 10 B                          Step down  2"x 10 B                         Side step with green theraband x 1 RT                          Functional squat x 10                         Heel raise x 10                          Single leg stance B 3 reps RT:2"   LT:2"                            RT IT band stretch                          Theraband:                         Rows x 10                           Shld extension x 5                          Sitting:  piriformis stretch B 1' x 2                         Sit to stand x 10  Supine:                        Active hamstring stretch 30" x 2                        Knee to chest 30" x 3                         Bridge x 15                        Lt side lying:                        Clam x 10                         Hip abduction x 10                         Prone:                        POE x 1 minute                         Passive quad stretch                        Hip extension x 10                        Sitting:                        Sit to stand x 10  PATIENT EDUCATION:  Education details: findings HEP, PT role, POC, modalities as needed Person educated: Patient Education method: Explanation Education comprehension: verbalized understanding   HOME EXERCISE PROGRAM: Access Code: [email or web address] URL:  [email or web address] Date: 10/23/2021 Prepared by: Candie Mile 11/15/2021 - Supine Single Knee to Chest Stretch  - 2 x daily - 7 x weekly - 1 sets - 3 reps - 30" hold - Supine Hamstring Stretch  - 2 x daily - 7 x weekly - 1 sets - 3 reps - 30" hold - Clamshell  - 1 x daily - 7 x weekly - 1 sets - 10 reps - 3-5" hold - Sidelying Hip Abduction  - 1 x daily - 7 x weekly - 1 sets - 10 reps - 3-5"  hold - Prone Hip Extension - One Pillow  - 1 x daily - 7 x weekly - 3 sets - 10 reps Exercises - Supine Bridge  - 2 x daily - 7 x weekly - 2 sets - 10 reps - 5 seconds hold - Supine Transversus Abdominis Bracing - Hands on Stomach  - 3 x daily - 7 x weekly - 3 sets - 10 reps - Supine Piriformis Stretch with Foot on Ground  - 3 x daily - 7 x weekly - 3 sets - 15-30 hold - Supine Figure 4 Piriformis Stretch (Mirrored)  - 3 x daily - 7 x weekly - 3 sets - 15-30 sec hold  ASSESSMENT:  CLINICAL IMPRESSION: Excellent progress towards functional goals. Significant improvement in strength,  5 time sit to stand, and subjective improvement of >75%, meeting multiple goals. Still  short of FOTO score, and MMT established at initial visit but certainly capable of improving further with guided rehab. Patient will continue to benefit from skilled physical therapy services to further improve independence with functional mobility. Demonstrates good form with exercises performed today. Encourage continued core and LE strengthening, stair training, and Dry needling for TrPs.  OBJECTIVE IMPAIRMENTS Abnormal gait, decreased activity tolerance, decreased knowledge of condition, decreased mobility, difficulty walking, decreased ROM, decreased strength, hypomobility, increased fascial restrictions, increased muscle spasms, impaired flexibility, improper body mechanics, and pain.   ACTIVITY LIMITATIONS carrying, lifting, bending, sitting, standing, squatting, sleeping, stairs, transfers, bed mobility, and  locomotion level  PARTICIPATION LIMITATIONS: meal prep, cleaning, laundry, shopping, community activity, and yard work  PERSONAL FACTORS Age, Past/current experiences, and Time since onset of injury/illness/exacerbation are also affecting patient's functional outcome.   REHAB POTENTIAL: Good  CLINICAL DECISION MAKING: Stable/uncomplicated  EVALUATION COMPLEXITY: Low   GOALS: Goals reviewed with patient? Yes  SHORT TERM GOALS: Target date: 11/06/21  Patient will be independent with initial HEP and self-management strategies to improve functional outcomes Baseline: Initiated Goal status: MET  2. Eliminate myofascial trigger points and TTP of Rt glute and greater trochanter.   Baseline: TrPs in Rt glute max and med; minimal  Goal status: IN PROGRESS  LONG TERM GOALS: Target date: 11/20/21  Patient will be independent with advanced HEP and self-management strategies to improve functional outcomes Baseline: Completed short term HEP, advanced to More advanced program. Goal status: IN PROGRESS  2.  Patient will improve FOTO score to predicted of 59%value to indicate improvement in functional outcomes Baseline: 44; (11/7 - 46) Goal status: IN PROGRESS  3.  Patient will report 75% improvement in symptoms to progress towards PLOF. Baseline: 85% Goal status: Goal MET  4. Patient will have equal to or > 4+/5 MMT throughout BIL LEs to improve ability to perform functional mobility, stair ambulation and ADLs.  Baseline: See above (hip ER still shy of goal) Goal status: IN PROGRESS  5. Patient will improve 5 time sit to stand < 12.6 sec to demonstrate significant improvement in functional strength with transfers. Baseline: 13.2 sec (11/7 8.6 sec no hands) Goal status: MET   PLAN: PT FREQUENCY: 2x/week  PT DURATION: 4 weeks  PLANNED INTERVENTIONS: Therapeutic exercises, Therapeutic activity, Neuromuscular re-education, Balance training, Gait training, Patient/Family education,  Self Care, Joint mobilization, Joint manipulation, Stair training, DME instructions, Dry Needling, Electrical stimulation, Spinal manipulation, Spinal mobilization, Cryotherapy, Moist heat, Taping, Traction, Ultrasound, Biofeedback, Ionotophoresis 60m/ml Dexamethasone, Manual therapy, and Re-evaluation  PLAN FOR NEXT SESSION:  update HEP.  Consider dry needling Rt glute max and Med. Progressive strengthening of Rt LE proximal muscles, and core. (Has some lumbar symptoms overlapping Rt hip)  LCandie Mile PT, DPT Physical Therapist [facility]

## 2021-11-23 ENCOUNTER — Ambulatory Visit (HOSPITAL_COMMUNITY): Payer: Medicare HMO | Admitting: Physical Therapy

## 2021-11-23 DIAGNOSIS — R262 Difficulty in walking, not elsewhere classified: Secondary | ICD-10-CM

## 2021-11-23 DIAGNOSIS — R2689 Other abnormalities of gait and mobility: Secondary | ICD-10-CM

## 2021-11-23 DIAGNOSIS — M6281 Muscle weakness (generalized): Secondary | ICD-10-CM

## 2021-11-23 DIAGNOSIS — M25551 Pain in right hip: Secondary | ICD-10-CM | POA: Diagnosis not present

## 2021-11-23 NOTE — Therapy (Signed)
OUTPATIENT PHYSICAL THERAPY LOWER EXTREMITY TREATMENT   Patient Name: Lauren Knox MRN: [id number] DOB:August 31, 1945, 76 y.o., female Today's Date: 11/23/2021  END OF SESSION:   PT End of Session - 11/23/21 0959     Visit Number 6    Number of Visits 16    Date for PT Re-Evaluation 12/19/21    Authorization Type [employer]    Authorization Time Period No auth, no VL    Progress Note Due on Visit 8    PT Start Time 0951    PT Stop Time 1030    PT Time Calculation (min) 39 min    Activity Tolerance Patient tolerated treatment well    Behavior During Therapy [facility] for tasks assessed/performed             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   Osteopenia    Raynaud's disease    Raynaud'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 01/27/2016   Rosacea 01/27/2016   Varicose veins    Vitamin D deficiency 01/27/2016   Past Surgical History:  Procedure Laterality Date   ABDOMINAL HYSTERECTOMY     CESAREAN SECTION     CHOLECYSTECTOMY     COLONOSCOPY  2023   COLONOSCOPY WITH PROPOFOL N/A 09/29/2021   Procedure: COLONOSCOPY WITH PROPOFOL;  Surgeon: Carol Ada, MD;  Location: [facility];  Service: Gastroenterology;  Laterality: N/A;   HEMOSTASIS CLIP PLACEMENT  09/29/2021   Procedure: HEMOSTASIS CLIP PLACEMENT;  Surgeon: Carol Ada, MD;  Location: [facility];  Service: Gastroenterology;;   INTESTINAL BLOCKAGE     POLYPECTOMY  09/29/2021   Procedure: POLYPECTOMY;  Surgeon: Carol Ada, MD;  Location: [facility];  Service: Gastroenterology;;   TONSILLECTOMY     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 01/27/2016    ADD (attention deficit disorder) 01/27/2016   Rosacea 01/27/2016   Vitamin D deficiency 01/27/2016   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   Muscle weakness (generalized) 09/17/2013   Scoliosis concern 09/17/2013   Stiffness of joint, not elsewhere classified, pelvic region and thigh 09/17/2013   Difficulty in walking(719.7) 09/17/2013    PCP: Asencion Noble, MD  REFERRING PROVIDER: Elba Barman, DO   REFERRING DIAG: [phone] (ICD-10-CM) - Greater trochanteric pain syndrome of right lower extremity   THERAPY DIAG:  Muscle weakness (generalized)  Other abnormalities of gait and mobility  Difficulty in walking, not elsewhere classified  Rationale for Evaluation and Treatment Rehabilitation  ONSET DATE: Early summer 2023  SUBJECTIVE:   SUBJECTIVE STATEMENT: Patient reports her pain comes and goes.  Trying to lead with Rt side with stairs to help improve strength.  Pt with questions regarding her UE issues and activity.   PERTINENT HISTORY: See above  PAIN:  Are you having pain? Yes: NPRS scale: back pain 3-4/10, hip  3/10 Pain location: right hip Pain description: Sore Aggravating factors: walking, stairs, standing Relieving factors: lying flat, seated  PRECAUTIONS: None  WEIGHT BEARING RESTRICTIONS No  FALLS:  Has patient fallen in last 6 months? No  LIVING ENVIRONMENT: Lives with: lives with their family and lives with their spouse Lives in: House/apartment Stairs: Yes: Internal: 13 steps; on right going up and on left going up and  External: 2 steps; on right going up, on left going up, and can reach both Has following equipment at home: None  OCCUPATION: Retired, hobbies - cook, yard work, reading  PLOF: Independent  PATIENT GOALS Decrease pain, improve walking, standing, and stair ability, stand long enough to cook   OBJECTIVE:   DIAGNOSTIC FINDINGS: n /a  PATIENT SURVEYS:  FOTO 44%  COGNITION:  Overall cognitive status: Within functional limits for tasks  assessed     SENSATION: WFL  PALPATION: TTP greater trochanter TrPs noted Rt glute max and medius  LOWER EXTREMITY ROM:  Active ROM Right eval Left eval  Hip flexion    Hip extension    Hip abduction    Hip adduction    Hip internal rotation 36 31  Hip external rotation 29 36  Knee flexion    Knee extension    Ankle dorsiflexion    Ankle plantarflexion    Ankle inversion    Ankle eversion     (Blank rows = not tested)  LUMBAR ROM:  Active ROM Eval  Flexion Distal 1/3 shin (P! Rt hip)  Extension WFL  Right Flexion Joint line  Left Flexion Joint line   LOWER EXTREMITY MMT:  MMT Right eval Left eval  Hip flexion 5 5  Hip extension 5 5  Hip abduction 5 5  Hip adduction 5 5  Hip internal rotation    Hip external rotation 4+ 4  Knee flexion 5 5  Knee extension 5 5  Ankle dorsiflexion 5 5  Ankle plantarflexion    Ankle inversion    Ankle eversion     (Blank rows = not tested)  LOWER EXTREMITY SPECIAL TESTS:  Hip special tests: Saralyn Pilar (FABER) test: positive , Hip scouring test: positive , and Anterior hip impingement test: positive  (Right) Slump test + Rt (calf) SLR + Rt  FUNCTIONAL TESTS:  5 times sit to stand: 13.2 sec  11/7 - 8 sec  GAIT: Time limited    TODAY'S TREATMENT:  11/23/21  Standing:  Heelraises 20X   Hip abduction 2X10 each   Hip extension 2X10 each   Vectors 10X3" holds each LE   Squats 10X   ITB stretch 30"  11/21/21 Reassessment                                 Supine:                        Active hamstring stretch 30" x 2                        Knee to chest 30" x 3                         Bridge x 15                        Lt side lying:                        Clam 2x 10                         Prone:                        POE  x 10                         Passive quad stretch x30" ea                        Hip extension x 10                        Sitting:                        Sit to stand x 10                          11/17/21                           Standing:                          Step up 4" x 10 B                         Lateral step up 4" x 10 B                          Step down  2"x 10 B                         Side step with green theraband x 1 RT                          Functional squat x 10                         Heel raise x 10                          Single leg stance B 3 reps RT:2"   LT:2"                            RT IT band stretch                          Theraband:                         Rows x 10                           Shld extension x 5                          Sitting:  piriformis stretch B 1' x 2                         Sit to stand x 10  Supine:                        Active hamstring stretch 30" x 2                        Knee to chest 30" x 3                         Bridge x 15                        Lt side lying:                        Clam x 10                         Hip abduction x 10                         Prone:                        POE x 1 minute                         Passive quad stretch                        Hip extension x 10                        Sitting:                        Sit to stand x 10  PATIENT EDUCATION:  Education details: findings HEP, PT role, POC, modalities as needed Person educated: Patient Education method: Explanation Education comprehension: verbalized understanding   HOME EXERCISE PROGRAM: Access Code: [email or web address] URL: [email or web address] Date: 10/23/2021 Prepared by: Candie Mile 11/15/2021 - Supine Single Knee to Chest Stretch  - 2 x daily - 7 x weekly - 1 sets - 3 reps - 30" hold - Supine Hamstring Stretch  - 2 x daily - 7 x weekly - 1 sets - 3 reps - 30" hold - Clamshell  - 1 x daily - 7 x weekly - 1 sets - 10 reps - 3-5" hold - Sidelying Hip Abduction  - 1 x daily - 7 x weekly - 1 sets - 10 reps - 3-5"  hold - Prone Hip Extension - One  Pillow  - 1 x daily - 7 x weekly - 3 sets - 10 reps Exercises - Supine Bridge  - 2 x daily - 7 x weekly - 2 sets - 10 reps - 5 seconds hold - Supine Transversus Abdominis Bracing - Hands on Stomach  - 3 x daily - 7 x weekly - 3 sets - 10 reps - Supine Piriformis Stretch with Foot on Ground  - 3 x daily - 7 x weekly - 3 sets - 15-30 hold - Supine Figure 4 Piriformis Stretch (Mirrored)  - 3 x daily - 7 x weekly - 3 sets - 15-30 sec hold  Access Code: [email or web address] URL: [email or web address] Date: 11/23/2021 Prepared by: Roseanne Reno Exercises - ITB Stretch at [facility])  - 2 x daily - 7 x weekly -  1 sets - 3 reps - 30 sec hold  ASSESSMENT:  CLINICAL IMPRESSION: Pt with several questions this session.  Able to answer most of these with some deferred to MD.  Suggested she request an OT evaluation for her elbow and shoulder issues.  Pt verbalized understanding.  Continued on with focus on glute strengthening.  Pt with cues for increased hold times, postural stability and general form while completing therex.  PT without any pain or issues during session. Added standing ITB stretch as most of her issues appear to be her lateral LE. Pt reported effectiveness with this session.  Patient will continue to benefit from skilled physical therapy services to further improve independence with functional mobility. Progress LE strengthening, stair training, and trial of dry needling for TrPs.  OBJECTIVE IMPAIRMENTS Abnormal gait, decreased activity tolerance, decreased knowledge of condition, decreased mobility, difficulty walking, decreased ROM, decreased strength, hypomobility, increased fascial restrictions, increased muscle spasms, impaired flexibility, improper body mechanics, and pain.   ACTIVITY LIMITATIONS carrying, lifting, bending, sitting, standing, squatting, sleeping, stairs, transfers, bed mobility, and locomotion level  PARTICIPATION LIMITATIONS: meal prep, cleaning, laundry,  shopping, community activity, and yard work  PERSONAL FACTORS Age, Past/current experiences, and Time since onset of injury/illness/exacerbation are also affecting patient's functional outcome.   REHAB POTENTIAL: Good  CLINICAL DECISION MAKING: Stable/uncomplicated  EVALUATION COMPLEXITY: Low   GOALS: Goals reviewed with patient? Yes  SHORT TERM GOALS: Target date: 11/06/21  Patient will be independent with initial HEP and self-management strategies to improve functional outcomes Baseline: Initiated Goal status: MET  2. Eliminate myofascial trigger points and TTP of Rt glute and greater trochanter.   Baseline: TrPs in Rt glute max and med; minimal  Goal status: IN PROGRESS  LONG TERM GOALS: Target date: 11/20/21  Patient will be independent with advanced HEP and self-management strategies to improve functional outcomes Baseline: Completed short term HEP, advanced to More advanced program. Goal status: IN PROGRESS  2.  Patient will improve FOTO score to predicted of 59%value to indicate improvement in functional outcomes Baseline: 44; (11/7 - 46) Goal status: IN PROGRESS  3.  Patient will report 75% improvement in symptoms to progress towards PLOF. Baseline: 85% Goal status: Goal MET  4. Patient will have equal to or > 4+/5 MMT throughout BIL LEs to improve ability to perform functional mobility, stair ambulation and ADLs.  Baseline: See above (hip ER still shy of goal) Goal status: IN PROGRESS  5. Patient will improve 5 time sit to stand < 12.6 sec to demonstrate significant improvement in functional strength with transfers. Baseline: 13.2 sec (11/7 8.6 sec no hands) Goal status: MET   PLAN: PT FREQUENCY: 2x/week  PT DURATION: 4 weeks  PLANNED INTERVENTIONS: Therapeutic exercises, Therapeutic activity, Neuromuscular re-education, Balance training, Gait training, Patient/Family education, Self Care, Joint mobilization, Joint manipulation, Stair training, DME  instructions, Dry Needling, Electrical stimulation, Spinal manipulation, Spinal mobilization, Cryotherapy, Moist heat, Taping, Traction, Ultrasound, Biofeedback, Ionotophoresis 66m/ml Dexamethasone, Manual therapy, and Re-evaluation  PLAN FOR NEXT SESSION:  update HEP.  To have dry needling appt next visit ( Rt glute max and Med). Progressive strengthening of Rt LE proximal muscles, and core. (Has some lumbar symptoms overlapping Rt hip)   ATeena Irani PTA/CLT COkanoganPh: [phone] FTeena Irani PTA 11/23/2021, 10:42 AM

## 2021-11-24 ENCOUNTER — Encounter: Payer: Self-pay | Admitting: Sports Medicine

## 2021-11-24 ENCOUNTER — Ambulatory Visit: Payer: Medicare HMO | Admitting: Sports Medicine

## 2021-11-24 DIAGNOSIS — R2689 Other abnormalities of gait and mobility: Secondary | ICD-10-CM

## 2021-11-24 DIAGNOSIS — M5134 Other intervertebral disc degeneration, thoracic region: Secondary | ICD-10-CM

## 2021-11-24 DIAGNOSIS — M25521 Pain in right elbow: Secondary | ICD-10-CM

## 2021-11-24 DIAGNOSIS — M25551 Pain in right hip: Secondary | ICD-10-CM

## 2021-11-24 DIAGNOSIS — M546 Pain in thoracic spine: Secondary | ICD-10-CM

## 2021-11-24 MED ORDER — NAPROXEN 500 MG PO TABS: 500.0000 mg | ORAL_TABLET | Freq: Two times a day (BID) | ORAL | 0 refills | Status: DC | PRN

## 2021-11-24 NOTE — Progress Notes (Signed)
Lauren Knox - 76 y.o. female MRN [id number]  Date of birth: [birth year]  Office Visit Note: Visit Date: 11/24/2021 PCP: Asencion Noble, MD Referred by: Asencion Noble, MD  Subjective: Chief Complaint  Patient presents with   Middle Back - Follow-up   HPI: Lauren Knox is a pleasant 76 y.o. female who presents today for follow-up of thoracic back pain, right lateral hip pain.   Has done 6 out of 12 scheduled physical therapy visits.  Both she and the therapist think she is making progress in her strength as well as pain reduction. She also saw Dr. [name] for OMT last month - found this to be beneficial.  She does have an upcoming appointment on the 17th for a second session.  In general, she thinks the back pain is doing much better.  Not getting the spasming like she had in the past.  Her right hip pain is also markedly improved.  She no longer has any pain on the outer lateral side of the hip.  Occasionally with certain activities such as gardening here recently when she bends forward she will get a pain in the ribs in the mid part of the back.  Still has her Robaxin to take just as needed.  Denies any radicular symptoms.  She is working on balance with physical therapy as well.  Pertinent ROS were reviewed with the patient and found to be negative unless otherwise specified above in HPI.   Assessment & Plan: Visit Diagnoses:  1. Pain in thoracic spine   2. Greater trochanteric pain syndrome of right lower extremity   3. Impairment of balance   4. DDD (degenerative disc disease), thoracic   5. Pain in right elbow    Plan: Discussed with Birda that we are seeing benefits from her continued formal physical therapy as well as home exercises.  She has had significant improvement in the spasming happening in her back.  She will continue to work on postural changes and strengthening of the back.  She will continue balance exercises at physical therapy as well.  Did review her  hip abduction series the balance the hip abductors and greater trochanteric areas of both hips.  I think her right elbow pain is likely an overuse from her using the [place], she will do activity modification at home and PT.  I did prescribe her Naprosyn for her to take only as needed to help with additional breakthrough pain.  We will let her complete formalized physical therapy and then she will follow back up.  We did discuss today she may benefit from maintenance doing more of tai chi or yoga based therapy.  Can always consider doing a referral to [facility] so she can do this safely before transition to a home program.   Follow-up: Return for f/u with Dr. Rolena Infante after completion of PT.   Meds & Orders:  Meds ordered this encounter  Medications   naproxen (NAPROSYN) 500 MG tablet    Sig: Take 1 tablet (500 mg total) by mouth 2 (two) times daily as needed for mild pain or moderate pain.    Dispense:  30 tablet    Refill:  0   No orders of the defined types were placed in this encounter.    Procedures: No procedures performed      Clinical History: No specialty comments available.  She reports that she has never smoked. She has been exposed to tobacco smoke. She has never used smokeless tobacco.  No results for input(s): "HGBA1C", "LABURIC" in the last 8760 hours.  Objective:   Vital Signs: There were no vitals taken for this visit.  Physical Exam  Gen: Well-appearing, in no acute distress; non-toxic CV: Regular Rate. Well-perfused. Warm.  Resp: Breathing unlabored on room air; no wheezing. Psych: Fluid speech in conversation; appropriate affect; normal thought process Neuro: Sensation intact throughout. No gross coordination deficits.   Ortho Exam - Thoracic spine: There is a thoracic kyphosis noted, although overall posture slightly improved.  No midline spinous process TTP.  Full range of motion in flexion, slightly limited in extension.  Full range of motion of upper  extremities.  - Right hip: There is no longer any TTP over the greater trochanteric region.  She has improved strength with hip abduction, only slightly weaker compared to the contralateral side.  Nontender today.  There is no blocking to internal and external passive rotation.  NVI.  Imaging: XR T-spine 11/07/21: 2 views of the thoracic spine including AP and lateral films were ordered  and reviewed by myself.  Radiographs demonstrate no significant scoliosis,  although there is at least moderate kyphosis of the thoracic spine.  There  is anterior bridging with osteophytes of T9-T10 and T10-T11.  No evidence  of compression fracture or acute fracture.   Past Medical/Family/Surgical/Social History: Medications & Allergies reviewed per EMR, new medications updated. 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   Muscle weakness (generalized) 09/17/2013   Scoliosis concern 09/17/2013   Stiffness of joint, not elsewhere classified, pelvic region and thigh 09/17/2013   Difficulty in walking(719.7) 09/17/2013   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   Osteopenia    Raynaud's disease    Raynaud'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 01/27/2016   Rosacea 01/27/2016   Varicose veins    Vitamin D deficiency 01/27/2016   Family History  Problem Relation Age of Onset   Cancer Mother    Cancer Father    Heart attack Father    Breast cancer Sister    Cancer Brother    Past Surgical History:   Procedure Laterality Date   ABDOMINAL HYSTERECTOMY     CESAREAN SECTION     CHOLECYSTECTOMY     COLONOSCOPY  2023   COLONOSCOPY WITH PROPOFOL N/A 09/29/2021   Procedure: COLONOSCOPY WITH PROPOFOL;  Surgeon: Carol Ada, MD;  Location: [facility];  Service: Gastroenterology;  Laterality: N/A;   HEMOSTASIS CLIP PLACEMENT  09/29/2021   Procedure: HEMOSTASIS CLIP PLACEMENT;  Surgeon: Carol Ada, MD;  Location: [facility];  Service: Gastroenterology;;   INTESTINAL BLOCKAGE     POLYPECTOMY  09/29/2021   Procedure: POLYPECTOMY;  Surgeon: Carol Ada, MD;  Location: [facility];  Service: Gastroenterology;;   TONSILLECTOMY     Social History   Occupational History   Not on file  Tobacco Use   Smoking status: Never    Passive exposure: Past   Smokeless tobacco: Never  Vaping Use   Vaping Use: Never used  Substance and Sexual Activity   Alcohol use: Yes  Alcohol/week: 4.0 standard drinks of alcohol    Types: 4 Glasses of wine per week    Comment: occ   Drug use: No   Sexual activity: Not on file

## 2021-11-24 NOTE — Progress Notes (Signed)
Doing better today; is doing therapy at least 2x week when schedule permits Feels some improvement with this

## 2021-11-27 ENCOUNTER — Encounter (HOSPITAL_COMMUNITY): Payer: Medicare HMO | Admitting: Physical Therapy

## 2021-11-27 ENCOUNTER — Ambulatory Visit (HOSPITAL_COMMUNITY): Payer: Medicare HMO | Admitting: Physical Therapy

## 2021-11-27 DIAGNOSIS — M6281 Muscle weakness (generalized): Secondary | ICD-10-CM

## 2021-11-27 DIAGNOSIS — M25551 Pain in right hip: Secondary | ICD-10-CM | POA: Diagnosis not present

## 2021-11-27 NOTE — Therapy (Signed)
OUTPATIENT PHYSICAL THERAPY LOWER EXTREMITY TREATMENT   Patient Name: SAMMIE SCHERMERHORN MRN: [id number] DOB:04-22-1945, 76 y.o., female Today's Date: 11/27/2021  END OF SESSION:   PT End of Session - 11/27/21 0956     Visit Number 7    Number of Visits 16    Date for PT Re-Evaluation 12/19/21    Authorization Type [employer]    Authorization Time Period No auth, no VL    Progress Note Due on Visit 8    PT Start Time 0958    PT Stop Time 1038    PT Time Calculation (min) 40 min    Activity Tolerance Patient tolerated treatment well    Behavior During Therapy [facility] for tasks assessed/performed             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   Osteopenia    Raynaud's disease    Raynaud'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 01/27/2016   Rosacea 01/27/2016   Varicose veins    Vitamin D deficiency 01/27/2016   Past Surgical History:  Procedure Laterality Date   ABDOMINAL HYSTERECTOMY     CESAREAN SECTION     CHOLECYSTECTOMY     COLONOSCOPY  2023   COLONOSCOPY WITH PROPOFOL N/A 09/29/2021   Procedure: COLONOSCOPY WITH PROPOFOL;  Surgeon: Carol Ada, MD;  Location: [facility];  Service: Gastroenterology;  Laterality: N/A;   HEMOSTASIS CLIP PLACEMENT  09/29/2021   Procedure: HEMOSTASIS CLIP PLACEMENT;  Surgeon: Carol Ada, MD;  Location: [facility];  Service: Gastroenterology;;   INTESTINAL BLOCKAGE     POLYPECTOMY  09/29/2021   Procedure: POLYPECTOMY;  Surgeon: Carol Ada, MD;  Location: [facility];  Service: Gastroenterology;;   TONSILLECTOMY     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 01/27/2016    ADD (attention deficit disorder) 01/27/2016   Rosacea 01/27/2016   Vitamin D deficiency 01/27/2016   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 Wants to hold off on needling. She saw her MD and notes that her hip is really weak. She has trouble going up and down stairs. She notes her elbow is bothering her the most today. She has been referred to specialist at [facility] for this at an upcoming date.   PERTINENT HISTORY: See above  PAIN:  Are you having pain? Yes: NPRS scale: RT elbow 9/10, back pain 0/10, hip  0/10 Pain location: right hip Pain description: Sore Aggravating factors: walking, stairs, standing Relieving factors: lying flat, seated  PRECAUTIONS: None  WEIGHT BEARING RESTRICTIONS No  FALLS:  Has patient fallen in last 6 months? No  LIVING ENVIRONMENT: Lives with: lives with their family  and lives with their spouse Lives in: House/apartment Stairs: Yes: Internal: 13 steps; on right going up and on left going up and External: 2 steps; on right going up, on left going up, and can reach both Has following equipment at home: None  OCCUPATION: Retired, hobbies - cook, yard work, reading  PLOF: Independent  PATIENT GOALS Decrease pain, improve walking, standing, and stair ability, stand long enough to cook   OBJECTIVE:   DIAGNOSTIC FINDINGS: n /a  PATIENT SURVEYS:  FOTO  44%  COGNITION:  Overall cognitive status: Within functional limits for tasks assessed     SENSATION: WFL  PALPATION: TTP greater trochanter TrPs noted Rt glute max and medius  LOWER EXTREMITY ROM:  Active ROM Right eval Left eval  Hip flexion    Hip extension    Hip abduction    Hip adduction    Hip internal rotation 36 31  Hip external rotation 29 36  Knee flexion    Knee extension    Ankle dorsiflexion    Ankle plantarflexion    Ankle inversion    Ankle eversion     (Blank rows = not tested)  LUMBAR ROM:  Active ROM Eval  Flexion Distal 1/3 shin (P! Rt hip)  Extension WFL  Right Flexion Joint line  Left Flexion Joint line   LOWER EXTREMITY MMT:  MMT Right eval Left eval  Hip flexion 5 5  Hip extension 5 5  Hip abduction 5 5  Hip adduction 5 5  Hip internal rotation    Hip external rotation 4+ 4  Knee flexion 5 5  Knee extension 5 5  Ankle dorsiflexion 5 5  Ankle plantarflexion    Ankle inversion    Ankle eversion     (Blank rows = not tested)  LOWER EXTREMITY SPECIAL TESTS:  Hip special tests: Saralyn Pilar (FABER) test: positive , Hip scouring test: positive , and Anterior hip impingement test: positive  (Right) Slump test + Rt (calf) SLR + Rt  FUNCTIONAL TESTS:  5 times sit to stand: 13.2 sec  11/7 - 8 sec  GAIT: Time limited    TODAY'S TREATMENT:  11/27/21  Knee to chest 3 x 30"  LTR 5 x 10" Open book stretch 10 x 5"   Prone glute extension RLE 3lb 2 x 10 ( knee flexed)   Clamshell RTB x20 RLE Bridge 2 x 10   8 inch step up 2 x 10   11/23/21  Standing:  Heelraises 20X   Hip abduction 2X10 each   Hip extension 2X10 each   Vectors 10X3" holds each LE   Squats 10X   ITB stretch 30"  11/21/21 Reassessment                                 Supine:                        Active hamstring stretch 30" x 2                        Knee to chest 30" x 3                         Bridge x 15                        Lt side  lying:                         Clam 2x 10                         Prone:                        POE x 10                         Passive quad stretch x30" ea                        Hip extension x 10                        Sitting:                        Sit to stand x 10                         11/17/21                           Standing:                          Step up 4" x 10 B                         Lateral step up 4" x 10 B                          Step down  2"x 10 B                         Side step with green theraband x 1 RT                          Functional squat x 10                         Heel raise x 10                          Single leg stance B 3 reps RT:2"   LT:2"                            RT IT band stretch                          Theraband:                         Rows x 10                           Shld extension x 5                          Sitting:  piriformis stretch B  1' x 2                         Sit to stand x 10                                                  Supine:                        Active hamstring stretch 30" x 2                        Knee to chest 30" x 3                         Bridge x 15                        Lt side lying:                        Clam x 10                         Hip abduction x 10                         Prone:                        POE x 1 minute                         Passive quad stretch                        Hip extension x 10                        Sitting:                        Sit to stand x 10  PATIENT EDUCATION:  Education details: findings HEP, PT role, POC, modalities as needed Person educated: Patient Education method: Explanation Education comprehension: verbalized understanding   HOME EXERCISE PROGRAM: Access Code: [email or web address] URL: [email or web address] Date: 10/23/2021 Prepared by: Candie Mile 11/15/2021 - Supine Single Knee to Chest Stretch  - 2 x daily - 7 x weekly - 1 sets - 3 reps  - 30" hold - Supine Hamstring Stretch  - 2 x daily - 7 x weekly - 1 sets - 3 reps - 30" hold - Clamshell  - 1 x daily - 7 x weekly - 1 sets - 10 reps - 3-5" hold - Sidelying Hip Abduction  - 1 x daily - 7 x weekly - 1 sets - 10 reps - 3-5"  hold - Prone Hip Extension - One Pillow  - 1 x daily - 7 x weekly - 3 sets - 10 reps Exercises - Supine Bridge  - 2 x daily - 7 x weekly - 2 sets - 10 reps - 5 seconds hold - Supine Transversus Abdominis Bracing - Hands on Stomach  - 3 x daily - 7 x weekly -  3 sets - 10 reps - Supine Piriformis Stretch with Foot on Ground  - 3 x daily - 7 x weekly - 3 sets - 15-30 hold - Supine Figure 4 Piriformis Stretch (Mirrored)  - 3 x daily - 7 x weekly - 3 sets - 15-30 sec hold  Access Code: [email or web address] URL: [email or web address] Date: 11/23/2021 Prepared by: Roseanne Reno Exercises - ITB Stretch at [employer] (Mirrored)  - 2 x daily - 7 x weekly - 1 sets - 3 reps - 30 sec hold  ASSESSMENT:  CLINICAL IMPRESSION: Patient defers dry needling trial today per no current symptoms. Tolerated session well overall. Progressed spinal mobility and LE strengthening. Patient well challenged with weighted hip extension as well as resisted clamshell, requiring verbal and tactile cues for improved form and muscle activation. Patient did note full resolution of elbow pain following open book stretching. Patient will continue to benefit from skilled therapy services to reduce remaining deficits and improve functional ability.    OBJECTIVE IMPAIRMENTS Abnormal gait, decreased activity tolerance, decreased knowledge of condition, decreased mobility, difficulty walking, decreased ROM, decreased strength, hypomobility, increased fascial restrictions, increased muscle spasms, impaired flexibility, improper body mechanics, and pain.   ACTIVITY LIMITATIONS carrying, lifting, bending, sitting, standing, squatting, sleeping, stairs, transfers, bed mobility, and locomotion  level  PARTICIPATION LIMITATIONS: meal prep, cleaning, laundry, shopping, community activity, and yard work  PERSONAL FACTORS Age, Past/current experiences, and Time since onset of injury/illness/exacerbation are also affecting patient's functional outcome.   REHAB POTENTIAL: Good  CLINICAL DECISION MAKING: Stable/uncomplicated  EVALUATION COMPLEXITY: Low   GOALS: Goals reviewed with patient? Yes  SHORT TERM GOALS: Target date: 11/06/21  Patient will be independent with initial HEP and self-management strategies to improve functional outcomes Baseline: Initiated Goal status: MET  2. Eliminate myofascial trigger points and TTP of Rt glute and greater trochanter.   Baseline: TrPs in Rt glute max and med; minimal  Goal status: IN PROGRESS  LONG TERM GOALS: Target date: 11/20/21  Patient will be independent with advanced HEP and self-management strategies to improve functional outcomes Baseline: Completed short term HEP, advanced to More advanced program. Goal status: IN PROGRESS  2.  Patient will improve FOTO score to predicted of 59%value to indicate improvement in functional outcomes Baseline: 44; (11/7 - 46) Goal status: IN PROGRESS  3.  Patient will report 75% improvement in symptoms to progress towards PLOF. Baseline: 85% Goal status: Goal MET  4. Patient will have equal to or > 4+/5 MMT throughout BIL LEs to improve ability to perform functional mobility, stair ambulation and ADLs.  Baseline: See above (hip ER still shy of goal) Goal status: IN PROGRESS  5. Patient will improve 5 time sit to stand < 12.6 sec to demonstrate significant improvement in functional strength with transfers. Baseline: 13.2 sec (11/7 8.6 sec no hands) Goal status: MET   PLAN: PT FREQUENCY: 2x/week  PT DURATION: 4 weeks  PLANNED INTERVENTIONS: Therapeutic exercises, Therapeutic activity, Neuromuscular re-education, Balance training, Gait training, Patient/Family education, Self Care,  Joint mobilization, Joint manipulation, Stair training, DME instructions, Dry Needling, Electrical stimulation, Spinal manipulation, Spinal mobilization, Cryotherapy, Moist heat, Taping, Traction, Ultrasound, Biofeedback, Ionotophoresis 83m/ml Dexamethasone, Manual therapy, and Re-evaluation  PLAN FOR NEXT SESSION:  Issue updated HEP to include open book and resisted clamshell (medbridge site down on 11/27/21) Progressive strengthening of Rt LE proximal muscles, and core. (Has some lumbar symptoms overlapping Rt hip)  10:51 AM, 11/27/21 CJosue HectorPT DPT  [facility]rap[facility] (  [phone]

## 2021-11-29 ENCOUNTER — Ambulatory Visit: Payer: Medicare HMO | Admitting: Vascular Surgery

## 2021-11-29 ENCOUNTER — Encounter: Payer: Self-pay | Admitting: Vascular Surgery

## 2021-11-29 VITALS — BP 113/76 | HR 60 | Temp 98.1°F | Resp 14 | Ht 64.0 in | Wt 135.0 lb

## 2021-11-29 DIAGNOSIS — I872 Venous insufficiency (chronic) (peripheral): Secondary | ICD-10-CM

## 2021-11-29 DIAGNOSIS — I83811 Varicose veins of right lower extremities with pain: Secondary | ICD-10-CM

## 2021-11-29 NOTE — Progress Notes (Signed)
REASON FOR VISIT:   Follow-up of painful varicose veins of the right lower extremity.  MEDICAL ISSUES:   PAINFUL VARICOSE VEINS RIGHT LOWER EXTREMITY: This patient has CEAP C2 venous disease.  She has significant aching pain and heaviness in the right leg associated with standing and relieved with elevation.  She has been wearing her thigh-high compression stockings with a gradient of 20 to 30 mmHg.  Given that she is having persistent symptoms I think she would be a good candidate for laser ablation of the right great saphenous vein.  Vein becomes superficial in the distal thigh so I would likely cannulate the vein at the junction of the mid and distal thigh.  I could probably get the vein where it becomes more superficial with some stabs in the distal thigh.  She would also require 10-20 stabs in the medial calf.  I have discussed the indications for endovenous laser ablation of the right GSV, that is to lower the pressure in the veins and potentially help relieve the symptoms from venous hypertension.  I have also discussed alternative options such as conservative treatment as described above. I have discussed the potential complications of the procedure, including bleeding, bruising, leg swelling, deep venous thrombosis (<1% risk), or failure of the vein to close <1% risk).  I have also explained that venous insufficiency is a chronic disease, and that the patient is at risk for recurrent varicose veins in the future.  All of the patient's questions were encouraged and answered. They are agreeable to proceed.   I have discussed with the patient the indications for stab phlebectomy.  I have explained to the patient that that will have small scars from the stab incisions.  I explained that the other risks include bruising, bleeding, and phlebitis.    HPI:   Lauren Knox is a pleasant 76 y.o. female who I saw with painful varicose veins on 08/23/2021.  Her symptoms were becoming worse over  the last 4 months.  She has no previous history of DVT.  She had dilated varicose veins in her right calf.  Of note she had a saphenous vein cutdown during an emergency pregnancy back in the 1970s.  Results of her duplex scan are summarized in the diagram below.  She had C2 venous disease.  We discussed the importance of daily leg elevation and the proper positioning for this.  She was fitted for thigh-high compression stockings with a gradient of 20 to 30 mmHg.  She was encouraged to avoid prolonged sitting and standing and we discussed the importance of exercise.  She comes in for 64-monthfollow-up visit.  I felt that if her symptoms did not improve significantly she would be a candidate for laser ablation of the right great saphenous vein with 10-20 stabs.  We would likely cannulate the vein in the proximal calf.  Since I saw her last, she continues to have some aching pain and heaviness in the right leg which is aggravated by standing and relieved with elevation.  She has been wearing her thigh-high compression stockings with a gradient of 20 to 30 mmHg.  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   Osteopenia    Raynaud's disease    Raynaud'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 01/27/2016   Rosacea 01/27/2016   Varicose veins  Vitamin D deficiency 01/27/2016    Family History  Problem Relation Age of Onset   Cancer Mother    Cancer Father    Heart attack Father    Breast cancer Sister    Cancer Brother     SOCIAL HISTORY: Social History   Tobacco Use   Smoking status: Never    Passive exposure: Past   Smokeless tobacco: Never  Substance Use Topics   Alcohol use: Yes    Alcohol/week: 4.0 standard drinks of alcohol    Types: 4 Glasses of wine per week    Comment: occ    Allergies  Allergen Reactions    Codeine Nausea And Vomiting   Lisinopril Cough    Current Outpatient Medications  Medication Sig Dispense Refill   amLODipine (NORVASC) 5 MG tablet Take 5 mg by mouth daily.  12   cholecalciferol (VITAMIN D) 1000 units tablet Take 1,000 Units by mouth daily.     Insulin Pen Needle 31G X 5 MM MISC Use to inject Forteo into the skin once daily. DISCARD AFTER USE. DO NOT RESUSE 100 each 2   methocarbamol (ROBAXIN) 500 MG tablet TAKE 1 TABLET(500 MG) BY MOUTH DAILY AS NEEDED FOR MUSCLE SPASMS 30 tablet 0   methylphenidate (RITALIN) 10 MG tablet Take 10-15 mg by mouth daily as needed (Focus).     metroNIDAZOLE (METROCREAM) 0.75 % cream Apply 1 Application topically every morning.  4   naproxen (NAPROSYN) 500 MG tablet Take 1 tablet (500 mg total) by mouth 2 (two) times daily as needed for mild pain or moderate pain. 30 tablet 0   olmesartan (BENICAR) 20 MG tablet Take 20 mg by mouth daily.     Probiotic Product (PRO-BIOTIC BLEND PO) Take 1 capsule by mouth daily.     Teriparatide, Recombinant, (FORTEO) 600 MCG/2.4ML SOPN Inject 20 mcg into the skin daily. 9.6 mL 0   tiZANidine (ZANAFLEX) 2 MG tablet Take 2 mg by mouth every 8 (eight) hours as needed for muscle spasms.     tretinoin (RETIN-A) 0.025 % cream Apply 1 application  topically at bedtime.  4   Calcium Carb-Cholecalciferol (CALCIUM 600/VITAMIN D3 PO) Take 1 tablet by mouth daily. (Patient not taking: Reported on 11/29/2021)     No current facility-administered medications for this visit.    REVIEW OF SYSTEMS:  '[X]'$  denotes positive finding, '[ ]'$  denotes negative finding Cardiac  Comments:  Chest pain or chest pressure:    Shortness of breath upon exertion:    Short of breath when lying flat:    Irregular heart rhythm:        Vascular    Pain in calf, thigh, or hip brought on by ambulation:    Pain in feet at night that wakes you up from your sleep:     Blood clot in your veins:    Leg swelling:         Pulmonary    Oxygen at  home:    Productive cough:     Wheezing:         Neurologic    Sudden weakness in arms or legs:     Sudden numbness in arms or legs:     Sudden onset of difficulty speaking or slurred speech:    Temporary loss of vision in one eye:     Problems with dizziness:         Gastrointestinal    Blood in stool:     Vomited blood:  Genitourinary    Burning when urinating:     Blood in urine:        Psychiatric    Major depression:         Hematologic    Bleeding problems:    Problems with blood clotting too easily:        Skin    Rashes or ulcers:        Constitutional    Fever or chills:     PHYSICAL EXAM:   Vitals:   11/29/21 1316  BP: 113/76  Pulse: 60  Resp: 14  Temp: 98.1 F (36.7 C)  TempSrc: Temporal  SpO2: 98%  Weight: 135 lb (61.2 kg)  Height: '5\' 4"'$  (1.626 m)    GENERAL: The patient is a well-nourished female, in no acute distress. The vital signs are documented above. CARDIAC: There is a regular rate and rhythm.  VASCULAR: She has palpable pedal pulses. I again looked at the right great saphenous vein myself with the SonoSite.  The vein becomes superficial in the distal thigh.  I would likely cannulate the vein at the junction of the mid and distal thigh. She has multiple large varicose veins that are under significant pressure in her medial right calf and also in the distal thigh. PULMONARY: There is good air exchange bilaterally without wheezing or rales. ABDOMEN: Soft and non-tender with normal pitched bowel sounds.  MUSCULOSKELETAL: There are no major deformities or cyanosis. NEUROLOGIC: No focal weakness or paresthesias are detected. SKIN: There are no ulcers or rashes noted. PSYCHIATRIC: The patient has a normal affect.  DATA:    VENOUS DUPLEX: The results of her venous duplex scan on 08/23/2021 are summarized below.    Deitra Mayo [facility] [phone]

## 2021-11-30 ENCOUNTER — Encounter (HOSPITAL_COMMUNITY): Payer: Medicare HMO | Admitting: Physical Therapy

## 2021-11-30 NOTE — Progress Notes (Signed)
[name] D.[facility] Phone: [phone]   Assessment and Plan:     1. Chronic right-sided thoracic back pain 2. Chronic bilateral low back pain without sciatica -Chronic with exacerbation, subsequent visit - Patient continuing to experience flare of low back pain, though overall has had improvement since physical therapy and OMT previous office visit - Discussed repeat OMT at today's visit, however patient's more pressing issue was her continued right elbow pain which she decided to focus on at today's visit  3. Right elbow pain -Chronic with exacerbation, initial sports medicine visit - Patient has had intermittent right elbow pain for the past 5 months without specific MOI.  Most consistent with distal biceps tendinitis based on HPI, physical exam - Recommend starting HEP for right elbow - Start meloxicam 15 mg daily x2 weeks.  If still having pain after 2 weeks, complete 3rd-week of meloxicam. May use remaining meloxicam as needed once daily for pain control.  Do not to use additional NSAIDs while taking meloxicam.  May use Tylenol [phone] mg 2 to 3 times a day for breakthrough pain.   Other orders - meloxicam (MOBIC) 15 MG tablet; Take 1 tablet (15 mg total) by mouth daily.  Pertinent previous records reviewed include none   Follow Up: 3 weeks for reevaluation.  Could perform right elbow x-ray if no improvement or worsening of symptoms.  Could consider repeat OMT   Subjective:   I, Moenique Parris, am serving as a Education administrator for Doctor Glennon Mac   Chief Complaint: back pain    HPI:    11/07/2021 female who presents today for acute on chronic right-sided low back and mid back pain with intermittent spasms. Rosalita states that over the last year or 2 she has intermittently had this right-sided mid back pain.  Usually it would go away on its own, however over the last month this has been exacerbated and has  been associated with intermittent spasms.  This is limiting her ability to do physical therapy for the hip.  Feels like a shooting and burning pain that goes up and down the right mid back.  She does take tizanidine at nighttime which does help control the pain and spasms occasionally.  Occasionally will take ibuprofen as needed.  No radicular symptoms of the upper or lower extremities.  She gets some pain at night when trying to lie down, pain is worse with twisting and bending about the thoracic spine as well.   11/09/2021 Patient is a 76 year old female complaining of back pain. Patient states that all summer she has felt a hot poker poking her , pcp said it looks like her muscles are so tight she moves her body it causes pain , she had shots in the back thoracic pain doc told her it would last a week , anything she did would aggravate her , was also seen  by rheumatology    12/01/2021 Patient states that she is okay , needs a tune up   Relevant Historical Information: Osteoporosis, Raynaud's  Additional pertinent review of systems negative.  Current Outpatient Medications  Medication Sig Dispense Refill   amLODipine (NORVASC) 5 MG tablet Take 5 mg by mouth daily.  12   Calcium Carb-Cholecalciferol (CALCIUM 600/VITAMIN D3 PO) Take 1 tablet by mouth daily.     cholecalciferol (VITAMIN D) 1000 units tablet Take 1,000 Units by mouth daily.     Insulin Pen Needle 31G X 5  MM MISC Use to inject Forteo into the skin once daily. DISCARD AFTER USE. DO NOT RESUSE 100 each 2   meloxicam (MOBIC) 15 MG tablet Take 1 tablet (15 mg total) by mouth daily. 30 tablet 0   methocarbamol (ROBAXIN) 500 MG tablet TAKE 1 TABLET(500 MG) BY MOUTH DAILY AS NEEDED FOR MUSCLE SPASMS 30 tablet 0   methylphenidate (RITALIN) 10 MG tablet Take 10-15 mg by mouth daily as needed (Focus).     metroNIDAZOLE (METROCREAM) 0.75 % cream Apply 1 Application topically every morning.  4   naproxen (NAPROSYN) 500 MG tablet Take 1  tablet (500 mg total) by mouth 2 (two) times daily as needed for mild pain or moderate pain. 30 tablet 0   olmesartan (BENICAR) 20 MG tablet Take 20 mg by mouth daily.     Probiotic Product (PRO-BIOTIC BLEND PO) Take 1 capsule by mouth daily.     Teriparatide, Recombinant, (FORTEO) 600 MCG/2.4ML SOPN Inject 20 mcg into the skin daily. 9.6 mL 0   tiZANidine (ZANAFLEX) 2 MG tablet Take 2 mg by mouth every 8 (eight) hours as needed for muscle spasms.     tretinoin (RETIN-A) 0.025 % cream Apply 1 application  topically at bedtime.  4   No current facility-administered medications for this visit.      Objective:     Vitals:   12/01/21 1116  BP: 132/80  Pulse: (!) 56  SpO2: 98%  Weight: 135 lb (61.2 kg)  Height: '5\' 4"'$  (1.626 m)      Body mass index is 23.17 kg/m.    Physical Exam:     General: Appears well, no acute distress, nontoxic and pleasant Neck: FROM, no pain Neuro: sensation is intact distally with no deficits, strenghth is 5/5 in elbow flexors/extenders/supinator/pronators and wrist flexors/extensors Psych: no evidence of anxiety or depression  Right ELBOW: no deformity, swelling or muscle wasting Normal Carrying angle ROM:0-140, supination and pronation 90 TTP distal biceps tendon NTTP over triceps, ticeps tendon, olecronon, lat epicondyle, medial epicondyle, antecubital fossa, supinator, pronator Negative tinnels over cubital tunnel No pain with resisted wrist and middle digit extension No pain with resisted wrist flexion No pain with resisted supination No pain with resisted pronation Negative valgus stress Negative varus stress Negative milking maneuver  No pain with resisted elbow flexion with wrist supinated and with wrist pronated  Electronically signed by:  [name] D.[facility] 11:41 AM 12/01/21

## 2021-12-01 ENCOUNTER — Ambulatory Visit: Payer: Medicare HMO | Admitting: Sports Medicine

## 2021-12-01 VITALS — BP 132/80 | HR 56 | Ht 64.0 in | Wt 135.0 lb

## 2021-12-01 DIAGNOSIS — M25521 Pain in right elbow: Secondary | ICD-10-CM | POA: Diagnosis not present

## 2021-12-01 DIAGNOSIS — G8929 Other chronic pain: Secondary | ICD-10-CM | POA: Diagnosis not present

## 2021-12-01 DIAGNOSIS — M545 Low back pain, unspecified: Secondary | ICD-10-CM | POA: Diagnosis not present

## 2021-12-01 DIAGNOSIS — M546 Pain in thoracic spine: Secondary | ICD-10-CM | POA: Diagnosis not present

## 2021-12-01 MED ORDER — MELOXICAM 15 MG PO TABS: 15.0000 mg | ORAL_TABLET | Freq: Every day | ORAL | 0 refills | Status: DC

## 2021-12-01 NOTE — Patient Instructions (Addendum)
Good to see you  - Start meloxicam 15 mg daily x2 weeks.  If still having pain after 2 weeks, complete 3rd-week of meloxicam. May use remaining meloxicam as needed once daily for pain control.  Do not to use additional NSAIDs while taking meloxicam.  May use Tylenol [phone] mg 2 to 3 times a day for breakthrough pain. Elbow HEP  3 week follow up

## 2021-12-05 ENCOUNTER — Ambulatory Visit (HOSPITAL_COMMUNITY): Payer: Medicare HMO | Admitting: Physical Therapy

## 2021-12-05 DIAGNOSIS — M25551 Pain in right hip: Secondary | ICD-10-CM

## 2021-12-05 DIAGNOSIS — R262 Difficulty in walking, not elsewhere classified: Secondary | ICD-10-CM

## 2021-12-05 DIAGNOSIS — M6281 Muscle weakness (generalized): Secondary | ICD-10-CM

## 2021-12-05 DIAGNOSIS — R2689 Other abnormalities of gait and mobility: Secondary | ICD-10-CM

## 2021-12-05 NOTE — Therapy (Signed)
OUTPATIENT PHYSICAL THERAPY LOWER EXTREMITY TREATMENT   Patient Name: Lauren Knox MRN: [id number] DOB:20-Sep-1945, 76 y.o., female Today's Date: 12/05/2021  END OF SESSION:   PT End of Session - 12/05/21 1058     Visit Number 8    Number of Visits 16    Date for PT Re-Evaluation 12/19/21    Authorization Type [employer]    Authorization Time Period No auth, no VL    Progress Note Due on Visit 16    PT Start Time 0950    PT Stop Time 1040    PT Time Calculation (min) 50 min    Activity Tolerance Patient tolerated treatment well    Behavior During Therapy [facility] for tasks assessed/performed              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   Osteopenia    Raynaud's disease    Raynaud'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 01/27/2016   Rosacea 01/27/2016   Varicose veins    Vitamin D deficiency 01/27/2016   Past Surgical History:  Procedure Laterality Date   ABDOMINAL HYSTERECTOMY     CESAREAN SECTION     CHOLECYSTECTOMY     COLONOSCOPY  2023   COLONOSCOPY WITH PROPOFOL N/A 09/29/2021   Procedure: COLONOSCOPY WITH PROPOFOL;  Surgeon: Carol Ada, MD;  Location: [facility];  Service: Gastroenterology;  Laterality: N/A;   HEMOSTASIS CLIP PLACEMENT  09/29/2021   Procedure: HEMOSTASIS CLIP PLACEMENT;  Surgeon: Carol Ada, MD;  Location: [facility];  Service: Gastroenterology;;   INTESTINAL BLOCKAGE     POLYPECTOMY  09/29/2021   Procedure: POLYPECTOMY;  Surgeon: Carol Ada, MD;  Location: [facility];  Service: Gastroenterology;;   TONSILLECTOMY     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 01/27/2016    ADD (attention deficit disorder) 01/27/2016   Rosacea 01/27/2016   Vitamin D deficiency 01/27/2016   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   Muscle weakness (generalized) 09/17/2013   Scoliosis concern 09/17/2013   Stiffness of joint, not elsewhere classified, pelvic region and thigh 09/17/2013   Difficulty in walking(719.7) 09/17/2013    PCP: Asencion Noble, MD  REFERRING PROVIDER: Elba Barman, DO   REFERRING DIAG: [phone] (ICD-10-CM) - Greater trochanteric pain syndrome of right lower extremity   THERAPY DIAG:  Muscle weakness (generalized)  Pain in right hip  Other abnormalities of gait and mobility  Difficulty in walking, not elsewhere classified  Rationale for Evaluation and Treatment Rehabilitation  ONSET DATE: Early summer 2023  SUBJECTIVE:   SUBJECTIVE STATEMENT: Patient states she feels she is improving but her legs and hips are still weak yet. She states bending over, pushing and pulling are challenging (like with housework. Vacuuming, lifting things off floor, etc.) but is getting better.  Going up/down stairs is better but feels her balance is off a little.  States she's seen Dr Glennon Mac (sports medicine MD) twice and feels his manipulation has helped.     PERTINENT HISTORY: See above  PAIN:  Are you having pain? Yes: NPRS scale: RT elbow 9/10, back pain 0/10, hip  0/10 Pain location: right hip Pain description: Sore Aggravating factors: walking, stairs, standing Relieving factors: lying flat, seated  PRECAUTIONS:  None  WEIGHT BEARING RESTRICTIONS No  FALLS:  Has patient fallen in last 6 months? No  LIVING ENVIRONMENT: Lives with: lives with their family and lives with their spouse Lives in: House/apartment Stairs: Yes: Internal: 13 steps; on right going up and on left going up and External: 2 steps; on right going up, on left going up, and can reach both Has following equipment at home: None  OCCUPATION: Retired, hobbies - cook, yard work,  reading  PLOF: Independent  PATIENT GOALS Decrease pain, improve walking, standing, and stair ability, stand long enough to cook   OBJECTIVE:   DIAGNOSTIC FINDINGS: n /a  PATIENT SURVEYS:  FOTO 44%  COGNITION:  Overall cognitive status: Within functional limits for tasks assessed     SENSATION: WFL  PALPATION: TTP greater trochanter TrPs noted Rt glute max and medius  LOWER EXTREMITY ROM:  Active ROM Right eval Left eval  Hip flexion    Hip extension    Hip abduction    Hip adduction    Hip internal rotation 36 31  Hip external rotation 29 36  Knee flexion    Knee extension    Ankle dorsiflexion    Ankle plantarflexion    Ankle inversion    Ankle eversion     (Blank rows = not tested)  LUMBAR ROM:  Active ROM Eval  Flexion Distal 1/3 shin (P! Rt hip)  Extension WFL  Right Flexion Joint line  Left Flexion Joint line   LOWER EXTREMITY MMT:  MMT Right eval Left eval  Hip flexion 5 5  Hip extension 5 5  Hip abduction 5 5  Hip adduction 5 5  Hip internal rotation    Hip external rotation 4+ 4  Knee flexion 5 5  Knee extension 5 5  Ankle dorsiflexion 5 5  Ankle plantarflexion    Ankle inversion    Ankle eversion     (Blank rows = not tested)  LOWER EXTREMITY SPECIAL TESTS:  Hip special tests: Saralyn Pilar (FABER) test: positive , Hip scouring test: positive , and Anterior hip impingement test: positive  (Right) Slump test + Rt (calf) SLR + Rt  FUNCTIONAL TESTS:  5 times sit to stand: 13.2 sec  11/7 - 8 sec  GAIT: Time limited    TODAY'S TREATMENT:  12/05/21  Standing 6" step up forward 2X10   6" lateral step up 2X10   Vectors 5X5'  Supine: clams with RTB resistance at same time 10X5"   Open Book stretch  11/27/21  Knee to chest 3 x 30"  LTR 5 x 10" Open book stretch 10 x 5"   Prone glute extension RLE 3lb 2 x 10 ( knee flexed)   Clamshell RTB x20 RLE Bridge 2 x 10   8 inch step up 2 x 10   11/23/21  Standing:  Heelraises  20X   Hip abduction 2X10 each   Hip extension 2X10 each   Vectors 10X3" holds each LE   Squats 10X   ITB stretch 30"  11/21/21 Reassessment                                 Supine:                        Active hamstring stretch 30" x 2  Knee to chest 30" x 3                         Bridge x 15                        Lt side lying:                        Clam 2x 10                         Prone:                        POE x 10                         Passive quad stretch x30" ea                        Hip extension x 10                        Sitting:                        Sit to stand x 10                         11/17/21                           Standing:                          Step up 4" x 10 B                         Lateral step up 4" x 10 B                          Step down  2"x 10 B                         Side step with green theraband x 1 RT                          Functional squat x 10                         Heel raise x 10                          Single leg stance B 3 reps RT:2"   LT:2"                            RT IT band stretch                          Theraband:                         Rows x 10  Shld extension x 5                          Sitting:  piriformis stretch B 1' x 2                         Sit to stand x 10                                                  Supine:                        Active hamstring stretch 30" x 2                        Knee to chest 30" x 3                         Bridge x 15                        Lt side lying:                        Clam x 10                         Hip abduction x 10                         Prone:                        POE x 1 minute                         Passive quad stretch                        Hip extension x 10                        Sitting:                        Sit to stand x 10  PATIENT EDUCATION:  Education details: findings  HEP, PT role, POC, modalities as needed Person educated: Patient Education method: Explanation Education comprehension: verbalized understanding   HOME EXERCISE PROGRAM: Access Code: [email or web address] URL: [email or web address] Date: 10/23/2021 Prepared by: Candie Mile 11/15/2021 - Supine Single Knee to Chest Stretch  - 2 x daily - 7 x weekly - 1 sets - 3 reps - 30" hold - Supine Hamstring Stretch  - 2 x daily - 7 x weekly - 1 sets - 3 reps - 30" hold - Clamshell  - 1 x daily - 7 x weekly - 1 sets - 10 reps - 3-5" hold - Sidelying Hip Abduction  - 1 x daily - 7 x weekly - 1 sets - 10 reps - 3-5"  hold - Prone Hip Extension - One Pillow  - 1 x daily - 7 x weekly - 3 sets - 10 reps Exercises - Supine Bridge  - 2  x daily - 7 x weekly - 2 sets - 10 reps - 5 seconds hold - Supine Transversus Abdominis Bracing - Hands on Stomach  - 3 x daily - 7 x weekly - 3 sets - 10 reps - Supine Piriformis Stretch with Foot on Ground  - 3 x daily - 7 x weekly - 3 sets - 15-30 hold - Supine Figure 4 Piriformis Stretch (Mirrored)  - 3 x daily - 7 x weekly - 3 sets - 15-30 sec hold  Access Code: [email or web address] URL: [email or web address] Date: 11/23/2021 Prepared by: Roseanne Reno Exercises - ITB Stretch at [employer] (Mirrored)  - 2 x daily - 7 x weekly - 1 sets - 3 reps - 30 sec hold    Access Code: [email or web address] URL: [email or web address] Date: 12/05/2021 Prepared by: Roseanne Reno Exercises - Hooklying Clamshell with Resistance  - 2 x daily - 7 x weekly - 1 sets - 10 reps - 5 sec hold - Sidelying Thoracic Rotation with Open Book  - 2 x daily - 7 x weekly - 1 sets - 10 reps - 5 sec hold Vectors 5X5" each LE with 1 HHA  ASSESSMENT:  CLINICAL IMPRESSION: Patient with overall improvement, questions on added exercises last session.  Able to instruct in correct form and updated HEP to include sidelying open book and clams with resistance.  Also added vectors to work on hip  stabilization/strengthening and provided with HEP instructions for this activity.  PT did require verbal and tactile cues for improved form and muscle activation.  Patient will continue to benefit from skilled therapy services to reduce remaining deficits and improve functional ability.    OBJECTIVE IMPAIRMENTS Abnormal gait, decreased activity tolerance, decreased knowledge of condition, decreased mobility, difficulty walking, decreased ROM, decreased strength, hypomobility, increased fascial restrictions, increased muscle spasms, impaired flexibility, improper body mechanics, and pain.   ACTIVITY LIMITATIONS carrying, lifting, bending, sitting, standing, squatting, sleeping, stairs, transfers, bed mobility, and locomotion level  PARTICIPATION LIMITATIONS: meal prep, cleaning, laundry, shopping, community activity, and yard work  PERSONAL FACTORS Age, Past/current experiences, and Time since onset of injury/illness/exacerbation are also affecting patient's functional outcome.   REHAB POTENTIAL: Good  CLINICAL DECISION MAKING: Stable/uncomplicated  EVALUATION COMPLEXITY: Low   GOALS: Goals reviewed with patient? Yes  SHORT TERM GOALS: Target date: 11/06/21  Patient will be independent with initial HEP and self-management strategies to improve functional outcomes Baseline: Initiated Goal status: MET  2. Eliminate myofascial trigger points and TTP of Rt glute and greater trochanter.   Baseline: TrPs in Rt glute max and med; minimal  Goal status: IN PROGRESS  LONG TERM GOALS: Target date: 11/20/21  Patient will be independent with advanced HEP and self-management strategies to improve functional outcomes Baseline: Completed short term HEP, advanced to More advanced program. Goal status: IN PROGRESS  2.  Patient will improve FOTO score to predicted of 59%value to indicate improvement in functional outcomes Baseline: 44; (11/7 - 46) Goal status: IN PROGRESS  3.  Patient will report  75% improvement in symptoms to progress towards PLOF. Baseline: 85% Goal status: Goal MET  4. Patient will have equal to or > 4+/5 MMT throughout BIL LEs to improve ability to perform functional mobility, stair ambulation and ADLs.  Baseline: See above (hip ER still shy of goal) Goal status: IN PROGRESS  5. Patient will improve 5 time sit to stand < 12.6 sec to demonstrate significant improvement in functional strength with transfers. Baseline: 13.2 sec (11/7 8.6 sec no hands) Goal status: MET  PLAN: PT FREQUENCY: 2x/week  PT DURATION: 4 weeks  PLANNED INTERVENTIONS: Therapeutic exercises, Therapeutic activity, Neuromuscular re-education, Balance training, Gait training, Patient/Family education, Self Care, Joint mobilization, Joint manipulation, Stair training, DME instructions, Dry Needling, Electrical stimulation, Spinal manipulation, Spinal mobilization, Cryotherapy, Moist heat, Taping, Traction, Ultrasound, Biofeedback, Ionotophoresis 59m/ml Dexamethasone, Manual therapy, and Re-evaluation  PLAN FOR NEXT SESSION:   Progressive strengthening of Rt LE proximal muscles, and core. (Has some lumbar symptoms overlapping Rt hip)  10:58 AM, 12/05/21 ATeena Irani PTA/CLT CGuthriePh: [phone]

## 2021-12-12 ENCOUNTER — Ambulatory Visit (HOSPITAL_COMMUNITY): Payer: Medicare HMO

## 2021-12-12 ENCOUNTER — Encounter (HOSPITAL_COMMUNITY): Payer: Self-pay

## 2021-12-12 DIAGNOSIS — M25551 Pain in right hip: Secondary | ICD-10-CM | POA: Diagnosis not present

## 2021-12-12 DIAGNOSIS — M6281 Muscle weakness (generalized): Secondary | ICD-10-CM

## 2021-12-12 DIAGNOSIS — R262 Difficulty in walking, not elsewhere classified: Secondary | ICD-10-CM

## 2021-12-12 DIAGNOSIS — R2689 Other abnormalities of gait and mobility: Secondary | ICD-10-CM

## 2021-12-12 DIAGNOSIS — R29898 Other symptoms and signs involving the musculoskeletal system: Secondary | ICD-10-CM

## 2021-12-12 NOTE — Therapy (Signed)
OUTPATIENT PHYSICAL THERAPY LOWER EXTREMITY TREATMENT   Patient Name: Lauren Knox MRN: [id number] DOB:1945/02/05, 76 y.o., female Today's Date: 12/12/2021  END OF SESSION:   PT End of Session - 12/12/21 1428     Visit Number 9    Number of Visits 16    Date for PT Re-Evaluation 12/19/21    Authorization Type [employer]    Authorization Time Period No auth, no VL    Progress Note Due on Visit 10    PT Start Time 1348    PT Stop Time 1426    PT Time Calculation (min) 38 min    Activity Tolerance Patient tolerated treatment well    Behavior During Therapy [facility] for tasks assessed/performed               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   Osteopenia    Raynaud's disease    Raynaud'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 01/27/2016   Rosacea 01/27/2016   Varicose veins    Vitamin D deficiency 01/27/2016   Past Surgical History:  Procedure Laterality Date   ABDOMINAL HYSTERECTOMY     CESAREAN SECTION     CHOLECYSTECTOMY     COLONOSCOPY  2023   COLONOSCOPY WITH PROPOFOL N/A 09/29/2021   Procedure: COLONOSCOPY WITH PROPOFOL;  Surgeon: Carol Ada, MD;  Location: [facility];  Service: Gastroenterology;  Laterality: N/A;   HEMOSTASIS CLIP PLACEMENT  09/29/2021   Procedure: HEMOSTASIS CLIP PLACEMENT;  Surgeon: Carol Ada, MD;  Location: [facility];  Service: Gastroenterology;;   INTESTINAL BLOCKAGE     POLYPECTOMY  09/29/2021   Procedure: POLYPECTOMY;  Surgeon: Carol Ada, MD;  Location: [facility];  Service: Gastroenterology;;   TONSILLECTOMY     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  01/27/2016   ADD (attention deficit disorder) 01/27/2016   Rosacea 01/27/2016   Vitamin D deficiency 01/27/2016   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   Muscle weakness (generalized) 09/17/2013   Scoliosis concern 09/17/2013   Stiffness of joint, not elsewhere classified, pelvic region and thigh 09/17/2013   Difficulty in walking(719.7) 09/17/2013    PCP: Asencion Noble, MD  REFERRING PROVIDER: Elba Barman, DO   REFERRING DIAG: [phone] (ICD-10-CM) - Greater trochanteric pain syndrome of right lower extremity   THERAPY DIAG:  Muscle weakness (generalized)  Pain in right hip  Other abnormalities of gait and mobility  Difficulty in walking, not elsewhere classified  Other symptoms and signs involving the musculoskeletal system  Rationale for Evaluation and Treatment Rehabilitation  ONSET DATE: Early summer 2023  SUBJECTIVE:   SUBJECTIVE STATEMENT: Patient states she feels she is improving but her legs and hips are still weak yet. She states bending over, pushing and pulling are challenging (like with housework. Vacuuming, lifting things off floor, etc.) but is getting better.  Going up/down stairs is better but feels her balance is off a little.  States she's seen Dr Glennon Mac (sports medicine MD) twice and feels his manipulation has helped.     Pt stated she feels her back bothered her more than hip during Thanksgiving events.  No reports of pain currently.  Reports stairs are getting easier but continues to feel weak as  well as balance.  Stated some difficulty lifting objects from floor.    PERTINENT HISTORY: See above  PAIN:  Are you having pain? Yes: NPRS scale: RT elbow 9/10, back pain 0/10, hip  0/10 Pain location: right hip Pain description: Sore Aggravating factors: walking, stairs, standing Relieving factors: lying flat, seated  PRECAUTIONS: None  WEIGHT BEARING RESTRICTIONS No  FALLS:  Has patient fallen in last 6 months? No  LIVING ENVIRONMENT: Lives with:  lives with their family and lives with their spouse Lives in: House/apartment Stairs: Yes: Internal: 13 steps; on right going up and on left going up and External: 2 steps; on right going up, on left going up, and can reach both Has following equipment at home: None  OCCUPATION: Retired, hobbies - cook, yard work, reading  PLOF: Independent  PATIENT GOALS Decrease pain, improve walking, standing, and stair ability, stand long enough to cook   OBJECTIVE:   DIAGNOSTIC FINDINGS: n /a  PATIENT SURVEYS:  FOTO 44%  COGNITION:  Overall cognitive status: Within functional limits for tasks assessed     SENSATION: WFL  PALPATION: TTP greater trochanter TrPs noted Rt glute max and medius  LOWER EXTREMITY ROM:  Active ROM Right eval Left eval  Hip flexion    Hip extension    Hip abduction    Hip adduction    Hip internal rotation 36 31  Hip external rotation 29 36  Knee flexion    Knee extension    Ankle dorsiflexion    Ankle plantarflexion    Ankle inversion    Ankle eversion     (Blank rows = not tested)  LUMBAR ROM:  Active ROM Eval  Flexion Distal 1/3 shin (P! Rt hip)  Extension WFL  Right Flexion Joint line  Left Flexion Joint line   LOWER EXTREMITY MMT:  MMT Right eval Left eval  Hip flexion 5 5  Hip extension 5 5  Hip abduction 5 5  Hip adduction 5 5  Hip internal rotation    Hip external rotation 4+ 4  Knee flexion 5 5  Knee extension 5 5  Ankle dorsiflexion 5 5  Ankle plantarflexion    Ankle inversion    Ankle eversion     (Blank rows = not tested)  LOWER EXTREMITY SPECIAL TESTS:  Hip special tests: Saralyn Pilar (FABER) test: positive , Hip scouring test: positive , and Anterior hip impingement test: positive  (Right) Slump test + Rt (calf) SLR + Rt  FUNCTIONAL TESTS:  5 times sit to stand: 13.2 sec  11/7 - 8 sec  GAIT: Time limited    TODAY'S TREATMENT:  12/12/21  Standing:  Squat 10x 2 sets  Proper lifting ball from 8in step  10x  6in power step up 6in 10x 5"  6in step down 10x  Vector 5x5" with 1 HHA  SLS 2" each BLE  Marching 10x 3-5" holds  Paloff 15x 2 with BTB  12/05/21  Standing 6" step up forward 2X10   6" lateral step up 2X10   Vectors 5X5'  Supine: clams with RTB resistance at same time 10X5"   Open Book stretch  11/27/21  Knee to chest 3 x 30"  LTR 5 x 10" Open book stretch 10 x 5"   Prone glute extension RLE 3lb 2 x 10 ( knee flexed)   Clamshell RTB x20 RLE Bridge 2 x 10   8 inch step up 2 x 10   11/23/21  Standing:  Heelraises 20X   Hip abduction  2X10 each   Hip extension 2X10 each   Vectors 10X3" holds each LE   Squats 10X   ITB stretch 30"  11/21/21 Reassessment                                 Supine:                        Active hamstring stretch 30" x 2                        Knee to chest 30" x 3                         Bridge x 15                        Lt side lying:                        Clam 2x 10                         Prone:                        POE x 10                         Passive quad stretch x30" ea                        Hip extension x 10                        Sitting:                        Sit to stand x 10                         11/17/21                           Standing:                          Step up 4" x 10 B                         Lateral step up 4" x 10 B                          Step down  2"x 10 B                         Side step with green theraband x 1 RT                          Functional squat x 10                         Heel raise x 10  Single leg stance B 3 reps RT:2"   LT:2"                            RT IT band stretch                          Theraband:                         Rows x 10                           Shld extension x 5                          Sitting:  piriformis stretch B 1' x 2                         Sit to stand x 10                                                   Supine:                        Active hamstring stretch 30" x 2                        Knee to chest 30" x 3                         Bridge x 15                        Lt side lying:                        Clam x 10                         Hip abduction x 10                         Prone:                        POE x 1 minute                         Passive quad stretch                        Hip extension x 10                        Sitting:                        Sit to stand x 10  PATIENT EDUCATION:  Education details: findings HEP, PT role, POC, modalities as needed Person educated: Patient Education method: Explanation Education comprehension: verbalized understanding   HOME EXERCISE PROGRAM: Access Code: [email or web address] URL: [email or web address] Date: 10/23/2021 Prepared by: Candie Mile  11/15/2021 - Supine Single Knee to Chest Stretch  - 2 x daily - 7 x weekly - 1 sets - 3 reps - 30" hold - Supine Hamstring Stretch  - 2 x daily - 7 x weekly - 1 sets - 3 reps - 30" hold - Clamshell  - 1 x daily - 7 x weekly - 1 sets - 10 reps - 3-5" hold - Sidelying Hip Abduction  - 1 x daily - 7 x weekly - 1 sets - 10 reps - 3-5"  hold - Prone Hip Extension - One Pillow  - 1 x daily - 7 x weekly - 3 sets - 10 reps Exercises - Supine Bridge  - 2 x daily - 7 x weekly - 2 sets - 10 reps - 5 seconds hold - Supine Transversus Abdominis Bracing - Hands on Stomach  - 3 x daily - 7 x weekly - 3 sets - 10 reps - Supine Piriformis Stretch with Foot on Ground  - 3 x daily - 7 x weekly - 3 sets - 15-30 hold - Supine Figure 4 Piriformis Stretch (Mirrored)  - 3 x daily - 7 x weekly - 3 sets - 15-30 sec hold  Access Code: [email or web address] URL: [email or web address] Date: 11/23/2021 Prepared by: Roseanne Reno Exercises - ITB Stretch at [employer] (Mirrored)  - 2 x daily - 7 x weekly - 1 sets - 3 reps - 30 sec hold    Access Code: [email or web address] URL: [email or web address] Date:  12/05/2021 Prepared by: [place] with Resistance  - 2 x daily - 7 x weekly - 1 sets - 10 reps - 5 sec hold - Sidelying Thoracic Rotation with Open Book  - 2 x daily - 7 x weekly - 1 sets - 10 reps - 5 sec hold Vectors 5X5" each LE with 1 HHA  12/12/21: Squat- chair tapping  ASSESSMENT:  CLINICAL IMPRESSION: Session focus on hip stability and education on proper body mechanics.  Pt educated on proper lifting with demonstration and verbal cueing to improve mechanics.  Continued balance associated exercises for core and proximal strengthening, pt with most difficulty with SLS based exercises without HHA.  No reports of pain through session.    OBJECTIVE IMPAIRMENTS Abnormal gait, decreased activity tolerance, decreased knowledge of condition, decreased mobility, difficulty walking, decreased ROM, decreased strength, hypomobility, increased fascial restrictions, increased muscle spasms, impaired flexibility, improper body mechanics, and pain.   ACTIVITY LIMITATIONS carrying, lifting, bending, sitting, standing, squatting, sleeping, stairs, transfers, bed mobility, and locomotion level  PARTICIPATION LIMITATIONS: meal prep, cleaning, laundry, shopping, community activity, and yard work  PERSONAL FACTORS Age, Past/current experiences, and Time since onset of injury/illness/exacerbation are also affecting patient's functional outcome.   REHAB POTENTIAL: Good  CLINICAL DECISION MAKING: Stable/uncomplicated  EVALUATION COMPLEXITY: Low   GOALS: Goals reviewed with patient? Yes  SHORT TERM GOALS: Target date: 11/06/21  Patient will be independent with initial HEP and self-management strategies to improve functional outcomes Baseline: Initiated Goal status: MET  2. Eliminate myofascial trigger points and TTP of Rt glute and greater trochanter.   Baseline: TrPs in Rt glute max and med; minimal  Goal status: IN PROGRESS  LONG TERM GOALS: Target date:  11/20/21  Patient will be independent with advanced HEP and self-management strategies to improve functional outcomes Baseline: Completed short term HEP, advanced to More advanced program. Goal status: IN PROGRESS  2.  Patient will improve FOTO score to predicted of 59%value to indicate improvement in functional outcomes Baseline: 44; (  11/7 - 46) Goal status: IN PROGRESS  3.  Patient will report 75% improvement in symptoms to progress towards PLOF. Baseline: 85% Goal status: Goal MET  4. Patient will have equal to or > 4+/5 MMT throughout BIL LEs to improve ability to perform functional mobility, stair ambulation and ADLs.  Baseline: See above (hip ER still shy of goal) Goal status: IN PROGRESS  5. Patient will improve 5 time sit to stand < 12.6 sec to demonstrate significant improvement in functional strength with transfers. Baseline: 13.2 sec (11/7 8.6 sec no hands) Goal status: MET   PLAN: PT FREQUENCY: 2x/week  PT DURATION: 4 weeks  PLANNED INTERVENTIONS: Therapeutic exercises, Therapeutic activity, Neuromuscular re-education, Balance training, Gait training, Patient/Family education, Self Care, Joint mobilization, Joint manipulation, Stair training, DME instructions, Dry Needling, Electrical stimulation, Spinal manipulation, Spinal mobilization, Cryotherapy, Moist heat, Taping, Traction, Ultrasound, Biofeedback, Ionotophoresis 38m/ml Dexamethasone, Manual therapy, and Re-evaluation  PLAN FOR NEXT SESSION:   Progressive strengthening of Rt LE proximal muscles, and core. (Has some lumbar symptoms overlapping Rt hip).  Work on pMicrobiologistand balance.  CIhor Austin LPTA/CLT; CDelana [phone] 2:41 PM, 12/12/21

## 2021-12-13 ENCOUNTER — Other Ambulatory Visit: Payer: Self-pay | Admitting: *Deleted

## 2021-12-13 DIAGNOSIS — I83811 Varicose veins of right lower extremities with pain: Secondary | ICD-10-CM

## 2021-12-19 ENCOUNTER — Encounter (HOSPITAL_COMMUNITY): Payer: Self-pay | Admitting: Physical Therapy

## 2021-12-19 ENCOUNTER — Encounter (HOSPITAL_COMMUNITY): Payer: Medicare HMO | Admitting: Physical Therapy

## 2021-12-19 ENCOUNTER — Ambulatory Visit (HOSPITAL_COMMUNITY): Payer: Medicare HMO | Attending: Physician Assistant | Admitting: Physical Therapy

## 2021-12-19 DIAGNOSIS — M6281 Muscle weakness (generalized): Secondary | ICD-10-CM | POA: Insufficient documentation

## 2021-12-19 DIAGNOSIS — M25551 Pain in right hip: Secondary | ICD-10-CM | POA: Insufficient documentation

## 2021-12-19 NOTE — Therapy (Signed)
OUTPATIENT PHYSICAL THERAPY LOWER EXTREMITY TREATMENT   Patient Name: LENNAN MALONE MRN: [id number] DOB:10/12/1945, 76 y.o., female Today's Date: 12/19/2021  PHYSICAL THERAPY DISCHARGE SUMMARY  Visits from Start of Care: 10  Current functional level related to goals / functional outcomes: See below   Remaining deficits: See below   Education / Equipment: See assessment   Patient agrees to discharge. Patient goals were partially met. Patient is being discharged due to being pleased with the current functional level.  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   Osteopenia    Raynaud's disease    Raynaud'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 01/27/2016   Rosacea 01/27/2016   Varicose veins    Vitamin D deficiency 01/27/2016   Past Surgical History:  Procedure Laterality Date   ABDOMINAL HYSTERECTOMY     CESAREAN SECTION     CHOLECYSTECTOMY     COLONOSCOPY  2023   COLONOSCOPY WITH PROPOFOL N/A 09/29/2021   Procedure: COLONOSCOPY WITH PROPOFOL;  Surgeon: Carol Ada, MD;  Location: [facility];  Service: Gastroenterology;  Laterality: N/A;   HEMOSTASIS CLIP PLACEMENT  09/29/2021   Procedure: HEMOSTASIS CLIP PLACEMENT;  Surgeon: Carol Ada, MD;  Location: [facility];  Service: Gastroenterology;;   INTESTINAL BLOCKAGE      POLYPECTOMY  09/29/2021   Procedure: POLYPECTOMY;  Surgeon: Carol Ada, MD;  Location: [facility];  Service: Gastroenterology;;   TONSILLECTOMY     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 Feels remaining deficits are due to arthritis. She is taking medicine for this. Her elbow still bothers her. She is seeing specialist for this. She feels she may be ready for DC from therapy today.   Pt stated she feels her back bothered her more than hip during Thanksgiving events.  No reports of pain currently.  Reports stairs  are getting easier but continues to feel weak as well as balance.  Stated some difficulty lifting objects from floor.    PERTINENT HISTORY: See above  PAIN:  Are you having pain? Yes: NPRS scale:  0/10 Pain location: right hip Pain description: Sore Aggravating factors: walking, stairs, standing Relieving factors: lying flat, seated  PRECAUTIONS: None  WEIGHT BEARING RESTRICTIONS No  FALLS:  Has patient fallen in last 6 months? No  LIVING ENVIRONMENT: Lives  with: lives with their family and lives with their spouse Lives in: House/apartment Stairs: Yes: Internal: 13 steps; on right going up and on left going up and External: 2 steps; on right going up, on left going up, and can reach both Has following equipment at home: None  OCCUPATION: Retired, hobbies - cook, yard work, reading  PLOF: Independent  PATIENT GOALS Decrease pain, improve walking, standing, and stair ability, stand long enough to cook   OBJECTIVE:   DIAGNOSTIC FINDINGS: n /a  PATIENT SURVEYS:  FOTO 61% function (was 44%)  COGNITION:  Overall cognitive status: Within functional limits for tasks assessed     SENSATION: WFL  PALPATION: TTP greater trochanter TrPs noted Rt glute max and medius  LOWER EXTREMITY ROM:  Active ROM Right eval Left eval  Hip flexion    Hip extension    Hip abduction    Hip adduction    Hip internal rotation 36 31  Hip external rotation 29 36  Knee flexion    Knee extension    Ankle dorsiflexion    Ankle plantarflexion    Ankle inversion    Ankle eversion     (Blank rows = not tested)  LUMBAR ROM:  Active ROM Eval 12/19/21  Flexion Distal 1/3 shin (P! Rt hip) WFL  Extension WFL 25% limited  Right Flexion Joint line WFL  Left Flexion Joint line    LOWER EXTREMITY MMT:  MMT Right eval Left eval Right 12/19/21 Left 12/19/21   Hip flexion _0 Hip extension 5 5 4+ 4+  Hip abduction 5 5 4+ 4+  Hip adduction 5 5    Hip internal rotation      Hip external rotation 4+ 4 4+ 4+  Knee flexion _1 Knee extension _2 Ankle dorsiflexion 5 5    Ankle plantarflexion      Ankle inversion      Ankle eversion       (Blank rows = not tested)  LOWER EXTREMITY SPECIAL TESTS:  Hip special tests: Saralyn Pilar (FABER) test: positive , Hip scouring test: positive , and Anterior hip impingement test: positive  (Right) Slump test + Rt (calf) SLR + Rt  FUNCTIONAL TESTS:  5 times sit to stand: 13.2 sec  11/7 - 8  sec  GAIT: Time limited   TODAY'S TREATMENT:  12/19/21 Reassess FOTO AROM MMT Hep review   12/12/21  Standing:  Squat 10x 2 sets  Proper lifting ball from 8in step 10x  6in power step up 6in 10x 5"  6in step down 10x  Vector 5x5" with 1 HHA  SLS 2" each BLE  Marching 10x 3-5" holds  Paloff 15x 2 with BTB  12/05/21  Standing 6" step up forward 2X10   6" lateral step up 2X10   Vectors 5X5'  Supine: clams with RTB resistance at same time 10X5"   Open Book stretch  PATIENT EDUCATION:  Education details: findings HEP, PT role, POC, modalities as needed Person educated:  Patient Education method: Explanation Education comprehension: verbalized understanding   HOME EXERCISE PROGRAM: Access Code: [email or web address] URL: [email or web address]  12/19/21 Access Code: [email or web address] URL: [email or web address] Date: 12/19/2021 Prepared by: Josue Hector  Exercises - Supine Bridge  - 1-2 x daily - 5 x weekly - 2 sets - 10 reps - 5 seconds hold - Supine Transversus Abdominis Bracing - Hands on Stomach  - 1-2 x daily - 5 x weekly - 3 sets - 10 reps - Supine Piriformis Stretch with Foot on Ground  - 1-2 x daily - 5 x weekly - 3 sets - 15-30 hold - Supine Figure 4 Piriformis Stretch (Mirrored)  - 1-2 x daily - 5 x weekly - 3 sets - 15-30 sec hold - Supine Single Knee to Chest Stretch  - 1-2 x daily - 5 x weekly - 1 sets - 3 reps - 30" hold - Supine Hamstring Stretch  - 1-2 x daily - 5 x weekly - 1 sets - 3 reps - 30" hold - Clamshell  - 1-2 x daily - 5 x weekly - 1 sets - 10 reps - 3-5" hold - Sidelying Hip Abduction  - 1-2 x daily - 5 x weekly - 1 sets - 10 reps - 3-5"  hold - Prone Hip Extension - One Pillow  - 1-2 x daily - 5 x weekly - 3 sets - 10 reps - ITB Stretch at Wall (Mirrored)  - 1-2 x daily - 5 x weekly - 1 sets - 3 reps - 30 sec hold - Hooklying Clamshell with Resistance  - 1-2 x daily - 5 x weekly - 1 sets - 10 reps - 5 sec hold - Sidelying  Thoracic Rotation with Open Book  - 1-2 x daily - 5 x weekly - 1 sets - 10 reps - 5 sec hold - Squat with Chair Touch  - 1-2 x daily - 5 x weekly - 2 sets - 10 reps - 3" hold - Hip Abduction with Resistance Loop  - 1-2 x daily - 5 x weekly - 2 sets - 10 reps - Hip Extension with Resistance Loop  - 1-2 x daily - 5 x weekly - 2 sets - 10 reps Date: 10/23/2021 Prepared by: Candie Mile 11/15/2021 - Supine Single Knee to Chest Stretch  - 2 x daily - 7 x weekly - 1 sets - 3 reps - 30" hold - Supine Hamstring Stretch  - 2 x daily - 7 x weekly - 1 sets - 3 reps - 30" hold - Clamshell  - 1 x daily - 7 x weekly - 1 sets - 10 reps - 3-5" hold - Sidelying Hip Abduction  - 1 x daily - 7 x weekly - 1 sets - 10 reps - 3-5"  hold - Prone Hip Extension - One Pillow  - 1 x daily - 7 x weekly - 3 sets - 10 reps Exercises - Supine Bridge  - 2 x daily - 7 x weekly - 2 sets - 10 reps - 5 seconds hold - Supine Transversus Abdominis Bracing - Hands on Stomach  - 3 x daily - 7 x weekly - 3 sets - 10 reps - Supine Piriformis Stretch with Foot on Ground  - 3 x daily - 7 x weekly - 3 sets - 15-30 hold - Supine Figure 4 Piriformis Stretch (Mirrored)  - 3 x daily - 7 x weekly - 3 sets - 15-30 sec hold  Access Code: [email or web address] URL: [email or web address] Date: 11/23/2021 Prepared by:  Amy Mare Ferrari Exercises - ITB Stretch at [employer])  - 2 x daily - 7 x weekly - 1 sets - 3 reps - 30 sec hold    Access Code: [email or web address] URL: [email or web address] Date: 12/05/2021 Prepared by: [place] with Resistance  - 2 x daily - 7 x weekly - 1 sets - 10 reps - 5 sec hold - Sidelying Thoracic Rotation with Open Book  - 2 x daily - 7 x weekly - 1 sets - 10 reps - 5 sec hold Vectors 5X5" each LE with 1 HHA  12/12/21: Squat- chair tapping  ASSESSMENT:  CLINICAL IMPRESSION: Patient shows good progress and has currently partially MET therapy goals. Patient shows good  Improvement in strength. Good improvement in pain free AROM. Significant Improvement in subjective function reported. Patient does still have slight ongoing fascial restriction and tenderness about RT trochanter. She is currently managing symptoms with prescribed medication. Patient feels ready for DC at this time to continue with independent home program. Reviewed HEP and answered all patient questions. Patient encouraged to follow up with therapy services with any further questions or concerns.   OBJECTIVE IMPAIRMENTS Abnormal gait, decreased activity tolerance, decreased knowledge of condition, decreased mobility, difficulty walking, decreased ROM, decreased strength, hypomobility, increased fascial restrictions, increased muscle spasms, impaired flexibility, improper body mechanics, and pain.   ACTIVITY LIMITATIONS carrying, lifting, bending, sitting, standing, squatting, sleeping, stairs, transfers, bed mobility, and locomotion level  PARTICIPATION LIMITATIONS: meal prep, cleaning, laundry, shopping, community activity, and yard work  PERSONAL FACTORS Age, Past/current experiences, and Time since onset of injury/illness/exacerbation are also affecting patient's functional outcome.   REHAB POTENTIAL: Good  CLINICAL DECISION MAKING: Stable/uncomplicated  EVALUATION COMPLEXITY: Low   GOALS: Goals reviewed with patient? Yes  SHORT TERM GOALS: Target date: 11/06/21  Patient will be independent with initial HEP and self-management strategies to improve functional outcomes Baseline: Initiated Goal status: MET  2. Eliminate myofascial trigger points and TTP of Rt glute and greater trochanter.   Baseline: Min TTP with direct pressure applied  Goal status: Not MET  LONG TERM GOALS: Target date: 11/20/21  Patient will be independent with advanced HEP and self-management strategies to improve functional outcomes Baseline: Reviewed HEP and answered all questions Goal status: MET  2.   Patient will improve FOTO score to predicted of 59% value to indicate improvement in functional outcomes Baseline: 61%  Goal status: MET  3.  Patient will report 75% improvement in symptoms to progress towards PLOF. Baseline: 85% Goal status: MET  4. Patient will have equal to or > 4+/5 MMT throughout BIL LEs to improve ability to perform functional mobility, stair ambulation and ADLs.  Baseline: See MMT Goal status: MET  5. Patient will improve 5 time sit to stand < 12.6 sec to demonstrate significant improvement in functional strength with transfers. Baseline: 8.6 sec no hands  Goal status: MET   PLAN: PT FREQUENCY: 2x/week  PT DURATION: 4 weeks  PLANNED INTERVENTIONS: Therapeutic exercises, Therapeutic activity, Neuromuscular re-education, Balance training, Gait training, Patient/Family education, Self Care, Joint mobilization, Joint manipulation, Stair training, DME instructions, Dry Needling, Electrical stimulation, Spinal manipulation, Spinal mobilization, Cryotherapy, Moist heat, Taping, Traction, Ultrasound, Biofeedback, Ionotophoresis 40m/ml Dexamethasone, Manual therapy, and Re-evaluation  PLAN FOR NEXT SESSION:  Dc to HEP   2:51 PM, 12/19/21 CJosue HectorPT DPT  Physical Therap[facility] ([phone]

## 2021-12-20 ENCOUNTER — Telehealth: Payer: Self-pay | Admitting: Rheumatology

## 2021-12-20 NOTE — Telephone Encounter (Unsigned)
Patient left a voicemail stating she has a couple of questions about her Forteo medication and requested to speak with the pharmacist.

## 2021-12-21 ENCOUNTER — Other Ambulatory Visit: Payer: Self-pay | Admitting: *Deleted

## 2021-12-21 ENCOUNTER — Encounter (HOSPITAL_COMMUNITY): Payer: Medicare HMO

## 2021-12-21 DIAGNOSIS — M810 Age-related osteoporosis without current pathological fracture: Secondary | ICD-10-CM

## 2021-12-21 NOTE — Telephone Encounter (Signed)
Patient  Knox Saliva, PharmD, MPH, BCPS, CPP Clinical Pharmacist (Rheumatology and Pulmonology)

## 2021-12-21 NOTE — Telephone Encounter (Signed)
Patient returned call and states [facility] did confirm they'd be able to send her a replacement Forteo pen.   Knox Saliva, PharmD, MPH, BCPS, CPP Clinical Pharmacist (Rheumatology and Pulmonology)

## 2021-12-21 NOTE — Telephone Encounter (Signed)
[facility] called, patient needs replacement and refill on meds. Please review RX and send. Not sure on dose. Thank you.

## 2021-12-21 NOTE — Progress Notes (Signed)
Lauren Knox D.[place] Phone: [phone]   Assessment and Plan:     1. Chronic right-sided thoracic back pain 2. Chronic bilateral low back pain without sciatica 3. Somatic dysfunction of lumbar region 4. Somatic dysfunction of pelvic region 5. Somatic dysfunction of sacral region -Chronic exacerbation, subsequent visit - Overall patient's chronic low back pain has improved with course of meloxicam, HEP, continue physical therapy - Patient is having decreased flares of pain and decreased spasming, however she still continues to have daily back pain that worsens gradually throughout the day especially during physically active days - Will plan on transitioning away from frequent NSAID use at this time and instead can use Tylenol for day-to-day pain relief - Start Tylenol 500 to 1000 mg tablets 2-3 times a day for day-to-day pain relief - Patient has received significant relief with OMT in the past.  Elects for repeat OMT today.  Tolerated well per note below. - Decision today to treat with OMT was based on Physical Exam   After verbal consent patient was treated with HVLA (high velocity low amplitude), ME (muscle energy), FPR (flex positional release), ST (soft tissue), PC/PD (Pelvic Compression/ Pelvic Decompression) techniques in sacral, lumbar, and pelvic areas. Patient tolerated the procedure well with improvement in symptoms.  Patient educated on potential side effects of soreness and recommended to rest, hydrate, and use Tylenol as needed for pain control.  6. Right elbow pain  -Chronic with exacerbation, subsequent visit - Continued elbow pain that did not significantly improve with HEP and meloxicam - Patient was more concerned with continued back pain, so elbow was not the focus of today's visit.  We could obtain x-ray at follow-up visit  Pertinent previous records reviewed include orthopedic note 11/29/2021    Follow Up: 3 to 4 weeks for reevaluation.  If Tylenol and as needed NSAIDs are insufficient in controlling patient's pain, she can call back and we can prescribe a prednisone Dosepak to help get her through the holidays.  At follow-up visit, would obtain x-ray of right elbow and lumbar spine to further evaluate.  If back pain is not well-controlled could consider advanced imaging.   Subjective:   I, Lauren Knox, am serving as a Education administrator for Lauren Knox   Chief Complaint: back pain    HPI:    11/07/2021 female who presents today for acute on chronic right-sided low back and mid back pain with intermittent spasms. Lauren Knox states that over the last year or 2 she has intermittently had this right-sided mid back pain.  Usually it would go away on its own, however over the last month this has been exacerbated and has been associated with intermittent spasms.  This is limiting her ability to do physical therapy for the hip.  Feels like a shooting and burning pain that goes up and down the right mid back.  She does take tizanidine at nighttime which does help control the pain and spasms occasionally.  Occasionally will take ibuprofen as needed.  No radicular symptoms of the upper or lower extremities.  She gets some pain at night when trying to lie down, pain is worse with twisting and bending about the thoracic spine as well.   11/09/2021 Patient is a 76 year old female complaining of back pain. Patient states that all summer she has felt a hot poker poking her , pcp said it looks like her muscles are so tight she moves her body  it causes pain , she had shots in the back thoracic pain doc told her it would last a week , anything she did would aggravate her , was also seen  by rheumatology    12/01/2021 Patient states that she is okay , needs a tune up   12/22/2021 Patient states intermittent pain with the back, pain hits in the afternoon , feels like hr muscle are bundling up  hips is  doing fine    Relevant Historical Information: Osteoporosis, Raynaud's  Additional pertinent review of systems negative.  Current Outpatient Medications  Medication Sig Dispense Refill   amLODipine (NORVASC) 5 MG tablet Take 5 mg by mouth daily.  12   Calcium Carb-Cholecalciferol (CALCIUM 600/VITAMIN D3 PO) Take 1 tablet by mouth daily.     cholecalciferol (VITAMIN D) 1000 units tablet Take 1,000 Units by mouth daily.     Insulin Pen Needle 31G X 5 MM MISC Use to inject Forteo into the skin once daily. DISCARD AFTER USE. DO NOT RESUSE 100 each 2   meloxicam (MOBIC) 15 MG tablet Take 1 tablet (15 mg total) by mouth daily. 30 tablet 0   methocarbamol (ROBAXIN) 500 MG tablet TAKE 1 TABLET(500 MG) BY MOUTH DAILY AS NEEDED FOR MUSCLE SPASMS 30 tablet 0   methylphenidate (RITALIN) 10 MG tablet Take 10-15 mg by mouth daily as needed (Focus).     metroNIDAZOLE (METROCREAM) 0.75 % cream Apply 1 Application topically every morning.  4   naproxen (NAPROSYN) 500 MG tablet Take 1 tablet (500 mg total) by mouth 2 (two) times daily as needed for mild pain or moderate pain. 30 tablet 0   olmesartan (BENICAR) 20 MG tablet Take 20 mg by mouth daily.     Probiotic Product (PRO-BIOTIC BLEND PO) Take 1 capsule by mouth daily.     Teriparatide, Recombinant, (FORTEO) 600 MCG/2.4ML SOPN Inject 20 mcg into the skin daily. 7.2 mL 0   tiZANidine (ZANAFLEX) 2 MG tablet Take 2 mg by mouth every 8 (eight) hours as needed for muscle spasms.     tretinoin (RETIN-A) 0.025 % cream Apply 1 application  topically at bedtime.  4   No current facility-administered medications for this visit.      Objective:     Vitals:   12/22/21 1109  BP: 124/80  Weight: 135 lb (61.2 kg)  Height: '5\' 4"'$  (1.626 m)      Body mass index is 23.17 kg/m.    Physical Exam:     General: Well-appearing, cooperative, sitting comfortably in no acute distress.   OMT Physical Exam:  ASIS Compression Test: Positive Right Sacral: Positive  sphinx, TTP bilateral sacral base Lumbar: TTP paraspinal, limited rotation through lumbar spine Pelvis: Right anterior innominate  Electronically signed by:  Lauren Knox D.[facility] 11:56 AM 12/22/21

## 2021-12-21 NOTE — Telephone Encounter (Signed)
Patient reached out and said she forgot to place Forteo back in the refrigerator after using it. She started a new pen to prevent interruption in therapy.  I advised her to reach out to [facility] and request replacement.  Knox Saliva, PharmD, MPH, BCPS, CPP Clinical Pharmacist (Rheumatology and Pulmonology)

## 2021-12-22 ENCOUNTER — Ambulatory Visit: Payer: Medicare HMO | Admitting: Sports Medicine

## 2021-12-22 VITALS — BP 124/80 | Ht 64.0 in | Wt 135.0 lb

## 2021-12-22 DIAGNOSIS — M545 Low back pain, unspecified: Secondary | ICD-10-CM

## 2021-12-22 DIAGNOSIS — M546 Pain in thoracic spine: Secondary | ICD-10-CM | POA: Diagnosis not present

## 2021-12-22 DIAGNOSIS — M25521 Pain in right elbow: Secondary | ICD-10-CM

## 2021-12-22 DIAGNOSIS — M9903 Segmental and somatic dysfunction of lumbar region: Secondary | ICD-10-CM

## 2021-12-22 DIAGNOSIS — M9904 Segmental and somatic dysfunction of sacral region: Secondary | ICD-10-CM

## 2021-12-22 DIAGNOSIS — M9905 Segmental and somatic dysfunction of pelvic region: Secondary | ICD-10-CM | POA: Diagnosis not present

## 2021-12-22 DIAGNOSIS — G8929 Other chronic pain: Secondary | ICD-10-CM | POA: Diagnosis not present

## 2021-12-22 MED ORDER — TERIPARATIDE 600 MCG/2.4ML ~~LOC~~ SOPN: 20.0000 ug | PEN_INJECTOR | Freq: Every day | SUBCUTANEOUS | 0 refills | Status: DC

## 2021-12-22 NOTE — Telephone Encounter (Signed)
Rx sent for Forteo to [facility] for replacement + 2 additional pens.  Patient aware of refill ordering process moving forward  Knox Saliva, PharmD, MPH, BCPS, CPP Clinical Pharmacist (Rheumatology and Pulmonology)

## 2021-12-22 NOTE — Patient Instructions (Addendum)
Good to see you Tylenol [phone] mg 2-3 times a day for pain relief  If tylenol does not help with pain call us back an we will prescribe a prednisone dos pak 3-4 week follow up

## 2022-01-19 ENCOUNTER — Ambulatory Visit: Payer: Medicare HMO | Admitting: Sports Medicine

## 2022-01-22 ENCOUNTER — Encounter: Payer: Self-pay | Admitting: Sports Medicine

## 2022-01-22 ENCOUNTER — Ambulatory Visit: Payer: Medicare HMO | Admitting: Sports Medicine

## 2022-01-22 DIAGNOSIS — M5134 Other intervertebral disc degeneration, thoracic region: Secondary | ICD-10-CM | POA: Diagnosis not present

## 2022-01-22 DIAGNOSIS — M25551 Pain in right hip: Secondary | ICD-10-CM | POA: Diagnosis not present

## 2022-01-22 DIAGNOSIS — M549 Dorsalgia, unspecified: Secondary | ICD-10-CM

## 2022-01-22 DIAGNOSIS — G2589 Other specified extrapyramidal and movement disorders: Secondary | ICD-10-CM

## 2022-01-22 DIAGNOSIS — G8929 Other chronic pain: Secondary | ICD-10-CM

## 2022-01-22 MED ORDER — MELOXICAM 15 MG PO TABS: 15.0000 mg | ORAL_TABLET | Freq: Every day | ORAL | 1 refills | Status: DC

## 2022-01-22 NOTE — Progress Notes (Signed)
Lauren Knox - 77 y.o. female MRN [id number]  Date of birth: 04/30/1945  Office Visit Note: Visit Date: 01/22/2022 PCP: Asencion Noble, MD Referred by: Asencion Noble, MD  Subjective: Chief Complaint  Patient presents with   Lower Back - Follow-up   HPI: Lauren Knox is a pleasant 77 y.o. female who presents today for follow-up of back pain.  She has had improvements on her right-sided thoracic back pain, however here over the last few weeks she feels like she has had a flare of her pain.  Pain is located in the middle of the back, does radiate to the right side of the spine just inferior to the scapula.  Denies any overlying skin changes.  She has been doing formalized physical therapy which has helped with pain and posture.  She did her second OMT session and states she did not think the second 1 helped as much as the first.  She was taking either Naprosyn or meloxicam, but stopped this because she had to take a lot of medicine for a diverticulitis flareup.  Continues to work on some home exercises as well.  Has any weakness of the upper extremities, no low back pain or radicular symptoms.  She has made great strides in her greater trochanteric pain.  Improved with therapy.  This is not bothering her as much today.   We did perform trigger point injections over the back on 11/07/2021 which did provide temporary improvement.  Pertinent ROS were reviewed with the patient and found to be negative unless otherwise specified above in HPI.   Assessment & Plan: Visit Diagnoses:  1. DDD (degenerative disc disease), thoracic   2. Scapular dyskinesis   3. Mid back pain, chronic   4. 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  Review of her x-rays show anterior bridging, I am concerned this could possibly be ankylosing spondylitis versus bridging and  osteophyte formation from simple underlying DJD.  She has made progress with short-term improvement in pain and function with formalized physical therapy, oral medications as well as OMT but her pain continues to be remitting.  Given this, I would like to obtain an MRI of the thoracic spine to evaluate for any signs or symptoms of AS her other underlying pathology such as stenosis, etc.  Will discontinue Naprosyn, we will get her started back on meloxicam 15 mg to be taken once daily with food.  She will continue her therapy and her home exercises to help with improving range of motion as well as her scapular dyskinesis.  The future, we can always consider repeat trigger point injections over the scapular musculature if she desires.  She will follow-up 3 days after obtaining MRI. Reassuring her hip is feeling better, and continue home exercises as she needs.   Follow-up: Return for F/u 3 business days after back imaging .   Meds & Orders:  Meds ordered this encounter  Medications   meloxicam (MOBIC) 15 MG tablet    Sig: Take 1 tablet (15 mg total) by mouth daily.    Dispense:  30 tablet    Refill:  1    Orders Placed This Encounter  Procedures   MR Thoracic Spine w/o contrast     Procedures: No procedures performed      Clinical History: No specialty comments available.  She reports that she  has never smoked. She has been exposed to tobacco smoke. She has never used smokeless tobacco. No results for input(s): "HGBA1C", "LABURIC" in the last 8760 hours.  Objective:    Physical Exam  Gen: Well-appearing, in no acute distress; non-toxic CV: Well-perfused. Warm.  Resp: Breathing unlabored on room air; no wheezing. Psych: Fluid speech in conversation; appropriate affect; normal thought process Neuro: Sensation intact throughout. No gross coordination deficits.   Ortho Exam - Spine: There is thoracic kyphosis noted.  No significant midline spinous process TTP.  There is some TTP near the  mid thoracic spine from about T8-T11 on the right side near the transverse process and over the right sided paraspinal musculature.  There is some slightly limitation in extension, no pain with flexion.  Full strength of the upper extremities.   - Right scapula: + Trigger points over the superior medial border of the scapula musculature.  There is still a slight degree of scapular dysfunction with the right scapula moving abnormally compared to the left with arm and shoulder range of motion.  No gross scapular winging.  - Right hip: TTP over the greater trochanteric region.  No blocks to internal or external passive range of motion.  Imaging:  2 views of the thoracic spine including AP and lateral films were ordered  and reviewed by myself.  Radiographs demonstrate no significant scoliosis,  although there is at least moderate kyphosis of the thoracic spine.  There  is anterior bridging with osteophytes of T9-T10 and T10-T11.  No evidence  of compression fracture or acute fracture.   Past Medical/Family/Surgical/Social History: Medications & Allergies reviewed per EMR, new medications updated. 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   Muscle weakness (generalized) 09/17/2013   Scoliosis concern 09/17/2013   Stiffness of joint, not elsewhere classified, pelvic region and thigh 09/17/2013   Difficulty in walking(719.7) 09/17/2013   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   Osteopenia     Raynaud's disease    Raynaud'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 01/27/2016   Rosacea 01/27/2016   Varicose veins    Vitamin D deficiency 01/27/2016   Family History  Problem Relation Age of Onset   Cancer Mother    Cancer Father    Heart attack Father    Breast cancer Sister    Cancer Brother    Past Surgical History:  Procedure Laterality Date   ABDOMINAL HYSTERECTOMY     CESAREAN SECTION     CHOLECYSTECTOMY     COLONOSCOPY  2023   COLONOSCOPY WITH PROPOFOL N/A 09/29/2021   Procedure: COLONOSCOPY WITH PROPOFOL;  Surgeon: Carol Ada, MD;  Location: [facility];  Service: Gastroenterology;  Laterality: N/A;   HEMOSTASIS CLIP PLACEMENT  09/29/2021   Procedure: HEMOSTASIS CLIP PLACEMENT;  Surgeon: Carol Ada, MD;  Location: [facility];  Service: Gastroenterology;;   INTESTINAL BLOCKAGE     POLYPECTOMY  09/29/2021   Procedure: POLYPECTOMY;  Surgeon: Carol Ada, MD;  Location: [facility];  Service: Gastroenterology;;   TONSILLECTOMY     Social History  Occupational History   Not on file  Tobacco Use   Smoking status: Never    Passive exposure: Past   Smokeless tobacco: Never  Vaping Use   Vaping Use: Never used  Substance and Sexual Activity   Alcohol use: Yes    Alcohol/week: 4.0 standard drinks of alcohol    Types: 4 Glasses of wine per week    Comment: occ   Drug use: No   Sexual activity: Not on file

## 2022-01-22 NOTE — Progress Notes (Signed)
She states the pain in the afternoon is the worst especially when she is up on her feet all day  Denies any OTC meds for pain Had manipulation done by Dr. Glennon Mac, stated it helped some but now the pain is back

## 2022-02-01 ENCOUNTER — Other Ambulatory Visit: Payer: Self-pay | Admitting: *Deleted

## 2022-02-01 MED ORDER — LORAZEPAM 1 MG PO TABS: ORAL_TABLET | ORAL | 0 refills | Status: AC

## 2022-02-07 ENCOUNTER — Encounter: Payer: Self-pay | Admitting: Vascular Surgery

## 2022-02-07 ENCOUNTER — Ambulatory Visit: Payer: Medicare HMO | Admitting: Vascular Surgery

## 2022-02-07 VITALS — BP 110/59 | HR 58 | Temp 98.3°F | Resp 14 | Ht 64.0 in | Wt 133.0 lb

## 2022-02-07 DIAGNOSIS — I872 Venous insufficiency (chronic) (peripheral): Secondary | ICD-10-CM | POA: Diagnosis not present

## 2022-02-07 DIAGNOSIS — I83811 Varicose veins of right lower extremities with pain: Secondary | ICD-10-CM | POA: Diagnosis not present

## 2022-02-07 HISTORY — PX: ENDOVENOUS ABLATION SAPHENOUS VEIN W/ LASER: SUR449

## 2022-02-07 NOTE — Progress Notes (Signed)
Patient name: Lauren Knox MRN: [id number] DOB: March 29, 1945 Sex: female  REASON FOR VISIT: For laser ablation of the right great saphenous vein and 10-20 stabs  HPI: Lauren Knox is a 77 y.o. female who I last saw on 11/29/2021 with painful varicose veins of the right lower extremity.  She had C2 venous disease.  She failed conservative treatment and I felt that she was a candidate for laser ablation of the right great saphenous vein with 10-20 stabs.  The vein becomes superficial in the distal thigh so I felt that I would likely cannulate the vein at the junction of the mid and distal thigh.  Current Outpatient Medications  Medication Sig Dispense Refill   amLODipine (NORVASC) 5 MG tablet Take 5 mg by mouth daily.  12   Calcium Carb-Cholecalciferol (CALCIUM 600/VITAMIN D3 PO) Take 1 tablet by mouth daily.     cholecalciferol (VITAMIN D) 1000 units tablet Take 1,000 Units by mouth daily.     Insulin Pen Needle 31G X 5 MM MISC Use to inject Forteo into the skin once daily. DISCARD AFTER USE. DO NOT RESUSE 100 each 2   LORazepam (ATIVAN) 1 MG tablet Take 1 tablet 30 minutes before leaving house on day of office surgery.  Bring second tablet with you to office on day of office surgery. 2 tablet 0   meloxicam (MOBIC) 15 MG tablet Take 1 tablet (15 mg total) by mouth daily. 30 tablet 1   methocarbamol (ROBAXIN) 500 MG tablet TAKE 1 TABLET(500 MG) BY MOUTH DAILY AS NEEDED FOR MUSCLE SPASMS 30 tablet 0   methylphenidate (RITALIN) 10 MG tablet Take 10-15 mg by mouth daily as needed (Focus).     metroNIDAZOLE (METROCREAM) 0.75 % cream Apply 1 Application topically every morning.  4   olmesartan (BENICAR) 20 MG tablet Take 20 mg by mouth daily.     Probiotic Product (PRO-BIOTIC BLEND PO) Take 1 capsule by mouth daily.     Teriparatide, Recombinant, (FORTEO) 600 MCG/2.4ML SOPN Inject 20 mcg into the skin daily. 7.2 mL 0   tiZANidine (ZANAFLEX) 2 MG tablet Take 2 mg by mouth every 8 (eight)  hours as needed for muscle spasms.     tretinoin (RETIN-A) 0.025 % cream Apply 1 application  topically at bedtime.  4   No current facility-administered medications for this visit.    PHYSICAL EXAM: Vitals:   02/07/22 1036  BP: (!) 110/59  Pulse: (!) 58  Resp: 14  Temp: 98.3 F (36.8 C)  TempSrc: Temporal  SpO2: 100%  Weight: 133 lb (60.3 kg)  Height: '5\' 4"'$  (1.626 m)    PROCEDURE: Laser ablation of the right great saphenous vein with 10-20 stabs  TECHNIQUE: The patient was taken to the exam room and the dilated varicose veins were marked with the patient standing.  Patient was then placed supine.  I looked at the right great saphenous vein myself with the SonoSite and I felt that I could cannulate this in the distal thigh.  The right leg was prepped and draped in usual sterile fashion.  Under ultrasound guidance, after the skin was anesthetized, I cannulated the right great saphenous vein in the distal thigh with a micropuncture needle and a micropuncture sheath was introduced over a wire.  I then advanced a J-wire to just below the saphenofemoral junction.  Of note the saphenous vein was markedly dilated just distal to the saphenofemoral junction.  Tumescent anesthesia was then administered circumferentially around the vein.  Patient was  placed in Trendelenburg.  Of note the patient had a jerking motion of her leg throughout the entire case.  The laser fiber was positioned approximately 3 cm distal to the saphenofemoral junction.  Laser ablation was performed of the right great saphenous vein down to the distal thigh.  50 J/cm was used at 52 W.  Attention was then turned to stab phlebectomies.  All the marked areas were anesthetized with tumescent anesthesia.  Using approximately 18 small stab incisions the veins were "brought above the skin and then grasped with a hemostat and bluntly excised.  Pressure was held for hemostasis.  Steri-Strips were applied.  A pressure dressing was applied.   Patient tolerated the procedure well.  She will return in 1 week for follow-up duplex.  Deitra Mayo [facility] [phone]

## 2022-02-07 NOTE — Progress Notes (Signed)
Laser Ablation Procedure    Date: 02/07/2022   Lauren Knox DOB:1945-03-11  Consent signed: Yes      Surgeon: Gae Gallop MD   Procedure: Laser Ablation: right Greater Saphenous Vein  BP (!) 110/59 (BP Location: Left Arm, Patient Position: Sitting, Cuff Size: Normal)   Pulse (!) 58   Temp 98.3 F (36.8 C) (Temporal)   Resp 14   Ht '5\' 4"'$  (1.626 m)   Wt 133 lb (60.3 kg)   SpO2 100%   BMI 22.83 kg/m   Tumescent Anesthesia: 700 cc 0.9% NaCl with 50 cc Lidocaine HCL 1%  and 15 cc 8.4% NaHCO3  Local Anesthesia: 10 cc Lidocaine HCL and NaHCO3 (ratio 2:1)  7 watts continuous mode     Total energy: 1389 Joules    Total time: 198 seconds  Treatment Length  30 cm   Laser Fiber Ref. #  [id number]    Lot # [id number]   Stab Phlebectomy: 10-20 Sites: Calf  Patient tolerated procedure well  Notes: All staff members wore facial masks.  [name] took Ativan 1 mg (1 tablet) on 02-07-2022 at 8:30 AM.    Description of Procedure:  After marking the course of the secondary varicosities, the patient was placed on the operating table in the supine position, and the right leg was prepped and draped in sterile fashion.   Local anesthetic was administered and under ultrasound guidance the saphenous vein was accessed with a micro needle and guide wire; then the mirco puncture sheath was placed.  A guide wire was inserted saphenofemoral junction , followed by a 5 french sheath.  The position of the sheath and then the laser fiber below the junction was confirmed using the ultrasound.  Tumescent anesthesia was administered along the course of the saphenous vein using ultrasound guidance. The patient was placed in Trendelenburg position and protective laser glasses were placed on patient and staff, and the laser was fired at 7 watts continuous mode for a total of 1389 Joules joules.   For stab phlebectomies, local anesthetic was administered at the previously marked varicosities, and  tumescent anesthesia was administered around the vessels.  Ten to 20 stab wounds were made using the tip of an 11 blade. And using the vein hook, the phlebectomies were performed using a hemostat to avulse the varicosities.  Adequate hemostasis was achieved.     Steri strips were applied to the stab wounds and ABD pads and thigh high compression stockings were applied.  Ace wrap bandages were applied over the phlebectomy sites and at the top of the saphenofemoral junction. Blood loss was less than 15 cc.  Discharge instructions reviewed with patient and hardcopy of discharge instructions given to patient to take home. The patient was taken out of the operating room having tolerated the procedure well via wheelchair to her car.

## 2022-02-14 ENCOUNTER — Encounter: Payer: Self-pay | Admitting: Vascular Surgery

## 2022-02-14 ENCOUNTER — Ambulatory Visit (INDEPENDENT_AMBULATORY_CARE_PROVIDER_SITE_OTHER): Payer: Medicare HMO | Admitting: Vascular Surgery

## 2022-02-14 ENCOUNTER — Ambulatory Visit (HOSPITAL_COMMUNITY)
Admission: RE | Admit: 2022-02-14 | Discharge: 2022-02-14 | Disposition: A | Discharge status: Home / Self Care | Payer: Medicare HMO | Source: Ambulatory Visit | Attending: Vascular Surgery | Admitting: Vascular Surgery

## 2022-02-14 VITALS — BP 122/72 | HR 55 | Temp 98.3°F | Resp 16 | Ht 64.0 in | Wt 131.0 lb

## 2022-02-14 DIAGNOSIS — I83811 Varicose veins of right lower extremities with pain: Secondary | ICD-10-CM | POA: Diagnosis not present

## 2022-02-14 NOTE — Progress Notes (Signed)
Patient name: FELISHIA WARTMAN MRN: [id number] DOB: 1945/02/21 Sex: female  REASON FOR VISIT: Follow-up after laser ablation of the right great saphenous vein with 10-20 stabs  HPI: BIBI ECONOMOS is a 77 y.o. female who had presented with painful varicose veins of the right lower extremity.  She had failed conservative treatment and was felt to be a good candidate for laser ablation of the right great saphenous vein with 10-20 stabs.  She had C2 venous disease.  Overall, she is doing well and has no specific complaints.  She states that her right leg feels much better since the procedure.  Current Outpatient Medications  Medication Sig Dispense Refill   amLODipine (NORVASC) 5 MG tablet Take 5 mg by mouth daily.  12   Calcium Carb-Cholecalciferol (CALCIUM 600/VITAMIN D3 PO) Take 1 tablet by mouth daily.     cholecalciferol (VITAMIN D) 1000 units tablet Take 1,000 Units by mouth daily.     Insulin Pen Needle 31G X 5 MM MISC Use to inject Forteo into the skin once daily. DISCARD AFTER USE. DO NOT RESUSE 100 each 2   meloxicam (MOBIC) 15 MG tablet Take 1 tablet (15 mg total) by mouth daily. 30 tablet 1   methocarbamol (ROBAXIN) 500 MG tablet TAKE 1 TABLET(500 MG) BY MOUTH DAILY AS NEEDED FOR MUSCLE SPASMS 30 tablet 0   methylphenidate (RITALIN) 10 MG tablet Take 10-15 mg by mouth daily as needed (Focus).     metroNIDAZOLE (METROCREAM) 0.75 % cream Apply 1 Application topically every morning.  4   olmesartan (BENICAR) 20 MG tablet Take 20 mg by mouth daily.     Probiotic Product (PRO-BIOTIC BLEND PO) Take 1 capsule by mouth daily.     Teriparatide, Recombinant, (FORTEO) 600 MCG/2.4ML SOPN Inject 20 mcg into the skin daily. 7.2 mL 0   tiZANidine (ZANAFLEX) 2 MG tablet Take 2 mg by mouth every 8 (eight) hours as needed for muscle spasms.     tretinoin (RETIN-A) 0.025 % cream Apply 1 application  topically at bedtime.  4   LORazepam (ATIVAN) 1 MG tablet Take 1 tablet 30 minutes before  leaving house on day of office surgery.  Bring second tablet with you to office on day of office surgery. (Patient not taking: Reported on 02/14/2022) 2 tablet 0   No current facility-administered medications for this visit.   REVIEW OF SYSTEMS: Valu.Nieves ] denotes positive finding; [  ] denotes negative finding  CARDIOVASCULAR:  '[ ]'$  chest pain   '[ ]'$  dyspnea on exertion  '[ ]'$  leg swelling  CONSTITUTIONAL:  '[ ]'$  fever   '[ ]'$  chills  PHYSICAL EXAM: Vitals:   02/14/22 1016  BP: 126/80  Pulse: 69  Resp: 16  Temp: 98.3 F (36.8 C)  TempSrc: Temporal  SpO2: 100%  Weight: 182 lb (82.6 kg)  Height: '5\' 10"'$  (1.778 m)   GENERAL: The patient is a well-nourished female, in no acute distress. The vital signs are documented above. CARDIOVASCULAR: There is a regular rate and rhythm. PULMONARY: There is good air exchange bilaterally without wheezing or rales. VASCULAR: She has minimal bruising in the right calf.  Her stab incisions are healing nicely.  Most of her Steri-Strips are now off.  DATA:  VENOUS DUPLEX: I have independently interpreted her venous duplex scan today.  There is no evidence of DVT in the right lower extremity.  The right great saphenous vein is successfully closed from the distal thigh to within 9 mm of the saphenofemoral junction  MEDICAL ISSUES:  S/P LASER ABLATION RIGHT GREAT SAPHENOUS VEIN WITH 10-20 STABS: Patient is doing well status post laser ablation of the right great saphenous vein.  She has 1 more week with her thigh-high compression stocking.  We have discussed the importance of daily leg elevation.  I encouraged her to continue to exercise.  She is not having significant symptoms in the left leg currently.  I will see her as needed.  Deitra Mayo [facility] [place] [phone]

## 2022-02-19 ENCOUNTER — Telehealth: Payer: Self-pay

## 2022-02-19 NOTE — Telephone Encounter (Signed)
Called patient to discuss and she is requesting trigger point injections. She was offered an appointment this week and declined due to a schedule conflict. Patient is now scheduled for 02/28/2022.

## 2022-02-19 NOTE — Telephone Encounter (Signed)
Pt called req an appt due to neck pain. Stated that she was wanting to sched an appt for an injection due to the stiff neck pain. I stated that this is something I would need clinical to take a look at and that we will contact to let her know. She wanted something either today or tomorrow even if possible.

## 2022-02-20 NOTE — Progress Notes (Deleted)
Office Visit Note  Patient: Lauren Knox             Date of Birth: May 17, 1945           MRN: [id number]             PCP: Asencion Noble, MD Referring: Asencion Noble, MD Visit Date: 02/28/2022 Occupation: '@GUAROCC'$ @  Subjective:  No chief complaint on file.   History of Present Illness: Lauren Knox is a 77 y.o. female ***     Activities of Daily Living:  Patient reports morning stiffness for *** {minute/hour:19697}.   Patient {ACTIONS;DENIES/REPORTS:21021675::"Denies"} nocturnal pain.  Difficulty dressing/grooming: {ACTIONS;DENIES/REPORTS:21021675::"Denies"} Difficulty climbing stairs: {ACTIONS;DENIES/REPORTS:21021675::"Denies"} Difficulty getting out of chair: {ACTIONS;DENIES/REPORTS:21021675::"Denies"} Difficulty using hands for taps, buttons, cutlery, and/or writing: {ACTIONS;DENIES/REPORTS:21021675::"Denies"}  No Rheumatology ROS completed.   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   Muscle weakness (generalized) 09/17/2013   Scoliosis concern 09/17/2013   Stiffness of joint, not elsewhere classified, pelvic region and thigh 09/17/2013   Difficulty in walking(719.7) 09/17/2013    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   Osteopenia    Raynaud's disease    Raynaud'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 01/27/2016   Rosacea 01/27/2016   Varicose veins    Vitamin D deficiency 01/27/2016    Family History  Problem Relation Age of Onset   Cancer  Mother    Cancer Father    Heart attack Father    Breast cancer Sister    Cancer Brother    Past Surgical History:  Procedure Laterality Date   ABDOMINAL HYSTERECTOMY     CESAREAN SECTION     CHOLECYSTECTOMY     COLONOSCOPY  2023   COLONOSCOPY WITH PROPOFOL N/A 09/29/2021   Procedure: COLONOSCOPY WITH PROPOFOL;  Surgeon: Carol Ada, MD;  Location: [facility];  Service: Gastroenterology;  Laterality: N/A;   ENDOVENOUS ABLATION SAPHENOUS VEIN W/ LASER Right 02/07/2022   endovenous laser ablation right greater saphenous vein and stab phlebectomy 10-20 incisions right leg by Gae Gallop MD   HEMOSTASIS CLIP PLACEMENT  09/29/2021   Procedure: HEMOSTASIS CLIP PLACEMENT;  Surgeon: Carol Ada, MD;  Location: [facility];  Service: Gastroenterology;;   INTESTINAL BLOCKAGE     POLYPECTOMY  09/29/2021   Procedure: POLYPECTOMY;  Surgeon: Carol Ada, MD;  Location: [facility];  Service: Gastroenterology;;   TONSILLECTOMY     Social History   Social History Narrative   Not on file   Immunization History  Administered Date(s) Administered   PFIZER(Purple Top)SARS-COV-2 Vaccination 02/21/2019, 03/14/2019, 11/19/2019     Objective: Vital Signs: There were no vitals taken for this visit.   Physical Exam   Musculoskeletal Exam: ***  CDAI Exam: CDAI Score: -- Patient Global: --; Provider Global: -- Swollen: --; Tender: -- Joint Exam 02/28/2022   No joint exam has been documented for this visit   There is currently no information documented on the homunculus. Go to the Rheumatology activity and complete the homunculus joint exam.  Investigation: No additional findings.  Imaging: VAS [place] LOWER  EXTREMITY VENOUS POST ABLATION  Result Date: 02/14/2022  Lower Venous Reflux Study Patient Name:  Lauren Knox  Date of Exam:   02/14/2022 Medical Rec #: [id number]           Accession #:    [id number] Date of Birth: 1945-07-02           Patient Gender: F Patient Age:   41  years Exam Location:  [facility] Procedure:      VAS [place] LOWER EXTREMITY VENOUS POST ABLATION Referring Phys: Harrell Gave DICKSON --------------------------------------------------------------------------------  Indications: Right post ablation evaluation. Right great saphenous vein ablation 02/07/2022  Performing Technologist: Ronal Fear RVS, RCS  Examination Guidelines: A complete evaluation includes B-mode imaging, spectral Doppler, color Doppler, and power Doppler as needed of all accessible portions of each vessel. Bilateral testing is considered an integral part of a complete examination. Limited examinations for reoccurring indications may be performed as noted. The reflux portion of the exam is performed with the patient in reverse Trendelenburg. Significant venous reflux is defined as >500 ms in the superficial venous system, and >1 second in the deep venous system.  Venous Reflux Times +---------+---------+------+-----------+------------+--------+ RIGHT    Reflux NoRefluxReflux TimeDiameter cmsComments                    Yes                                  +---------+---------+------+-----------+------------+--------+ CFV                                            Patent   +---------+---------+------+-----------+------------+--------+ FV mid                                         Patent   +---------+---------+------+-----------+------------+--------+ Popliteal                                      Patent   +---------+---------+------+-----------+------------+--------+   Summary: Right: - No evidence of deep vein thrombosis from the common femoral through the popliteal veins. - Successful ablation of the right great saphenous vein from the distal thigh up to 0.91 cm from the saphenofemoral junction.  *See table(s) above for measurements and observations. Electronically signed by Deitra Mayo MD on 02/14/2022 at 10:23:16 AM.    Final      Recent Labs: Lab Results  Component Value Date   WBC 9.9 11/01/2021   HGB 15.4 11/01/2021   PLT 471 (H) 11/01/2021   NA 139 11/01/2021   K 4.5 11/01/2021   CL 102 11/01/2021   CO2 27 11/01/2021   GLUCOSE 100 (H) 11/01/2021   BUN 20 11/01/2021   CREATININE 0.88 11/01/2021   BILITOT 0.8 11/01/2021   AST 17 11/01/2021   ALT 8 11/01/2021   PROT 7.3 11/01/2021   CALCIUM 10.7 (H) 11/01/2021   GFRAA 67 11/27/2019    Speciality Comments: Reclast - 04/30/16, 11/13/19,11/17/20 Forteo started 11/16/21  Procedures:  No procedures performed Allergies: Codeine and Lisinopril   Assessment / Plan:     Visit Diagnoses: No diagnosis found.  Orders: No orders of the defined  types were placed in this encounter.  No orders of the defined types were placed in this encounter.   Face-to-face time spent with patient was *** minutes. Greater than 50% of time was spent in counseling and coordination of care.  Follow-Up Instructions: No follow-ups on file.   Earnestine Mealing, CMA  Note - This record has been created using Editor, commissioning.  Chart creation errors have been sought, but may not always  have been located. Such creation errors do not reflect on  the standard of medical care.

## 2022-02-22 ENCOUNTER — Ambulatory Visit (HOSPITAL_COMMUNITY): Payer: Medicare HMO

## 2022-02-22 ENCOUNTER — Telehealth: Payer: Self-pay | Admitting: Sports Medicine

## 2022-02-22 NOTE — Telephone Encounter (Signed)
Having neck spasms and going down into the shoulders Had MRI scheduled for today; but cancelled it per radiology request  She is going to keep appointment Monday to be seen for neck issue; trigger point pain.

## 2022-02-22 NOTE — Telephone Encounter (Signed)
Patient states she need to speak to dr. Rolena Infante assistant. ASAP

## 2022-02-26 ENCOUNTER — Ambulatory Visit (INDEPENDENT_AMBULATORY_CARE_PROVIDER_SITE_OTHER): Payer: Medicare HMO

## 2022-02-26 ENCOUNTER — Encounter: Payer: Self-pay | Admitting: Sports Medicine

## 2022-02-26 ENCOUNTER — Ambulatory Visit: Payer: Medicare HMO | Admitting: Sports Medicine

## 2022-02-26 DIAGNOSIS — M542 Cervicalgia: Secondary | ICD-10-CM | POA: Diagnosis not present

## 2022-02-26 DIAGNOSIS — M47812 Spondylosis without myelopathy or radiculopathy, cervical region: Secondary | ICD-10-CM

## 2022-02-26 DIAGNOSIS — M5134 Other intervertebral disc degeneration, thoracic region: Secondary | ICD-10-CM

## 2022-02-26 DIAGNOSIS — M4312 Spondylolisthesis, cervical region: Secondary | ICD-10-CM | POA: Diagnosis not present

## 2022-02-26 MED ORDER — LIDOCAINE HCL 1 % IJ SOLN
1.0000 mL | INTRAMUSCULAR | Status: AC | PRN
  Administered 2022-02-26: 1 mL

## 2022-02-26 MED ORDER — PREDNISONE 20 MG PO TABS: 20.0000 mg | ORAL_TABLET | Freq: Every day | ORAL | 0 refills | Status: AC

## 2022-02-26 MED ORDER — METHYLPREDNISOLONE ACETATE 40 MG/ML IJ SUSP
40.0000 mg | INTRAMUSCULAR | Status: AC | PRN
  Administered 2022-02-26: 40 mg via INTRAMUSCULAR

## 2022-02-26 NOTE — Progress Notes (Signed)
Lauren Knox - 77 y.o. female MRN [id number]  Date of birth: [birth year]  Office Visit Note: Visit Date: 02/26/2022 PCP: Asencion Noble, MD Referred by: Asencion Noble, MD  Subjective: Chief Complaint  Patient presents with  . Neck - Pain   HPI: Lauren Knox is a pleasant 77 y.o. female who presents today for neck pain and right trapezius/upper back pain.  Lauren Knox presents with ongoing upper thoracic pain, but with more newer onset of cervical neck pain over the last 3 weeks.  She states about 3 weeks ago she had a vascular surgery for varicose veins and was required to lay in bed for longer periods of time with her legs elevated.  Since then her neck has really been bothering her over the posterior and right side of the neck.  She has done home stretches, massage with tennis ball, heat and ice without much relief.  She does take the Robaxin which helps her sleep at times, however her pain has been rather bothersome and interfering with her sleep.  She denies any radicular symptoms or numbness tingling going down into the arms.  She had been on meloxicam previously, but then was transition to ibuprofen after her vascular surgery.  She has since stopped these as it has been upsetting her stomach.  Due to the pain and concern for motion artifact, she had to reschedule her thoracic MRI, this new scan is scheduled for March 1.  Pertinent ROS were reviewed with the patient and found to be negative unless otherwise specified above in HPI.   Assessment & Plan: Visit Diagnoses:  1. Spondylosis of cervical region without myelopathy or radiculopathy   2. Cervical pain   3. DDD (degenerative disc disease), thoracic   4. Anterolisthesis of cervical spine   5. Trigger point of neck    Plan: Discussed and reviewed markers previous imaging of the cervical spine which show arthritic change throughout the spine and quite severe facet arthritic change and hypertrophy.  She is dealing with some  trigger points of the neck and paraspinal hypertonicity, likely from her position change from her recent surgery.  In the past she has responded well to trigger point injections, through shared decision making elected to proceed with these today.  Recommended heat as well as massage.  Given the degree of her pain, we will start a low-dose of prednisone 20 mg for 6 days only.  She may continue with the Robaxin in the evenings at nighttime to help with her muscle tightness/spasms.  We discussed having her see one of my spine partners, Dr. Laurance Flatten, to evaluate for her underlying OA and anterolisthesis, we will hold off on this for now and see how she responds.  She will follow-up in 2 weeks for reevaluation.  If she is doing much improved, she may cancel this and we will follow-up after her thoracic MRI.  Follow-up: Return in about 2 weeks (around 03/12/2022) for for follow-up of neck pain (possible trigger point inj).   Meds & Orders:  Meds ordered this encounter  Medications  . predniSONE (DELTASONE) 20 MG tablet    Sig: Take 1 tablet (20 mg total) by mouth daily with breakfast for 6 days.    Dispense:  6 tablet    Refill:  0    Orders Placed This Encounter  Procedures  . Trigger Point Inj  . XR Cervical Spine 2 or 3 views     Procedures: Trigger Point Inj  Date/Time: 02/26/2022 11:29 AM  Performed  by: Elba Barman, DO Authorized by: Elba Barman, DO   Consent Given by:  Patient Site marked: the procedure site was marked   Timeout: prior to procedure the correct patient, procedure, and site was verified   Indications:  Pain and muscle spasm Total # of Trigger Points:  3 or more Location: neck and shoulder   Needle Size:  25 G Approach:  Dorsal Medications #1:  1 mL lidocaine 1 %; 40 mg methylPREDNISolone acetate 40 MG/ML Medications #2:  1 mL lidocaine 1 % Medications #3:  1 mL lidocaine 1 % Additional Injections?: No   Patient tolerance:  Patient tolerated the procedure well with no  immediate complications Comments: Procedure: Trigger point injections, Neck and Right Trapezius After discussion on R/B/I and informed verbal consent was obtained, a timeout was conducted. The patient was placed in a prone position (and then seated for R-neck/paraspinal) on the examination table and the areas of maximal tenderness was identified over the paraspinal musculature of cervical spine and right trapezius.  This area was cleansed with multiple alcohol swabs. Ethyl chloride was used for local anesthesia. Using a 25-gauge 1.0 inch needle the trigger point(s) was subsequently injected with a mixture of 1 cc of methylprednisolone 40 mg/mL and 3 cc of 1% lidocaine without epinephrine, with a total of 1.3 cc of injectate into each trigger point. A band-aid was applied following. Patient tolerated procedure well, there were no post-injection complications. Post-procedure instructions were given.        Clinical History: No specialty comments available.  She reports that she has never smoked. She has been exposed to tobacco smoke. She has never used smokeless tobacco. No results for input(s): "HGBA1C", "LABURIC" in the last 8760 hours.  Objective:    Physical Exam  Gen: Well-appearing, in no acute distress; non-toxic CV: Well-perfused. Warm.  Resp: Breathing unlabored on room air; no wheezing. Psych: Fluid speech in conversation; appropriate affect; normal thought process Neuro: Sensation intact throughout. No gross coordination deficits.   Ortho Exam - Neck: There is a mildly exaggerated cervical lordosis.  No midline spinous process TTP.  There is positive TTP over the right-sided cervical paraspinal musculature with right SCM hypertonicity as well.  Positive trigger points in the right cervical paraspinal as well as the right trapezius and superior border of the scapula.  There is full range of motion of bilateral upper extremities, grip strength intact bilaterally without weakness.  There is  restriction in cervical extension as well as associated pain with this.  Imaging: XR Cervical Spine 2 or 3 views  Result Date: 02/26/2022 2 views of the cervical spine including AP and lateral femoral ordered and reviewed by myself.  There is spondylosis noted throughout the entire of the cervical spine.  There is an exaggerated cervical lordosis with widespread mild to moderate anterior listhesis distending down the cervical spine.  There is severe facet arthropathy in the C4-C6 level as well as at least moderate facet arthropathy from the mid to lower cervical spine.  C7 on T1 and T1 on T2 anterolisthesis as well. No acute fracture noted.   Independent review of cervical MRI from 11/19/2017 was independently reviewed by myself.  There is widespread descending mild cervical anterolisthesis.  There is moderate to severe facet hypertrophy throughout the mid and lower aspect of the cervical spine.  No significant foraminal stenosis, no spinal cord impingement with patent spinal canal.  *MRI Cervical Spine 11/19/2017:  Narrative & Impression  CLINICAL DATA:  77 year old female with neck pain  radiating to the left arm for 3 weeks. No known injury.   EXAM: MRI CERVICAL SPINE WITHOUT CONTRAST   TECHNIQUE: Multiplanar, multisequence MR imaging of the cervical spine was performed. No intravenous contrast was administered.   COMPARISON:  Cervical spine radiographs 08/20/2017.   FINDINGS: Alignment: Stable exaggerated cervical lordosis. Subtle anterolisthesis throughout the cervical spine appears stable and degenerative in nature.   Vertebrae: No marrow edema or evidence of acute osseous abnormality. Mildly heterogeneous background bone marrow signal. Degenerative endplate marrow changes at T1-T2.   Cord: Spinal cord signal is within normal limits at all visualized levels.   Posterior Fossa, vertebral arteries, paraspinal tissues: Cervicomedullary junction is within normal limits. Negative  visible brain parenchyma. Preserved major vascular flow voids. Negative neck soft tissues. Negative visible lung apices.   Disc levels:   C2-C3: Moderate left facet hypertrophy. Mild left C3 foraminal stenosis.   C3-C4: Moderate facet hypertrophy greater on the left. Trace degenerative facet joint fluid. Borderline to mild left C4 foraminal stenosis.   C4-C5: Mild anterolisthesis at this level with moderate to severe facet and ligament flavum hypertrophy greater on the left. Degenerative facet joint fluid. No spinal stenosis. Borderline to mild left C5 foraminal stenosis.   C5-C6: Anterolisthesis with moderate to severe facet and ligament flavum hypertrophy greater on the left. Trace degenerative facet joint fluid. Mild disc space loss and disc bulging. No spinal stenosis. Mild bilateral C6 foraminal stenosis.   C6-C7: Moderate facet hypertrophy. Mild circumferential disc bulge. No stenosis.   C7-T1: Moderate to severe facet hypertrophy greater on the right. Mild disc bulge. Mild to moderate bilateral C8 foraminal stenosis.   T1-T2: Moderate facet hypertrophy greater on the right. Mild disc bulge. Mild to moderate right T1 foraminal stenosis.   IMPRESSION: 1. Exaggerated cervical lordosis with widespread mild anterolisthesis in the cervical spine. Generalized cervical facet degeneration, up to severe. Comparatively mild disc degeneration, primarily in the lower cervical spine. 2. No cervical spinal stenosis. Generally mild cervical neural foraminal stenosis, up to moderate at the bilateral C8 nerve levels. 3. Disc and facet degeneration at T1-T2 with mild to moderate right T1 foraminal stenosis.     Electronically Signed   By: Genevie Ann M.D.   On: 11/19/2017 14:27    Past Medical/Family/Surgical/Social History: Medications & Allergies reviewed per EMR, new medications updated. Patient Active Problem List   Diagnosis Date Noted  . 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 ADD (attention deficit disorder) 01/27/2016  . Rosacea 01/27/2016  . Vitamin D deficiency 01/27/2016  . 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 Muscle weakness (generalized) 09/17/2013  . Scoliosis concern 09/17/2013  . Stiffness of joint, not elsewhere classified, pelvic region and thigh 09/17/2013  . Difficulty in walking(719.7) 09/17/2013   Past Medical History:  Diagnosis Date  . ADD (attention deficit disorder) 01/27/2016  . 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 Hypertension   . 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 Osteopenia   . Raynaud's disease   . 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 Rosacea 01/27/2016  . Varicose veins   . Vitamin D deficiency 01/27/2016   Family History  Problem Relation Age of Onset  . Cancer Mother   . Cancer Father   . Heart attack Father   . Breast cancer Sister   . Cancer Brother    Past Surgical History:  Procedure Laterality Date  . ABDOMINAL HYSTERECTOMY    . CESAREAN SECTION    . CHOLECYSTECTOMY    . COLONOSCOPY  2023  . COLONOSCOPY WITH PROPOFOL N/A 09/29/2021   Procedure: COLONOSCOPY WITH PROPOFOL;  Surgeon: Carol Ada, MD;  Location: [facility];  Service: Gastroenterology;  Laterality: N/A;  . ENDOVENOUS ABLATION SAPHENOUS VEIN W/ LASER Right 02/07/2022   endovenous laser ablation right greater saphenous vein and stab phlebectomy 10-20 incisions right leg by Gae Gallop MD  . HEMOSTASIS CLIP PLACEMENT  09/29/2021   Procedure: HEMOSTASIS CLIP PLACEMENT;  Surgeon: Carol Ada, MD;  Location: [facility];  Service: Gastroenterology;;  . INTESTINAL BLOCKAGE    . POLYPECTOMY  09/29/2021   Procedure: POLYPECTOMY;  Surgeon: Carol Ada, MD;  Location: [facility];  Service: Gastroenterology;;  . TONSILLECTOMY      Social History   Occupational History  . Not on file  Tobacco Use  . Smoking status: Never    Passive exposure: Past  . Smokeless tobacco: Never  Vaping Use  . Vaping Use: Never used  Substance and Sexual Activity  . Alcohol use: Yes    Alcohol/week: 4.0 standard drinks of alcohol    Types: 4 Glasses of wine per week    Comment: occ  . Drug use: No  . Sexual activity: Not on file

## 2022-02-26 NOTE — Progress Notes (Signed)
2+ weeks of pain Pain does go into arms Has had trigger injections in upper back/neck previously with Devashwar which have helped: interested in that if possible today

## 2022-02-28 ENCOUNTER — Ambulatory Visit: Payer: Medicare HMO | Admitting: Physician Assistant

## 2022-02-28 DIAGNOSIS — M62838 Other muscle spasm: Secondary | ICD-10-CM

## 2022-02-28 DIAGNOSIS — M5136 Other intervertebral disc degeneration, lumbar region: Secondary | ICD-10-CM

## 2022-02-28 DIAGNOSIS — E559 Vitamin D deficiency, unspecified: Secondary | ICD-10-CM

## 2022-02-28 DIAGNOSIS — M4005 Postural kyphosis, thoracolumbar region: Secondary | ICD-10-CM

## 2022-02-28 DIAGNOSIS — I73 Raynaud'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: Secondary | ICD-10-CM

## 2022-02-28 DIAGNOSIS — M7061 Trochanteric bursitis, right hip: Secondary | ICD-10-CM

## 2022-02-28 DIAGNOSIS — M5134 Other intervertebral disc degeneration, thoracic region: Secondary | ICD-10-CM

## 2022-02-28 DIAGNOSIS — M19071 Primary osteoarthritis, right ankle and foot: Secondary | ICD-10-CM

## 2022-02-28 DIAGNOSIS — M5030 Other cervical disc degeneration, unspecified cervical region: Secondary | ICD-10-CM

## 2022-02-28 DIAGNOSIS — M810 Age-related osteoporosis without current pathological fracture: Secondary | ICD-10-CM

## 2022-02-28 DIAGNOSIS — M19042 Primary osteoarthritis, left hand: Secondary | ICD-10-CM

## 2022-02-28 DIAGNOSIS — Z872 Personal history of diseases of the skin and subcutaneous tissue: Secondary | ICD-10-CM

## 2022-02-28 DIAGNOSIS — Z8659 Personal history of other mental and behavioral disorders: Secondary | ICD-10-CM

## 2022-03-01 ENCOUNTER — Telehealth: Payer: Self-pay | Admitting: Pharmacist

## 2022-03-01 NOTE — Telephone Encounter (Signed)
Patient called stated that she took her Forteo pen for more than 28 days. No issues reported. Advised that she really won't have side effects but stability of medication isn't studied. Nothing further needed.  Knox Saliva, PharmD, MPH, BCPS, CPP Clinical Pharmacist (Rheumatology and Pulmonology)

## 2022-03-12 ENCOUNTER — Ambulatory Visit: Payer: Medicare HMO | Admitting: Sports Medicine

## 2022-03-16 ENCOUNTER — Ambulatory Visit (HOSPITAL_COMMUNITY): Payer: Medicare HMO

## 2022-03-24 ENCOUNTER — Other Ambulatory Visit: Payer: Self-pay | Admitting: Physician Assistant

## 2022-03-24 DIAGNOSIS — M810 Age-related osteoporosis without current pathological fracture: Secondary | ICD-10-CM

## 2022-03-26 NOTE — Telephone Encounter (Signed)
Next Visit: 05/10/2022  Last Visit: 11/01/2021  Last Fill: 12/22/2021  DX: Age-related osteoporosis without current pathological fracture   Current Dose per office note 11/01/2021: not discussed  Labs: 11/01/2021 Calcium is elevated-10.7. Vitamin D is WNL. Plt count is elevated. Rest of CBC WNL.   Okay to refill Forteo?

## 2022-03-28 ENCOUNTER — Other Ambulatory Visit: Payer: Self-pay | Admitting: Physician Assistant

## 2022-03-28 ENCOUNTER — Encounter: Payer: Self-pay | Admitting: Sports Medicine

## 2022-03-28 ENCOUNTER — Ambulatory Visit: Payer: Medicare HMO | Admitting: Sports Medicine

## 2022-03-28 DIAGNOSIS — M62838 Other muscle spasm: Secondary | ICD-10-CM | POA: Diagnosis not present

## 2022-03-28 DIAGNOSIS — M5134 Other intervertebral disc degeneration, thoracic region: Secondary | ICD-10-CM | POA: Diagnosis not present

## 2022-03-28 DIAGNOSIS — M5010 Cervical disc disorder with radiculopathy, unspecified cervical region: Secondary | ICD-10-CM

## 2022-03-28 DIAGNOSIS — M4722 Other spondylosis with radiculopathy, cervical region: Secondary | ICD-10-CM

## 2022-03-28 DIAGNOSIS — M542 Cervicalgia: Secondary | ICD-10-CM

## 2022-03-28 DIAGNOSIS — M810 Age-related osteoporosis without current pathological fracture: Secondary | ICD-10-CM

## 2022-03-28 MED ORDER — BUPIVACAINE HCL 0.25 % IJ SOLN
2.0000 mL | INTRAMUSCULAR | Status: AC | PRN
  Administered 2022-03-28: 2 mL

## 2022-03-28 MED ORDER — CELECOXIB 100 MG PO CAPS: 100.0000 mg | ORAL_CAPSULE | Freq: Two times a day (BID) | ORAL | 0 refills | Status: DC

## 2022-03-28 MED ORDER — METHYLPREDNISOLONE ACETATE 40 MG/ML IJ SUSP
40.0000 mg | INTRAMUSCULAR | Status: AC | PRN
  Administered 2022-03-28: 40 mg via INTRAMUSCULAR

## 2022-03-28 MED ORDER — LIDOCAINE HCL 1 % IJ SOLN
2.0000 mL | INTRAMUSCULAR | Status: AC | PRN
  Administered 2022-03-28: 2 mL

## 2022-03-28 NOTE — Progress Notes (Signed)
Lauren Knox - 77 y.o. female MRN [id number]  Date of birth: [birth year]  Office Visit Note: Visit Date: 03/28/2022 PCP: Asencion Noble, MD Referred by: Asencion Noble, MD  Subjective: Chief Complaint  Patient presents with   Neck - Pain   HPI: Lauren Knox is a pleasant 77 y.o. female who presents today for follow-up of neck pain and trapezius back pain.  Lauren Knox states she is not doing well.  She has had an exacerbation of her neck pain that radiates now to the left side of the neck.  She is having muscle spasms of the left trapezius that goes into the shoulder.  She is reporting radicular symptoms from the base of the neck going down over the anterior lateral aspect of the shoulder to the proximal deltoid.  She does not get numbness or tingling or radicular symptoms into the hands or fingers.  The pain is so bothersome that this is keeping her up at night.  She has been doing massage, Icy[employer], hot showers only with temporary relief.  About a month ago we did do a low-dose of prednisone and some trigger point injections which helped get rid of the majority of her pain, however it has come back.  She still continues to some pain over the mid aspect of the thoracic spine, although this is significantly less than the neck pain and radiating pain she is experiencing currently.  Has thoracic MRI on March 28th.   Pertinent ROS were reviewed with the patient and found to be negative unless otherwise specified above in HPI.   Assessment & Plan: Visit Diagnoses:  1. Cervical disc disorder with radiculopathy, unspecified cervical region   2. Cervical pain   3. Trapezius muscle spasm   4. DDD (degenerative disc disease), thoracic   5. Other spondylosis with radiculopathy, cervical region    Plan: Discussed with [name] that I have a very high suspicion that her radicular pain is coming from her cervical spine.  Previous x-rays did show a good amount of spondylosis with segmental  anterolisthesis extending down the cervical spine.  She certainly has trigger point and muscle hypertonicity/spasming, but I think this is a secondary effect to her underlying cervical spine.  I do think it is pertinent given her radicular symptoms and chronicity that we proceed with MRI of the cervical spine.  She has an MRI of the thoracic spine scheduled on March 28, hoping that we can pair both of these together and she can get both at the same time.  If radiology does make her choose, I think given her degree of symptoms and pain that the cervical MRI would be a higher priority than the thoracic at this time.  We did repeat trigger point injections today, she tolerated well.  We also will start her on Celebrex 100 mg to be taken twice daily for her pain.  She may continue her muscle relaxer at bedtime as well as heat.  We will send her to formalized physical therapy to work on postural changes, other treatment modalities such as dry needling or TENS unit to help with her muscle spasms and hypertonicity.  We will follow-up in 3 business days after MRI to discuss next steps.  This may include having her see one of my spine partners, Dr. Laurance Flatten.  Follow-up: Return for F/u in 3 days after MRI on 04/12/22 (4/1 or 4/2 appt) .   Meds & Orders:  Meds ordered this encounter  Medications   celecoxib (CELEBREX) 100  MG capsule    Sig: Take 1 capsule (100 mg total) by mouth 2 (two) times daily.    Dispense:  60 capsule    Refill:  0    Orders Placed This Encounter  Procedures   Trigger Point Inj   MR Cervical Spine w/o contrast   Ambulatory referral to Physical Therapy     Procedures: Trigger Point Inj  Date/Time: 03/28/2022 12:27 PM  Performed by: Elba Barman, DO Authorized by: Elba Barman, DO   Consent Given by:  Patient Site marked: the procedure site was marked   Timeout: prior to procedure the correct patient, procedure, and site was verified   Indications:  Muscle spasm and pain Total # of  Trigger Points:  3 or more Location: neck and shoulder   Needle Size:  25 G Approach:  Dorsal Medications #1:  2 mL lidocaine 1 %; 2 mL bupivacaine 0.25 % Medications #2:  40 mg methylPREDNISolone acetate 40 MG/ML Patient tolerance:  Patient tolerated the procedure well with no immediate complications Comments: Procedure: Trigger point injections, left trapezius and medial scapular border (3) After discussion on R/B/I and informed verbal consent was obtained, a timeout was conducted. The patient was placed in a prone position on the examination table and the area of maximal tenderness was identified over the left posterior neck/shoulder and trapezius area.  This area was cleansed with Betadine and multiple alcohol swabs. Ethyl chloride was used for local anesthesia. Using a 25-gauge 1.5 inch needle the trigger point(s) was subsequently injected with a mixture of 1 cc of methylprednisolone 40 mg/mL and 2 cc of 1% lidocaine and 2 cc of bupivicaine 0.25%, with a total of 1.75 cc of injectate into each trigger point. A band-aid was applied following. Patient tolerated procedure well, there were no post-injection complications. Post-procedure instructions were given.         Clinical History: No specialty comments available.  She reports that she has never smoked. She has been exposed to tobacco smoke. She has never used smokeless tobacco. No results for input(s): "HGBA1C", "LABURIC" in the last 8760 hours.  Objective:    Physical Exam  Gen: Well-appearing, in no acute distress; non-toxic CV: Well-perfused. Warm.  Resp: Breathing unlabored on room air; no wheezing. Psych: Fluid speech in conversation; appropriate affect; normal thought process Neuro: Sensation intact throughout. No gross coordination deficits.   Ortho Exam - Cervical: There is a mildly exaggerated cervical lordosis.  No midline spinous process TTP.  There is significant hypertonicity of the left greater than right trapezius  muscle.  Associated trigger points here.  Bilateral shoulder protraction.  There is full strength of the upper extremities bilaterally.  Intact grip strength.  Positive Spurling's test on the left.  There is some restriction in pain at endrange of physical extension.  - Left shoulder: There is some TTP noted over the posterior aspect of the shoulder and trapezius, no bony TTP.  She has full and active passive range of motion without bony restriction.  Negative drop arm test, negative empty can testing.  Very mild pain with resisted external rotation although there is no strength deficits.  Imaging:  XR Cervical Spine 2 or 3 views 2 views of the cervical spine including AP and lateral femoral ordered and  reviewed by myself.  There is spondylosis noted throughout the entire of  the cervical spine.  There is an exaggerated cervical lordosis with  widespread mild to moderate anterior listhesis distending down the  cervical spine.  There is  severe facet arthropathy in the C4-C6 level as  well as at least moderate facet arthropathy from the mid to lower cervical  spine.  C7 on T1 and T1 on T2 anterolisthesis as well. No acute fracture  noted.    Past Medical/Family/Surgical/Social History: Medications & Allergies reviewed per EMR, new medications updated. 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   Muscle weakness (generalized) 09/17/2013   Scoliosis concern 09/17/2013   Stiffness of joint, not elsewhere classified, pelvic region and thigh 09/17/2013   Difficulty in walking(719.7) 09/17/2013   Past Medical History:  Diagnosis Date   ADD (attention deficit disorder)  01/27/2016   DDD (degenerative disc disease), 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   Hypertension    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   Osteoarthritis of hands, 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   Osteopenia    Raynaud's disease    Raynaud'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 01/27/2016   Rosacea 01/27/2016   Varicose veins    Vitamin D deficiency 01/27/2016   Family History  Problem Relation Age of Onset   Cancer Mother    Cancer Father    Heart attack Father    Breast cancer Sister    Cancer Brother    Past Surgical History:  Procedure Laterality Date   ABDOMINAL HYSTERECTOMY     CESAREAN SECTION     CHOLECYSTECTOMY     COLONOSCOPY  2023   COLONOSCOPY WITH PROPOFOL N/A 09/29/2021   Procedure: COLONOSCOPY WITH PROPOFOL;  Surgeon: Carol Ada, MD;  Location: [facility];  Service: Gastroenterology;  Laterality: N/A;   ENDOVENOUS ABLATION SAPHENOUS VEIN W/ LASER Right 02/07/2022   endovenous laser ablation right greater saphenous vein and stab phlebectomy 10-20 incisions right leg by Gae Gallop MD   HEMOSTASIS CLIP PLACEMENT  09/29/2021   Procedure: HEMOSTASIS CLIP PLACEMENT;  Surgeon: Carol Ada, MD;  Location: [facility];  Service: Gastroenterology;;   INTESTINAL BLOCKAGE     POLYPECTOMY  09/29/2021   Procedure: POLYPECTOMY;  Surgeon: Carol Ada, MD;  Location: [facility];  Service: Gastroenterology;;   TONSILLECTOMY     Social History   Occupational History   Not on file  Tobacco Use   Smoking status: Never    Passive exposure: Past   Smokeless tobacco: Never  Vaping Use   Vaping Use: Never used  Substance and Sexual Activity   Alcohol use: Yes    Alcohol/week: 4.0 standard drinks of alcohol    Types: 4 Glasses of wine per week    Comment: occ   Drug use: No   Sexual activity: Not on file

## 2022-03-28 NOTE — Progress Notes (Signed)
Not doing much better States pain/spasm is going down the left side now

## 2022-03-29 ENCOUNTER — Telehealth: Payer: Self-pay | Admitting: Sports Medicine

## 2022-03-29 NOTE — Telephone Encounter (Signed)
Patient called in stating she was supposed to get another MRI per Rolena Infante but the order was sent to [facility] and not [facility] like the MRI she is getting on 03/28 she would like the order switched over and possibly notify them she would like it on the same day

## 2022-03-30 ENCOUNTER — Other Ambulatory Visit: Payer: Self-pay | Admitting: Physician Assistant

## 2022-03-30 DIAGNOSIS — M810 Age-related osteoporosis without current pathological fracture: Secondary | ICD-10-CM

## 2022-04-12 ENCOUNTER — Ambulatory Visit (HOSPITAL_COMMUNITY)
Admission: RE | Admit: 2022-04-12 | Discharge: 2022-04-12 | Disposition: A | Discharge status: Home / Self Care | Payer: Medicare HMO | Source: Ambulatory Visit | Attending: Sports Medicine | Admitting: Sports Medicine

## 2022-04-12 DIAGNOSIS — M5134 Other intervertebral disc degeneration, thoracic region: Secondary | ICD-10-CM | POA: Diagnosis present

## 2022-04-12 DIAGNOSIS — G8929 Other chronic pain: Secondary | ICD-10-CM | POA: Insufficient documentation

## 2022-04-12 DIAGNOSIS — M542 Cervicalgia: Secondary | ICD-10-CM | POA: Diagnosis present

## 2022-04-12 DIAGNOSIS — M549 Dorsalgia, unspecified: Secondary | ICD-10-CM | POA: Diagnosis present

## 2022-04-17 ENCOUNTER — Encounter: Payer: Self-pay | Admitting: Sports Medicine

## 2022-04-17 ENCOUNTER — Ambulatory Visit: Payer: Medicare HMO | Admitting: Sports Medicine

## 2022-04-17 DIAGNOSIS — M62838 Other muscle spasm: Secondary | ICD-10-CM

## 2022-04-17 DIAGNOSIS — M542 Cervicalgia: Secondary | ICD-10-CM

## 2022-04-17 DIAGNOSIS — M47812 Spondylosis without myelopathy or radiculopathy, cervical region: Secondary | ICD-10-CM

## 2022-04-17 DIAGNOSIS — M5134 Other intervertebral disc degeneration, thoracic region: Secondary | ICD-10-CM

## 2022-04-17 DIAGNOSIS — R293 Abnormal posture: Secondary | ICD-10-CM

## 2022-04-17 MED ORDER — METHYLPREDNISOLONE ACETATE 40 MG/ML IJ SUSP
40.0000 mg | INTRAMUSCULAR | Status: AC | PRN
Start: 2022-04-17 — End: 2022-04-17
  Administered 2022-04-17: 40 mg via INTRAMUSCULAR

## 2022-04-17 MED ORDER — LIDOCAINE HCL 1 % IJ SOLN
2.0000 mL | INTRAMUSCULAR | Status: AC | PRN
Start: 2022-04-17 — End: 2022-04-17
  Administered 2022-04-17: 2 mL

## 2022-04-17 NOTE — Progress Notes (Signed)
Mri follow up  Still having the neck pain Did say that the trigger injections helped Hoping to get another one today

## 2022-04-17 NOTE — Progress Notes (Signed)
Lauren Knox - 77 y.o. female MRN [id number]  Date of birth: 17-Dec-1945  Office Visit Note: Visit Date: 04/17/2022 PCP: Asencion Noble, MD Referred by: Asencion Noble, MD  Subjective: Chief Complaint  Patient presents with   Neck - Follow-up   HPI: Lauren Knox is a pleasant 77 y.o. female who presents today for follow-up of neck and left shoulder/trapezius pain.  Shina continues with both neck and left-sided shoulder/trapezius pain.  We have done a few sessions of trigger point injections with always relieve her pain, but this is only temporary.  In the past she has done massage as well as osteopathic manipulation which gives her temporary relief, however her pain returns.  She has been doing some physical therapy, although due to the busyness of the office, she has not been able to get in for a few weeks.  Her next upcoming appointment is on April 9.  We previously recommended doing TENS unit and dry needling over a period of a few weeks to see if this helps improve her pain.  She continues on Celebrex 100mg  BID with some relief; has taken Robaxin 500mg  in past.   Pertinent ROS were reviewed with the patient and found to be negative unless otherwise specified above in HPI.   Assessment & Plan: Visit Diagnoses:  1. Trigger point of neck   2. DDD (degenerative disc disease), thoracic   3. Spondylosis of cervical region without myelopathy or radiculopathy   4. Trapezius muscle spasm   5. Abnormal posture    Plan: Discussed with Benelli the nature of her neck and trapezius pain.  We did review her cervical and thoracic MRI which fortunately did not show any narrowing of the cord, neural impingement, significant stenosis or space-occupying lesions.  She does have notable facet arthropathy most notable in the C4-C6 level bilaterally, although left greater than right.  I do think more of her pain is postural and muscular given her excessive cervical lordosis and associated trapezius  spasm/tightness.  I would like her to have more scheduled physical therapy twice weekly utilizing TENS unit, dry needling and what ever modalities they also see fit.  She wants to start getting back into aquatic therapy at the [facility] which has given her relief in the past, did recommend this as well.  Through shared decision-making, did proceed with trigger point injections as she has tolerated these well in the past.  Would like her to work on postural changes and cervical isometrics.  She will continue Celebrex 100 mg twice daily.  I would like her to resume her Robaxin 500 mg in the p.m. only.  Discussed with Shada that based on her cervical MRI with facet hypertrophy, I do want my partner Dr. Ernestina Patches, to evaluate this and see if possibly cervical facet injections may be beneficial for her.  I will let her know if he feels this would be helpful.  She will follow-up in 2 months, otherwise may return or call sooner if any issues arise.  Follow-up: Return in about 2 months (around 06/17/2022) for 30-min appt for neck and trigger point injs.   Meds & Orders: No orders of the defined types were placed in this encounter.   Orders Placed This Encounter  Procedures   Trigger Point Inj     Procedures: Trigger Point Inj  Date/Time: 04/17/2022 12:24 PM  Performed by: Elba Barman, DO Authorized by: Elba Barman, DO   Consent Given by:  Patient Site marked: the procedure site was marked  Timeout: prior to procedure the correct patient, procedure, and site was verified   Indications:  Pain and muscle spasm Total # of Trigger Points:  3 or more Location: neck and shoulder   Needle Size:  27 G Approach:  Dorsal Medications #1:  2 mL lidocaine 1 %; 40 mg methylPREDNISolone acetate 40 MG/ML Medications #2:  2 mL lidocaine 1 % Medications #3:  2 mL lidocaine 1 % Additional Injections?: No   Patient tolerance:  Patient tolerated the procedure well with no immediate complications Comments: Procedure:  Trigger point injections, Neck and Left Trapezius After discussion on R/B/I and informed verbal consent was obtained, a timeout was conducted. The patient was placed in a prone position (and then seated for R-neck/paraspinal) on the examination table and the areas of maximal tenderness was identified over the paraspinal musculature of cervical spine and right trapezius.  This area was cleansed with multiple alcohol swabs. Ethyl chloride was used for local anesthesia. Using a 27-gauge 1.0 inch needle the trigger point(s) was subsequently injected with a mixture of 1 cc of methylprednisolone 40 mg/mL and 5 cc of 1% lidocaine without epinephrine, with a total of 2 cc of injectate into each trigger point (3 total). A band-aid was applied following. Patient tolerated procedure well, there were no post-injection complications. Post-procedure instructions were given.        Clinical History: No specialty comments available.  She reports that she has never smoked. She has been exposed to tobacco smoke. She has never used smokeless tobacco. No results for input(s): "HGBA1C", "LABURIC" in the last 8760 hours.  Objective:    Physical Exam  Gen: Well-appearing, in no acute distress; non-toxic CV:  Well-perfused. Warm.  Resp: Breathing unlabored on room air; no wheezing. Psych: Fluid speech in conversation; appropriate affect; normal thought process Neuro: Sensation intact throughout. No gross coordination deficits.   Ortho Exam - Cervical: There is exaggerated cervical lordosis.  No midline spinous process TTP.  There is significant hypertonicity of the left greater than right trapezius muscle.  Associated trigger points here.  Bilateral shoulder protraction.  There is full strength of the upper extremities bilaterally.  Intact grip strength.  Positive Spurling's test on the left.  There is some restriction in pain at endrange of physical extension.   - Left shoulder: No bony TTP.  She has full and active  passive range of motion without bony restriction.  Negative drop arm test, negative empty can testing.  Very mild pain with resisted external rotation although there is no strength deficits.  Imaging:  MR Cervical Spine w/o contrast CLINICAL DATA:  Chronic neck and mid back pain with spasms. Bilateral shoulder pain. No acute injury or prior relevant surgery.  EXAM: MRI CERVICAL AND THORACIC SPINE WITHOUT CONTRAST  TECHNIQUE: Multiplanar and multiecho pulse sequences of the cervical spine, to include the craniocervical junction and cervicothoracic junction, and the thoracic spine, were obtained without intravenous contrast.  COMPARISON:  Radiographs of the cervical and thoracic spine 02/26/2022 and 04/12/2022. MRI of the cervical spine 11/19/2017  FINDINGS: MRI CERVICAL SPINE FINDINGS  Alignment: Exaggerated cervical lordosis with stable minimal multilevel anterolisthesis. No focal angulation.  Vertebrae: No acute or suspicious osseous findings. Multilevel facet arthropathy.  Cord: Normal in signal and caliber.  Posterior Fossa, vertebral arteries, paraspinal tissues: Mild chronic small vessel ischemic changes within the brainstem.Bilateral vertebral artery flow voids. No significant paraspinal findings. Grossly stable mild left apical scarring.  Disc levels:  C2-3: The disc appears normal. Asymmetric left facet hypertrophy  contributing to minimal left foraminal narrowing appears unchanged. The spinal canal is widely patent.  C3-4: The disc appears normal. Stable left greater than right facet hypertrophy without resulting spinal stenosis or foraminal narrowing.  C4-5: Stable bilateral facet hypertrophy, advanced on the left. Minimal disc bulging and uncinate spurring. No spinal stenosis or significant foraminal narrowing.  C5-6: Stable mild loss of disc height with disc bulging, uncinate spurring and bilateral facet hypertrophy, worse on the left. No spinal stenosis.  Mild right-greater-than-left foraminal narrowing appears similar.  C6-7: Stable mild disc bulging, uncinate spurring and bilateral facet hypertrophy. No spinal stenosis or significant foraminal narrowing.  C7-T1: Bilateral facet hypertrophy. No significant spinal stenosis or nerve root encroachment.  MRI THORACIC SPINE FINDINGS  Alignment:  Physiologic.  Vertebrae: No acute or suspicious osseous findings.  Cord: The thoracic cord appears normal in signal and caliber.The conus medullaris extends to the mid L1 level.  Paraspinal and other soft tissues: No significant paraspinal abnormalities.  Disc levels:  The thoracic spinal canal is widely patent. There is mild disc bulging and endplate osteophyte formation throughout the thoracic spine. No significant disc herniation, foraminal narrowing or nerve root encroachment identified. Localizing images of the lumbar spine demonstrate mild multilevel spondylosis without evidence of high-grade spinal stenosis.  IMPRESSION: 1. No acute findings or explanation for the patient's symptoms in the cervical or thoracic spine. 2. Stable multilevel cervical spondylosis with disc bulging, uncinate spurring and facet hypertrophy as described. No significant spinal stenosis or nerve root encroachment. 3. Mild thoracic spondylosis without significant disc herniation, foraminal narrowing or nerve root encroachment. 4. No acute osseous findings. Normal appearance of the cervicothoracic cord.  Electronically Signed   By: Richardean Sale M.D.   On: 04/13/2022 18:55 MR Thoracic Spine w/o contrast CLINICAL DATA:  Chronic neck and mid back pain with spasms. Bilateral shoulder pain. No acute injury or prior relevant surgery.  EXAM: MRI CERVICAL AND THORACIC SPINE WITHOUT CONTRAST  TECHNIQUE: Multiplanar and multiecho pulse sequences of the cervical spine, to include the craniocervical junction and cervicothoracic junction, and the thoracic  spine, were obtained without intravenous contrast.  COMPARISON:  Radiographs of the cervical and thoracic spine 02/26/2022 and 04/12/2022. MRI of the cervical spine 11/19/2017  FINDINGS: MRI CERVICAL SPINE FINDINGS  Alignment: Exaggerated cervical lordosis with stable minimal multilevel anterolisthesis. No focal angulation.  Vertebrae: No acute or suspicious osseous findings. Multilevel facet arthropathy.  Cord: Normal in signal and caliber.  Posterior Fossa, vertebral arteries, paraspinal tissues: Mild chronic small vessel ischemic changes within the brainstem.Bilateral vertebral artery flow voids. No significant paraspinal findings. Grossly stable mild left apical scarring.  Disc levels:  C2-3: The disc appears normal. Asymmetric left facet hypertrophy contributing to minimal left foraminal narrowing appears unchanged. The spinal canal is widely patent.  C3-4: The disc appears normal. Stable left greater than right facet hypertrophy without resulting spinal stenosis or foraminal narrowing.  C4-5: Stable bilateral facet hypertrophy, advanced on the left. Minimal disc bulging and uncinate spurring. No spinal stenosis or significant foraminal narrowing.  C5-6: Stable mild loss of disc height with disc bulging, uncinate spurring and bilateral facet hypertrophy, worse on the left. No spinal stenosis. Mild right-greater-than-left foraminal narrowing appears similar.  C6-7: Stable mild disc bulging, uncinate spurring and bilateral facet hypertrophy. No spinal stenosis or significant foraminal narrowing.  C7-T1: Bilateral facet hypertrophy. No significant spinal stenosis or nerve root encroachment.  MRI THORACIC SPINE FINDINGS  Alignment:  Physiologic.  Vertebrae: No acute or suspicious osseous findings.  Cord: The  thoracic cord appears normal in signal and caliber.The conus medullaris extends to the mid L1 level.  Paraspinal and other soft tissues: No significant  paraspinal abnormalities.  Disc levels:  The thoracic spinal canal is widely patent. There is mild disc bulging and endplate osteophyte formation throughout the thoracic spine. No significant disc herniation, foraminal narrowing or nerve root encroachment identified. Localizing images of the lumbar spine demonstrate mild multilevel spondylosis without evidence of high-grade spinal stenosis.  IMPRESSION: 1. No acute findings or explanation for the patient's symptoms in the cervical or thoracic spine. 2. Stable multilevel cervical spondylosis with disc bulging, uncinate spurring and facet hypertrophy as described. No significant spinal stenosis or nerve root encroachment. 3. Mild thoracic spondylosis without significant disc herniation, foraminal narrowing or nerve root encroachment. 4. No acute osseous findings. Normal appearance of the cervicothoracic cord.  Electronically Signed   By: Richardean Sale M.D.   On: 04/13/2022 18:55    Past Medical/Family/Surgical/Social History: Medications & Allergies reviewed per EMR, new medications updated. 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   Muscle weakness (generalized) 09/17/2013   Scoliosis concern 09/17/2013   Stiffness of joint, not elsewhere classified, pelvic region and thigh 09/17/2013   Difficulty in walking(719.7) 09/17/2013   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   Osteopenia    Raynaud's disease    Raynaud's  syndrome without gangrene 01/27/2016   Rosacea 01/27/2016   Varicose veins    Vitamin D deficiency 01/27/2016   Family History  Problem Relation Age of Onset   Cancer Mother    Cancer Father    Heart attack Father    Breast cancer Sister    Cancer Brother    Past Surgical History:  Procedure Laterality Date   ABDOMINAL HYSTERECTOMY     CESAREAN SECTION     CHOLECYSTECTOMY     COLONOSCOPY  2023   COLONOSCOPY WITH PROPOFOL N/A 09/29/2021   Procedure: COLONOSCOPY WITH PROPOFOL;  Surgeon: Carol Ada, MD;  Location: [facility];  Service: Gastroenterology;  Laterality: N/A;   ENDOVENOUS ABLATION SAPHENOUS VEIN W/ LASER Right 02/07/2022   endovenous laser ablation right greater saphenous vein and stab phlebectomy 10-20 incisions right leg by Gae Gallop MD   HEMOSTASIS CLIP PLACEMENT  09/29/2021   Procedure: HEMOSTASIS CLIP PLACEMENT;  Surgeon: Carol Ada, MD;  Location: [facility];  Service: Gastroenterology;;   INTESTINAL BLOCKAGE     POLYPECTOMY  09/29/2021   Procedure: POLYPECTOMY;  Surgeon: Carol Ada, MD;  Location: [facility];  Service: Gastroenterology;;   TONSILLECTOMY     Social History   Occupational History   Not on file  Tobacco Use   Smoking status: Never    Passive exposure: Past   Smokeless tobacco: Never  Vaping Use   Vaping Use: Never used  Substance and Sexual Activity   Alcohol use: Yes    Alcohol/week: 4.0 standard drinks of alcohol    Types: 4 Glasses of wine per week    Comment: occ   Drug use: No   Sexual activity: Not  on file

## 2022-04-24 ENCOUNTER — Ambulatory Visit (HOSPITAL_COMMUNITY): Payer: Medicare HMO | Attending: Physician Assistant | Admitting: Physical Therapy

## 2022-04-24 DIAGNOSIS — M19072 Primary osteoarthritis, left ankle and foot: Secondary | ICD-10-CM | POA: Insufficient documentation

## 2022-04-24 DIAGNOSIS — M542 Cervicalgia: Secondary | ICD-10-CM | POA: Diagnosis present

## 2022-04-24 DIAGNOSIS — M5030 Other cervical disc degeneration, unspecified cervical region: Secondary | ICD-10-CM | POA: Insufficient documentation

## 2022-04-24 DIAGNOSIS — Z872 Personal history of diseases of the skin and subcutaneous tissue: Secondary | ICD-10-CM | POA: Insufficient documentation

## 2022-04-24 DIAGNOSIS — M7061 Trochanteric bursitis, right hip: Secondary | ICD-10-CM | POA: Insufficient documentation

## 2022-04-24 DIAGNOSIS — R293 Abnormal posture: Secondary | ICD-10-CM

## 2022-04-24 DIAGNOSIS — Z5181 Encounter for therapeutic drug level monitoring: Secondary | ICD-10-CM | POA: Diagnosis present

## 2022-04-24 DIAGNOSIS — Z8659 Personal history of other mental and behavioral disorders: Secondary | ICD-10-CM | POA: Diagnosis present

## 2022-04-24 DIAGNOSIS — M62838 Other muscle spasm: Secondary | ICD-10-CM | POA: Diagnosis present

## 2022-04-24 DIAGNOSIS — M810 Age-related osteoporosis without current pathological fracture: Secondary | ICD-10-CM | POA: Insufficient documentation

## 2022-04-24 DIAGNOSIS — M19041 Primary osteoarthritis, right hand: Secondary | ICD-10-CM | POA: Diagnosis present

## 2022-04-24 DIAGNOSIS — M19071 Primary osteoarthritis, right ankle and foot: Secondary | ICD-10-CM | POA: Insufficient documentation

## 2022-04-24 DIAGNOSIS — E559 Vitamin D deficiency, unspecified: Secondary | ICD-10-CM | POA: Diagnosis present

## 2022-04-24 DIAGNOSIS — M19042 Primary osteoarthritis, left hand: Secondary | ICD-10-CM | POA: Diagnosis present

## 2022-04-24 DIAGNOSIS — M4005 Postural kyphosis, thoracolumbar region: Secondary | ICD-10-CM | POA: Diagnosis present

## 2022-04-24 DIAGNOSIS — I73 Raynaud'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: Secondary | ICD-10-CM | POA: Insufficient documentation

## 2022-04-24 DIAGNOSIS — M546 Pain in thoracic spine: Secondary | ICD-10-CM

## 2022-04-24 DIAGNOSIS — M5136 Other intervertebral disc degeneration, lumbar region: Secondary | ICD-10-CM | POA: Diagnosis present

## 2022-04-24 DIAGNOSIS — M5134 Other intervertebral disc degeneration, thoracic region: Secondary | ICD-10-CM | POA: Insufficient documentation

## 2022-04-24 NOTE — Therapy (Signed)
OUTPATIENT PHYSICAL THERAPY CERVICAL EVALUATION   Patient Name: Lauren Knox MRN: [id number] DOB:1945/09/18, 77 y.o., female Today's Date: 04/24/2022  END OF SESSION:  PT End of Session - 04/24/22 0937     Visit Number 1    Number of Visits 12    Date for PT Re-Evaluation 06/05/22    Authorization Type [employer]    Progress Note Due on Visit 10    PT Start Time 0905    PT Stop Time 0940    PT Time Calculation (min) 35 min    Activity Tolerance Patient tolerated treatment well    [facility] for tasks assessed/performed             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   Osteopenia    Raynaud's disease    Raynaud'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 01/27/2016   Rosacea 01/27/2016   Varicose veins    Vitamin D deficiency 01/27/2016   Past Surgical History:  Procedure Laterality Date   ABDOMINAL HYSTERECTOMY     CESAREAN SECTION     CHOLECYSTECTOMY     COLONOSCOPY  2023   COLONOSCOPY WITH PROPOFOL N/A 09/29/2021   Procedure: COLONOSCOPY WITH PROPOFOL;  Surgeon: Jeani HawkingHung, Patrick, MD;  [facility];  Service: Gastroenterology;  Laterality: N/A;   ENDOVENOUS ABLATION SAPHENOUS VEIN W/ LASER Right 02/07/2022   endovenous laser ablation right greater saphenous vein and stab phlebectomy 10-20 incisions right leg by Cari Carawayhris Dickson MD   HEMOSTASIS CLIP PLACEMENT  09/29/2021   Procedure: HEMOSTASIS CLIP PLACEMENT;  Surgeon: Jeani HawkingHung, Patrick, MD;  Location: [facility];  Service: Gastroenterology;;   INTESTINAL BLOCKAGE     POLYPECTOMY  09/29/2021   Procedure: POLYPECTOMY;  Surgeon: Jeani HawkingHung, Patrick, MD;  [facility];  Service: Gastroenterology;;   TONSILLECTOMY     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   Osteoarthritis  of hands, 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   DDD (degenerative disc disease), 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   Raynaud'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 01/27/2016   ADD (attention deficit disorder) 01/27/2016   Rosacea 01/27/2016   Vitamin D deficiency 01/27/2016   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   Muscle weakness (generalized) 09/17/2013   Scoliosis concern 09/17/2013   Stiffness of joint, not elsewhere classified, pelvic region and thigh 09/17/2013   Difficulty in walking(719.7) 09/17/2013    PCP: Carylon Perchesoy Fagan MD  REFERRING PROVIDER: Madelyn BrunnerBrooks, Dana, DO  REFERRING DIAG:  M54.2 (ICD-10-CM) - Cervical pain  M62.838 (ICD-10-CM) - Trapezius muscle spasm    THERAPY DIAG:  Pain in thoracic spine - Plan: PT plan of care cert/re-cert  Abnormal posture - Plan: PT plan of care cert/re-cert  Cervicalgia - Plan: PT plan of care cert/re-cert  Rationale for Evaluation and Treatment: Rehabilitation  ONSET DATE: chronic   SUBJECTIVE:  SUBJECTIVE STATEMENT: Patient presents to therapy with complaint of neck and upper back pain. This issue is chronic in nature. Patient has had series of injections which helped temporarily. She is taking muscle relaxer also, but effects are temporary. She does feel better overall but feels neck muscles continue to tighten up. She feels it may be her posturing. She was suggested to maybe try dry needling with therapy. She   PERTINENT HISTORY:  Arthritis, osteoporosis  PAIN:  Are you having pain? Yes: NPRS scale: 4/10 Pain location: LT upper trap  Pain description: dull, aching, tight, stiff Aggravating factors: posturing, prolonged position  Relieving factors: meds, exercise, massage, injections   PRECAUTIONS: None  WEIGHT BEARING RESTRICTIONS: No  FALLS:  Has patient fallen in last 6  months? No  LIVING ENVIRONMENT: Lives with: lives with their family and lives with their spouse Lives in: House/apartment Stairs: Yes: Internal: 13 steps; on right going up and on left going up and External: 2 steps; on right going up, on left going up, and can reach both Has following equipment at home: None    OCCUPATION: Retired, hobbies - cook, yard work, reading   PLOF: Independent  PATIENT GOALS: "stop it before it starts"  NEXT MD VISIT: June 2023  OBJECTIVE:   DIAGNOSTIC FINDINGS:  IMPRESSION: 1. No acute findings or explanation for the patient's symptoms in the cervical or thoracic spine. 2. Stable multilevel cervical spondylosis with disc bulging, uncinate spurring and facet hypertrophy as described. No significant spinal stenosis or nerve root encroachment. 3. Mild thoracic spondylosis without significant disc herniation, foraminal narrowing or nerve root encroachment. 4. No acute osseous findings. Normal appearance of the cervicothoracic cord.  PATIENT SURVEYS:  FOTO 45% function   COGNITION: Overall cognitive status: Within functional limits for tasks assessed  SENSATION: WFL  POSTURE: rounded shoulders and forward head  PALPATION: Mod TTP about LT upper trap, Min TTP about RT upper trap    CERVICAL ROM:   Active ROM A/PROM (deg) eval  Flexion 45  Extension 31  Right lateral flexion   Left lateral flexion   Right rotation 57  Left rotation 45   (Blank rows = not tested)  UPPER EXTREMITY ROM: WFL  UPPER EXTREMITY MMT:  MMT Right eval Left eval  Shoulder flexion 4+ 4+  Shoulder extension    Shoulder abduction 4+ 4+  Shoulder adduction    Shoulder extension    Shoulder internal rotation    Shoulder external rotation 4+ 4+  Middle trapezius    Lower trapezius    Elbow flexion    Elbow extension    Wrist flexion    Wrist extension    Wrist ulnar deviation    Wrist radial deviation    Wrist pronation    Wrist supination    Grip  strength     (Blank rows = not tested)   TODAY'S TREATMENT:  DATE:  04/24/22 Eval    PATIENT EDUCATION:  Education details: on Eval findings, POC, HEP and dry needling Person educated: Patient Education method: Explanation Education comprehension: verbalized understanding  HOME EXERCISE PROGRAM: Access Code: [email or web address] URL: [email or web address] Date: 04/24/2022 Prepared by: Georges Lynch  Exercises - Seated Cervical Retraction  - 3 x daily - 7 x weekly - 2 sets - 10 reps - 3 second hold - Seated Scapular Retraction  - 3 x daily - 7 x weekly - 2 sets - 10 reps - 3 second hold - Seated Upper Trap Stretch  - 3 x daily - 7 x weekly - 1 sets - 3 reps - 30 second hold  ASSESSMENT:  CLINICAL IMPRESSION: Patient is a 77 y.o. female who presents to physical therapy with complaint of neck and upper back pain. Patient demonstrates mild decreased strength, ROM restriction, reduced flexibility, increased tenderness to palpation and postural abnormalities which are likely contributing to symptoms of pain and are negatively impacting patient ability to perform ADLs. Patient will benefit from skilled physical therapy services to address these deficits to reduce pain and improve level of function with ADLs  OBJECTIVE IMPAIRMENTS: decreased activity tolerance, decreased mobility, decreased ROM, decreased strength, increased fascial restrictions, impaired flexibility, improper body mechanics, postural dysfunction, and pain.   ACTIVITY LIMITATIONS: carrying, lifting, sitting, sleeping, and reach over head  PARTICIPATION LIMITATIONS: meal prep, cleaning, laundry, driving, shopping, community activity, and yard work  PERSONAL FACTORS: Time since onset of injury/illness/exacerbation are also affecting patient's functional outcome.   REHAB POTENTIAL: Good  CLINICAL  DECISION MAKING: Stable/uncomplicated  EVALUATION COMPLEXITY: Low   GOALS: SHORT TERM GOALS: Target date: 05/15/2022  Patient will be independent with initial HEP and self-management strategies to improve functional outcomes Baseline:  Goal status: INITIAL   LONG TERM GOALS: Target date: 06/05/2022  Patient will be independent with advanced HEP and self-management strategies to improve functional outcomes Baseline:  Goal status: INITIAL  2.  Patient will improve FOTO score to predicted value to indicate improvement in functional outcomes Baseline: 45% function Goal status: INITIAL  3.  Patient will demo improved LT cervical rotation by at least 10 degrees in order to improve ability to scan environment for safety and while driving. Baseline: See AROM Goal status: INITIAL  4. Patient will report a decrease in neck pain to no more than 3/10 for improved quality of life and ability to perform UE ADLs  Baseline: 4/10 Goal status: INITIAL  PLAN:  PT FREQUENCY: 1-2x/week  PT DURATION: 6 weeks  PLANNED INTERVENTIONS: Therapeutic exercises, Therapeutic activity, Neuromuscular re-education, Balance training, Gait training, Patient/Family education, Joint manipulation, Joint mobilization, Stair training, Aquatic Therapy, Dry Needling, Electrical stimulation, Spinal manipulation, Spinal mobilization, Cryotherapy, Moist heat, scar mobilization, Taping, Traction, Ultrasound, Biofeedback, Ionotophoresis 4mg /ml Dexamethasone, and Manual therapy.  PLAN FOR NEXT SESSION: F/U on dry needling. Progress cervical mobility and postural strengthening as tolerated. Manual as needed.   9:40 AM, 04/24/22 Georges Lynch PT DPT  Physical Therapist with [facility]  [phone]

## 2022-04-24 NOTE — Patient Instructions (Signed)

## 2022-04-26 ENCOUNTER — Ambulatory Visit (HOSPITAL_COMMUNITY): Payer: Medicare HMO | Admitting: Physical Therapy

## 2022-04-26 ENCOUNTER — Encounter (HOSPITAL_COMMUNITY): Payer: Self-pay | Admitting: Physical Therapy

## 2022-04-26 DIAGNOSIS — R293 Abnormal posture: Secondary | ICD-10-CM

## 2022-04-26 DIAGNOSIS — M546 Pain in thoracic spine: Secondary | ICD-10-CM | POA: Diagnosis not present

## 2022-04-26 DIAGNOSIS — M542 Cervicalgia: Secondary | ICD-10-CM

## 2022-04-26 NOTE — Progress Notes (Signed)
Office Visit Note  Patient: Lauren Knox             Date of Birth: 1945/07/09           MRN: [id number]             PCP: Carylon PerchesFagan, Roy, MD Referring: Carylon PerchesFagan, Roy, MD Visit Date: 05/10/2022 Occupation: @GUAROCC @  Subjective:  Neck muscle spasm  History of Present Illness: Lauren Knox is a 77 y.o. female with history of osteoarthritis, degenerative disc disease and osteoporosis.  She returns today after her last visit in April 2023.  She states she has been seeing Dr. Shon BatonBrooks who helped her with the neck and lower back pain.  She has been going to physical therapy where she is getting dry needling for the trapezius spasm.  She continues to have some discomfort in her hands, hips, knees and her feet.  She states the trochanteric bursa pain has improved with the physical therapy.  The nocturnal pain has improved.  She has been on Forteo injections since November 2023.  Patient states that she had no interruption in her Forteo injections.  She has been taking vitamin D on a regular basis.  She restarted walking 1 mile daily as her pain has improved.  She had trigger point injections for the cervical region in October 2023 which gave her relief.  Since then she has been getting physical therapy and dry needling.    Activities of Daily Living:  Patient reports morning stiffness for a few minutes.   Patient Reports nocturnal pain.  Difficulty dressing/grooming: Denies Difficulty climbing stairs: Denies Difficulty getting out of chair: Denies Difficulty using hands for taps, buttons, cutlery, and/or writing: Reports  Review of Systems  Constitutional:  Positive for fatigue.  HENT:  Positive for mouth dryness. Negative for mouth sores.   Eyes:  Positive for dryness.  Respiratory:  Positive for shortness of breath.   Cardiovascular:  Negative for chest pain and palpitations.  Gastrointestinal:  Negative for blood in stool, constipation and diarrhea.  Endocrine: Negative for increased  urination.  Genitourinary:  Negative for involuntary urination.  Musculoskeletal:  Positive for joint pain, joint pain, joint swelling, myalgias, muscle weakness, morning stiffness, muscle tenderness and myalgias. Negative for gait problem.  Skin:  Negative for color change, rash, hair loss and sensitivity to sunlight.  Allergic/Immunologic: Negative for susceptible to infections.  Neurological:  Positive for dizziness and headaches.  Hematological:  Negative for swollen glands.  Psychiatric/Behavioral:  Positive for sleep disturbance. Negative for depressed mood. The patient is not nervous/anxious.     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   Muscle weakness (generalized) 09/17/2013   Scoliosis concern 09/17/2013   Stiffness of joint, not elsewhere classified, pelvic region and thigh 09/17/2013   Difficulty in walking(719.7) 09/17/2013    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   Osteopenia    Raynaud's disease    Raynaud'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 01/27/2016   Rosacea 01/27/2016   Varicose veins  Vitamin D deficiency 01/27/2016    Family History  Problem Relation Age of Onset   Cancer Mother    Cancer Father    Heart attack Father    Breast cancer Sister    Cancer Brother    Past Surgical History:  Procedure Laterality Date   ABDOMINAL HYSTERECTOMY     CESAREAN SECTION     CHOLECYSTECTOMY     COLONOSCOPY  2023   COLONOSCOPY WITH PROPOFOL N/A  09/29/2021   Procedure: COLONOSCOPY WITH PROPOFOL;  Surgeon: Jeani Hawking, MD;  Location: [facility];  Service: Gastroenterology;  Laterality: N/A;   ENDOVENOUS ABLATION SAPHENOUS VEIN W/ LASER Right 02/07/2022   endovenous laser ablation right greater saphenous vein and stab phlebectomy 10-20 incisions right leg by Cari Caraway MD   HEMOSTASIS CLIP PLACEMENT  09/29/2021   Procedure: HEMOSTASIS CLIP PLACEMENT;  Surgeon: Jeani Hawking, MD;  Location: [facility];  Service: Gastroenterology;;   INTESTINAL BLOCKAGE     POLYPECTOMY  09/29/2021   Procedure: POLYPECTOMY;  Surgeon: Jeani Hawking, MD;  Location: [facility];  Service: Gastroenterology;;   TONSILLECTOMY     Social History   Social History Narrative   Not on file   Immunization History  Administered Date(s) Administered   PFIZER(Purple Top)SARS-COV-2 Vaccination 02/21/2019, 03/14/2019, 11/19/2019     Objective: Vital Signs: BP 126/74 (BP Location: Left Arm, Patient Position: Sitting, Cuff Size: Normal)   Pulse (!) 52   Resp 13   Ht 5' 3.5" (1.613 m)   Wt 133 lb 6.4 oz (60.5 kg)   BMI 23.26 kg/m    Physical Exam Vitals and nursing note reviewed.  Constitutional:      Appearance: She is well-developed.  HENT:     Head: Normocephalic and atraumatic.  Eyes:     Conjunctiva/sclera: Conjunctivae normal.  Cardiovascular:     Rate and Rhythm: Normal rate and regular rhythm.     Heart sounds: Normal heart sounds.  Pulmonary:     Effort: Pulmonary effort is normal.     Breath sounds: Normal breath sounds.  Abdominal:     General: Bowel sounds are normal.     Palpations: Abdomen is soft.  Musculoskeletal:     Cervical back: Normal range of motion.  Lymphadenopathy:     Cervical: No cervical adenopathy.  Skin:    General: Skin is warm and dry.     Capillary Refill: Capillary refill takes less than 2 seconds.  Neurological:     Mental Status: She is alert and oriented to person, place, and time.  Psychiatric:         Behavior: Behavior normal.      Musculoskeletal Exam: She had limited lateral rotation and extension of her cervical spine.  She had bilateral trapezius spasms.  She had no tenderness over thoracic or lumbar spine.  She had some discomfort range of motion of lumbar spine.  Shoulder joints, elbow joints, wrist joints, MCPs PIPs and DIPs were in good range of motion with no synovitis.  Bilateral PIP and DIP thickening was noted.  Hip joints were in good range of motion.  Knee joints were in good range of motion without any warmth swelling or effusion.  There was no tenderness over ankles or MTPs.  CDAI Exam: CDAI Score: -- Patient Global: --; Provider Global: -- Swollen: --; Tender: -- Joint Exam 05/10/2022   No joint exam has been documented for this visit   There is currently no information documented on the homunculus. Go to the Rheumatology activity and  complete the homunculus joint exam.  Investigation: No additional findings.  Imaging: MR Thoracic Spine w/o contrast  Result Date: 04/13/2022 CLINICAL DATA:  Chronic neck and mid back pain with spasms. Bilateral shoulder pain. No acute injury or prior relevant surgery. EXAM: MRI CERVICAL AND THORACIC SPINE WITHOUT CONTRAST TECHNIQUE: Multiplanar and multiecho pulse sequences of the cervical spine, to include the craniocervical junction and cervicothoracic junction, and the thoracic spine, were obtained without intravenous contrast. COMPARISON:  Radiographs of the cervical and thoracic spine 02/26/2022 and 04/12/2022. MRI of the cervical spine 11/19/2017 FINDINGS: MRI CERVICAL SPINE FINDINGS Alignment: Exaggerated cervical lordosis with stable minimal multilevel anterolisthesis. No focal angulation. Vertebrae: No acute or suspicious osseous findings. Multilevel facet arthropathy. Cord: Normal in signal and caliber. Posterior Fossa, vertebral arteries, paraspinal tissues: Mild chronic small vessel ischemic changes within the  brainstem.Bilateral vertebral artery flow voids. No significant paraspinal findings. Grossly stable mild left apical scarring. Disc levels: C2-3: The disc appears normal. Asymmetric left facet hypertrophy contributing to minimal left foraminal narrowing appears unchanged. The spinal canal is widely patent. C3-4: The disc appears normal. Stable left greater than right facet hypertrophy without resulting spinal stenosis or foraminal narrowing. C4-5: Stable bilateral facet hypertrophy, advanced on the left. Minimal disc bulging and uncinate spurring. No spinal stenosis or significant foraminal narrowing. C5-6: Stable mild loss of disc height with disc bulging, uncinate spurring and bilateral facet hypertrophy, worse on the left. No spinal stenosis. Mild right-greater-than-left foraminal narrowing appears similar. C6-7: Stable mild disc bulging, uncinate spurring and bilateral facet hypertrophy. No spinal stenosis or significant foraminal narrowing. C7-T1: Bilateral facet hypertrophy. No significant spinal stenosis or nerve root encroachment. MRI THORACIC SPINE FINDINGS Alignment:  Physiologic. Vertebrae: No acute or suspicious osseous findings. Cord: The thoracic cord appears normal in signal and caliber.The conus medullaris extends to the mid L1 level. Paraspinal and other soft tissues: No significant paraspinal abnormalities. Disc levels: The thoracic spinal canal is widely patent. There is mild disc bulging and endplate osteophyte formation throughout the thoracic spine. No significant disc herniation, foraminal narrowing or nerve root encroachment identified. Localizing images of the lumbar spine demonstrate mild multilevel spondylosis without evidence of high-grade spinal stenosis. IMPRESSION: 1. No acute findings or explanation for the patient's symptoms in the cervical or thoracic spine. 2. Stable multilevel cervical spondylosis with disc bulging, uncinate spurring and facet hypertrophy as described. No  significant spinal stenosis or nerve root encroachment. 3. Mild thoracic spondylosis without significant disc herniation, foraminal narrowing or nerve root encroachment. 4. No acute osseous findings. Normal appearance of the cervicothoracic cord. Electronically Signed   By: Carey Bullocks M.D.   On: 04/13/2022 18:55   MR Cervical Spine w/o contrast  Result Date: 04/13/2022 CLINICAL DATA:  Chronic neck and mid back pain with spasms. Bilateral shoulder pain. No acute injury or prior relevant surgery. EXAM: MRI CERVICAL AND THORACIC SPINE WITHOUT CONTRAST TECHNIQUE: Multiplanar and multiecho pulse sequences of the cervical spine, to include the craniocervical junction and cervicothoracic junction, and the thoracic spine, were obtained without intravenous contrast. COMPARISON:  Radiographs of the cervical and thoracic spine 02/26/2022 and 04/12/2022. MRI of the cervical spine 11/19/2017 FINDINGS: MRI CERVICAL SPINE FINDINGS Alignment: Exaggerated cervical lordosis with stable minimal multilevel anterolisthesis. No focal angulation. Vertebrae: No acute or suspicious osseous findings. Multilevel facet arthropathy. Cord: Normal in signal and caliber. Posterior Fossa, vertebral arteries, paraspinal tissues: Mild chronic small vessel ischemic changes within the brainstem.Bilateral vertebral artery flow voids. No significant paraspinal findings. Grossly stable mild left apical scarring.  Disc levels: C2-3: The disc appears normal. Asymmetric left facet hypertrophy contributing to minimal left foraminal narrowing appears unchanged. The spinal canal is widely patent. C3-4: The disc appears normal. Stable left greater than right facet hypertrophy without resulting spinal stenosis or foraminal narrowing. C4-5: Stable bilateral facet hypertrophy, advanced on the left. Minimal disc bulging and uncinate spurring. No spinal stenosis or significant foraminal narrowing. C5-6: Stable mild loss of disc height with disc bulging,  uncinate spurring and bilateral facet hypertrophy, worse on the left. No spinal stenosis. Mild right-greater-than-left foraminal narrowing appears similar. C6-7: Stable mild disc bulging, uncinate spurring and bilateral facet hypertrophy. No spinal stenosis or significant foraminal narrowing. C7-T1: Bilateral facet hypertrophy. No significant spinal stenosis or nerve root encroachment. MRI THORACIC SPINE FINDINGS Alignment:  Physiologic. Vertebrae: No acute or suspicious osseous findings. Cord: The thoracic cord appears normal in signal and caliber.The conus medullaris extends to the mid L1 level. Paraspinal and other soft tissues: No significant paraspinal abnormalities. Disc levels: The thoracic spinal canal is widely patent. There is mild disc bulging and endplate osteophyte formation throughout the thoracic spine. No significant disc herniation, foraminal narrowing or nerve root encroachment identified. Localizing images of the lumbar spine demonstrate mild multilevel spondylosis without evidence of high-grade spinal stenosis. IMPRESSION: 1. No acute findings or explanation for the patient's symptoms in the cervical or thoracic spine. 2. Stable multilevel cervical spondylosis with disc bulging, uncinate spurring and facet hypertrophy as described. No significant spinal stenosis or nerve root encroachment. 3. Mild thoracic spondylosis without significant disc herniation, foraminal narrowing or nerve root encroachment. 4. No acute osseous findings. Normal appearance of the cervicothoracic cord. Electronically Signed   By: Carey Bullocks M.D.   On: 04/13/2022 18:55    Recent Labs: Lab Results  Component Value Date   WBC 9.9 11/01/2021   HGB 15.4 11/01/2021   PLT 471 (H) 11/01/2021   NA 139 11/01/2021   K 4.5 11/01/2021   CL 102 11/01/2021   CO2 27 11/01/2021   GLUCOSE 100 (H) 11/01/2021   BUN 20 11/01/2021   CREATININE 0.88 11/01/2021   BILITOT 0.8 11/01/2021   AST 17 11/01/2021   ALT 8 11/01/2021    PROT 7.3 11/01/2021   CALCIUM 10.7 (H) 11/01/2021   GFRAA 67 11/27/2019    Speciality Comments: Reclast - 04/30/16, 11/13/19,11/17/20 Forteo started 11/16/21  Procedures:  No procedures performed Allergies: Codeine and Lisinopril   Assessment / Plan:     Visit Diagnoses: Primary osteoarthritis of both hands-she continues to have some stiffness in her hands.  Bilateral PIP and DIP thickening was noted.  No synovitis was noted.  Joint protection muscle strengthening was discussed.  Primary osteoarthritis of both feet-she denies any discomfort today.  Proper fitting shoes were advised.  DDD (degenerative disc disease), cervical-she continues to have neck discomfort.  She has degenerative disc disease.  She has been seeing Dr. Shon Baton.  She is also going to physical therapy which has been helpful.  Trapezius muscle spasm -she takes methocarbamol 500 mg 1 tablet daily as needed for muscle spasms and tizanidine 2 mg at bedtime as needed for muscle spasms.  She had bilateral trapezius spasm.  She had cortisone trigger point injections in October 2023 with good relief.  Currently she is going for dry needling which is helping.  Patient will contact us if she needs trigger point injections.  A handout on neck exercises was provided.  DDD (degenerative disc disease), thoracic-chronic discomfort  Postural kyphosis of thoracolumbar region  DDD (  degenerative disc disease), lumbar-is chronic discomfort.  She goes to Dr. Shon Baton.  She states she has had injections with Dr. Shon Baton office.  Trochanteric bursitis of right hip - Under the care of Dr. Shon Baton.  IT band stretches were demonstrated.  Raynaud'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-currently not active during the warmer temperatures.  Age-related osteoporosis without current pathological fracture - DEXA updated on 10/10/2021: Right femoral neck T score -2.6, left femoral neck T score -2.5. forteo started on 11/16/2021.  She had Reclast prior to [facility] in  2018, 2021 and 2022.- Plan: VITAMIN D 25 Hydroxy (Vit-D Deficiency, Fractures)  Vitamin D deficiency -she has been taking vitamin D 1000 units daily.  Her calcium was mildly elevated in October 2023 at 10.7.  She was advised to stop calcium supplement.  Plan: VITAMIN D 25 Hydroxy (Vit-D Deficiency, Fractures)  Medication monitoring encounter - Plan: CBC with Differential/Platelet, COMPLETE METABOLIC PANEL WITH GFR  History of rosacea  History of attention deficit disorder  Orders: Orders Placed This Encounter  Procedures   CBC with Differential/Platelet   COMPLETE METABOLIC PANEL WITH GFR   VITAMIN D 25 Hydroxy (Vit-D Deficiency, Fractures)   No orders of the defined types were placed in this encounter.  Face-to-face time spent patient was 30 minutes.  Greater than 50% time was spent in counseling and coordination of care.  Follow-Up Instructions: Return in about 6 months (around 11/09/2022) for Osteoarthritis, Osteoporosis.   Pollyann Savoy, MD  Note - This record has been created using Animal nutritionist.  Chart creation errors have been sought, but may not always  have been located. Such creation errors do not reflect on  the standard of medical care.

## 2022-04-26 NOTE — Therapy (Signed)
OUTPATIENT PHYSICAL THERAPY CERVICAL EVALUATION   Patient Name: Lauren Knox MRN: [id number] DOB:10/30/1945, 77 y.o., female Today's Date: 04/26/2022  END OF SESSION:  PT End of Session - 04/26/22 1402     Visit Number 2    Number of Visits 12    Date for PT Re-Evaluation 06/05/22    Authorization Type [employer]    Progress Note Due on Visit 10    PT Start Time 1358    PT Stop Time 1427    PT Time Calculation (min) 29 min    Activity Tolerance Patient tolerated treatment well    Behavior During Therapy 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   Osteopenia    Raynaud's disease    Raynaud'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 01/27/2016   Rosacea 01/27/2016   Varicose veins    Vitamin D deficiency 01/27/2016   Past Surgical History:  Procedure Laterality Date   ABDOMINAL HYSTERECTOMY     CESAREAN SECTION     CHOLECYSTECTOMY     COLONOSCOPY  2023   COLONOSCOPY WITH PROPOFOL N/A 09/29/2021   Procedure: COLONOSCOPY WITH PROPOFOL;  Surgeon: Jeani Hawking, MD;  Location: [facility];  Service: Gastroenterology;  Laterality: N/A;   ENDOVENOUS ABLATION SAPHENOUS VEIN W/ LASER Right 02/07/2022   endovenous laser ablation right greater saphenous vein and stab phlebectomy 10-20 incisions right leg by Cari Caraway MD   HEMOSTASIS CLIP PLACEMENT  09/29/2021   Procedure: HEMOSTASIS CLIP PLACEMENT;  Surgeon: Jeani Hawking, MD;  Location: [facility];  Service: Gastroenterology;;   INTESTINAL BLOCKAGE     POLYPECTOMY  09/29/2021   Procedure: POLYPECTOMY;  Surgeon: Jeani Hawking, MD;  Location: [facility];  Service: Gastroenterology;;   TONSILLECTOMY     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   Osteoarthritis  of hands, 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   DDD (degenerative disc disease), 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   Raynaud'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 01/27/2016   ADD (attention deficit disorder) 01/27/2016   Rosacea 01/27/2016   Vitamin D deficiency 01/27/2016   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 No issues reported. No pain right now.   PERTINENT HISTORY:  Arthritis, osteoporosis  PAIN:  Are you having pain? Yes: NPRS scale: 0/10 Pain location: LT upper trap  Pain description: dull, aching, tight, stiff Aggravating factors: posturing, prolonged position  Relieving factors: meds, exercise, massage, injections   PRECAUTIONS: None  WEIGHT BEARING RESTRICTIONS: No  FALLS:  Has patient fallen in last 6 months? No  LIVING ENVIRONMENT: Lives with: lives with their family and lives with their spouse Lives in: House/apartment Stairs: Yes: Internal: 13 steps; on right going up and on left going up and External: 2 steps; on right going up, on left going up, and can reach both Has following equipment at home: None    OCCUPATION: Retired, hobbies - cook, yard work, reading   PLOF: Independent  PATIENT GOALS: "stop it before it  starts"  NEXT MD VISIT: June 2023  OBJECTIVE:   DIAGNOSTIC FINDINGS:  IMPRESSION: 1. No acute findings or explanation for the patient's symptoms in the cervical or thoracic spine. 2. Stable multilevel cervical spondylosis with disc bulging, uncinate spurring and facet hypertrophy as described. No significant spinal stenosis or nerve root encroachment. 3. Mild thoracic spondylosis without significant disc herniation, foraminal narrowing or nerve root encroachment. 4. No acute osseous findings. Normal appearance of the cervicothoracic cord.  PATIENT SURVEYS:  FOTO 45% function   COGNITION: Overall cognitive status: Within functional limits for tasks assessed  SENSATION: WFL  POSTURE: rounded shoulders and forward head  PALPATION: Mod TTP about LT upper trap, Min TTP about RT upper trap    CERVICAL ROM:   Active ROM A/PROM (deg) eval  Flexion 45  Extension 31  Right lateral flexion   Left lateral flexion   Right rotation 57  Left rotation 45   (Blank rows = not tested)  UPPER EXTREMITY ROM: WFL  UPPER EXTREMITY MMT:  MMT Right eval Left eval  Shoulder flexion 4+ 4+  Shoulder extension    Shoulder abduction 4+ 4+  Shoulder adduction    Shoulder extension    Shoulder internal rotation    Shoulder external rotation 4+ 4+  Middle trapezius    Lower trapezius    Elbow flexion    Elbow extension    Wrist flexion    Wrist extension    Wrist ulnar deviation    Wrist radial deviation    Wrist pronation    Wrist supination    Grip strength     (Blank rows = not tested)   TODAY'S TREATMENT:                                                                                                                              DATE:  04/26/22 Chin tuck 10 x 3"  Scap retraction 10 x 3" UT stretch 2 x 30"  Levator stretch 2 x 30"   Manual STM to LT upper trap pre and post dry needling  for trigger point identification and surface area preparation    Trigger Point  Dry-Needling  Treatment instructions: Expect mild to moderate muscle soreness. S/S of pneumothorax if dry needled over a lung field, and to seek immediate medical attention should they occur. Patient verbalized understanding of these instructions and education.  Patient Consent Given: Yes Education handout provided: Previously provided Muscles treated: LT upper trap  Electrical stimulation performed: No Parameters: N/A Treatment response/outcome: Good tolerance    04/24/22 Eval    PATIENT EDUCATION:  Education details: on Eval findings, POC, HEP and dry needling Person educated: Patient Education method: Explanation Education comprehension: verbalized understanding  HOME EXERCISE PROGRAM: Access Code: [email or web address] URL: [email or web address] 04/26/22 - Gentle Levator Scapulae Stretch  - 3 x daily - 7 x weekly - 1 sets - 3 reps - 30 second hold  Date: 04/24/2022 Prepared by: Georges Lynch  Exercises - Seated Cervical Retraction  - 3 x daily - 7 x weekly - 2 sets - 10 reps - 3 second hold - Seated Scapular Retraction  - 3 x daily - 7 x weekly - 2 sets - 10 reps - 3 second hold - Seated Upper Trap Stretch  - 3 x daily - 7 x weekly - 1 sets - 3 reps - 30 second hold  ASSESSMENT:  CLINICAL IMPRESSION: Session limited due to late arrival. Patient tolerated session well today. Good return with HEP exercise. Added levator stretching for decreasing neck pain. Performed dry needling to address restriction and palpable trigger points in cervical and scapular musculature. Added levator stretching to HEP. Patient will continue to benefit from skilled therapy services to reduce remaining deficits and improve functional ability.   OBJECTIVE IMPAIRMENTS: decreased activity tolerance, decreased mobility, decreased ROM, decreased strength, increased fascial restrictions, impaired flexibility, improper body mechanics, postural dysfunction, and pain.   ACTIVITY LIMITATIONS: carrying,  lifting, sitting, sleeping, and reach over head  PARTICIPATION LIMITATIONS: meal prep, cleaning, laundry, driving, shopping, community activity, and yard work  PERSONAL FACTORS: Time since onset of injury/illness/exacerbation are also affecting patient's functional outcome.   REHAB POTENTIAL: Good  CLINICAL DECISION MAKING: Stable/uncomplicated  EVALUATION COMPLEXITY: Low   GOALS: SHORT TERM GOALS: Target date: 05/15/2022  Patient will be independent with initial HEP and self-management strategies to improve functional outcomes Baseline:  Goal status: INITIAL   LONG TERM GOALS: Target date: 06/05/2022  Patient will be independent with advanced HEP and self-management strategies to improve functional outcomes Baseline:  Goal status: INITIAL  2.  Patient will improve FOTO score to predicted value to indicate improvement in functional outcomes Baseline: 45% function Goal status: INITIAL  3.  Patient will demo improved LT cervical rotation by at least 10 degrees in order to improve ability to scan environment for safety and while driving. Baseline: See AROM Goal status: INITIAL  4. Patient will report a decrease in neck pain to no more than 3/10 for improved quality of life and ability to perform UE ADLs  Baseline: 4/10 Goal status: INITIAL  PLAN:  PT FREQUENCY: 1-2x/week  PT DURATION: 6 weeks  PLANNED INTERVENTIONS: Therapeutic exercises, Therapeutic activity, Neuromuscular re-education, Balance training, Gait training, Patient/Family education, Joint manipulation, Joint mobilization, Stair training, Aquatic Therapy, Dry Needling, Electrical stimulation, Spinal manipulation, Spinal mobilization, Cryotherapy, Moist heat, scar mobilization, Taping, Traction, Ultrasound, Biofeedback, Ionotophoresis 4mg /ml Dexamethasone, and Manual therapy.  PLAN FOR NEXT SESSION: F/U on dry needling. Progress cervical mobility and postural strengthening as tolerated. Manual as needed. Add band  work for postural strength.  2:29 PM, 04/26/22  Georges Lynchameron Tavari Loadholt PT DPT  Physical Therapist [facility]  [phone]

## 2022-04-30 ENCOUNTER — Ambulatory Visit (HOSPITAL_COMMUNITY): Payer: Medicare HMO | Admitting: Physical Therapy

## 2022-04-30 DIAGNOSIS — M542 Cervicalgia: Secondary | ICD-10-CM

## 2022-04-30 DIAGNOSIS — M546 Pain in thoracic spine: Secondary | ICD-10-CM

## 2022-04-30 DIAGNOSIS — R293 Abnormal posture: Secondary | ICD-10-CM

## 2022-04-30 NOTE — Therapy (Signed)
OUTPATIENT PHYSICAL THERAPY CERVICAL EVALUATION   Patient Name: Lauren Knox MRN: [id number] DOB:February 12, 1945, 77 y.o., female Today's Date: 04/30/2022  END OF SESSION:  PT End of Session - 04/30/22 0922     Visit Number 3    Number of Visits 12    Date for PT Re-Evaluation 06/05/22    Authorization Type [employer]    Progress Note Due on Visit 10    PT Start Time 0921    PT Stop Time 0946    PT Time Calculation (min) 25 min    Activity Tolerance Patient tolerated treatment well    Behavior During Therapy [facility] for tasks assessed/performed             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   Osteopenia    Raynaud's disease    Raynaud'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 01/27/2016   Rosacea 01/27/2016   Varicose veins    Vitamin D deficiency 01/27/2016   Past Surgical History:  Procedure Laterality Date   ABDOMINAL HYSTERECTOMY     CESAREAN SECTION     CHOLECYSTECTOMY     COLONOSCOPY  2023   COLONOSCOPY WITH PROPOFOL N/A 09/29/2021   Procedure: COLONOSCOPY WITH PROPOFOL;  Surgeon: Jeani Hawking, MD;  Location: [facility];  Service: Gastroenterology;  Laterality: N/A;   ENDOVENOUS ABLATION SAPHENOUS VEIN W/ LASER Right 02/07/2022   endovenous laser ablation right greater saphenous vein and stab phlebectomy 10-20 incisions right leg by Cari Caraway MD   HEMOSTASIS CLIP PLACEMENT  09/29/2021   Procedure: HEMOSTASIS CLIP PLACEMENT;  Surgeon: Jeani Hawking, MD;  Location: [facility];  Service: Gastroenterology;;   INTESTINAL BLOCKAGE     POLYPECTOMY  09/29/2021   Procedure: POLYPECTOMY;  Surgeon: Jeani Hawking, MD;  Location: [facility];  Service: Gastroenterology;;   TONSILLECTOMY     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   Osteoarthritis  of hands, 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   DDD (degenerative disc disease), 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   Raynaud'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 01/27/2016   ADD (attention deficit disorder) 01/27/2016   Rosacea 01/27/2016   Vitamin D deficiency 01/27/2016   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 Not sore after last session. No pain right now.   PERTINENT HISTORY:  Arthritis, osteoporosis  PAIN:  Are you having pain? Yes: NPRS scale: 0/10 Pain location: LT upper trap  Pain description: dull, aching, tight, stiff Aggravating factors: posturing, prolonged position  Relieving factors: meds, exercise, massage, injections   PRECAUTIONS: None  WEIGHT BEARING RESTRICTIONS: No  FALLS:  Has patient fallen in last 6 months? No  LIVING ENVIRONMENT: Lives with: lives with their family and lives with their spouse Lives in: House/apartment Stairs: Yes: Internal: 13 steps; on right going up and on left going up and External: 2 steps; on right going up, on left going up, and can reach both Has following equipment at home: None    OCCUPATION: Retired, hobbies - cook, yard work, reading   PLOF: Independent  PATIENT GOALS:  "stop it before it starts"  NEXT MD VISIT: June 2023  OBJECTIVE:   DIAGNOSTIC FINDINGS:  IMPRESSION: 1. No acute findings or explanation for the patient's symptoms in the cervical or thoracic spine. 2. Stable multilevel cervical spondylosis with disc bulging, uncinate spurring and facet hypertrophy as described. No significant spinal stenosis or nerve root encroachment. 3. Mild thoracic spondylosis without significant disc herniation, foraminal narrowing or nerve root encroachment. 4. No acute osseous findings. Normal appearance of the cervicothoracic cord.  PATIENT SURVEYS:  FOTO 45% function   COGNITION: Overall cognitive status: Within functional limits for tasks assessed  SENSATION: WFL  POSTURE: rounded shoulders and forward head  PALPATION: Mod TTP about LT upper trap, Min TTP about RT upper trap    CERVICAL ROM:   Active ROM A/PROM (deg) eval  Flexion 45  Extension 31  Right lateral flexion   Left lateral flexion   Right rotation 57  Left rotation 45   (Blank rows = not tested)  UPPER EXTREMITY ROM: WFL  UPPER EXTREMITY MMT:  MMT Right eval Left eval  Shoulder flexion 4+ 4+  Shoulder extension    Shoulder abduction 4+ 4+  Shoulder adduction    Shoulder extension    Shoulder internal rotation    Shoulder external rotation 4+ 4+  Middle trapezius    Lower trapezius    Elbow flexion    Elbow extension    Wrist flexion    Wrist extension    Wrist ulnar deviation    Wrist radial deviation    Wrist pronation    Wrist supination    Grip strength     (Blank rows = not tested)   TODAY'S TREATMENT:                                                                                                                              DATE:  04/30/22 Seated thoracic extension x10  Seated thoracic rotation x10 Shoulder ER GTB 10 x 5" Shoulder horizontal abduction GTB 10 x 5"  Doorway stretch 2 x 30"  Manual STM to RT upper trap pre and post dry needling  for trigger point identification and surface area preparation    Trigger Point Dry-Needling  Treatment instructions: Expect mild to moderate muscle soreness. S/S of pneumothorax if dry needled over a lung field, and to seek immediate medical attention should they occur. Patient verbalized understanding of these instructions and education.  Patient Consent Given: Yes Education handout provided: Previously provided Muscles treated: RT upper trap  Electrical stimulation performed: No Parameters: N/A Treatment response/outcome: Good tolerance    04/26/22 Chin tuck 10 x 3"  Scap retraction 10 x 3" UT stretch 2 x 30"  Levator stretch 2 x 30"   Manual STM to LT upper trap pre and post dry needling for trigger point identification and surface area preparation    Trigger Point Dry-Needling  Treatment instructions: Expect mild to moderate muscle soreness. S/S of pneumothorax if dry needled over a lung field, and to seek immediate medical attention should they occur. Patient verbalized understanding of these instructions and education.  Patient Consent Given: Yes Education handout provided: Previously provided Muscles treated: LT upper trap  Electrical stimulation performed: No Parameters: N/A Treatment response/outcome: Good tolerance    04/24/22 Eval    PATIENT EDUCATION:  Education details: on Eval findings, POC, HEP and dry needling Person educated: Patient Education method: Explanation Education comprehension: verbalized understanding  HOME EXERCISE PROGRAM: Access Code: [email or web address] URL: [email or web address] 04/30/22 - Corner Pec Minor Stretch  - 2-3 x daily - 7 x weekly - 1 sets - 3 reps - 30 second hold  04/26/22 - Gentle Levator Scapulae Stretch  - 3 x daily - 7 x weekly - 1 sets - 3 reps - 30 second hold  Date: 04/24/2022 Prepared by: Georges Lynch  Exercises - Seated Cervical Retraction  - 3 x daily - 7 x weekly - 2 sets - 10 reps - 3 second hold - Seated  Scapular Retraction  - 3 x daily - 7 x weekly - 2 sets - 10 reps - 3 second hold - Seated Upper Trap Stretch  - 3 x daily - 7 x weekly - 1 sets - 3 reps - 30 second hold  ASSESSMENT:  CLINICAL IMPRESSION: Patient tolerated session well today. Noting increased muscle fatigue with added band resisted exercise for postural strengthening. Patient educated on purpose and function of all added exercise. Patient showing good return with all prior there ex. Ongoing palpable trigger points in upper trapezius. Addressed with dry needling with good tolerance. Patient will continue to benefit from skilled therapy services to reduce remaining deficits and improve functional ability.   OBJECTIVE IMPAIRMENTS: decreased activity tolerance, decreased mobility, decreased ROM, decreased strength, increased fascial restrictions, impaired flexibility, improper body mechanics, postural dysfunction, and pain.   ACTIVITY LIMITATIONS: carrying, lifting, sitting, sleeping, and reach over head  PARTICIPATION LIMITATIONS: meal prep, cleaning, laundry, driving, shopping, community activity, and yard work  PERSONAL FACTORS: Time since onset of injury/illness/exacerbation are also affecting patient's functional outcome.   REHAB POTENTIAL: Good  CLINICAL DECISION MAKING: Stable/uncomplicated  EVALUATION COMPLEXITY: Low   GOALS: SHORT TERM GOALS: Target date: 05/15/2022  Patient will be independent with initial HEP and self-management strategies to improve functional outcomes Baseline:  Goal status: INITIAL   LONG TERM GOALS: Target date: 06/05/2022  Patient will be independent with advanced HEP and self-management strategies to improve functional outcomes Baseline:  Goal status: INITIAL  2.  Patient will improve FOTO score to predicted value to indicate improvement in functional outcomes Baseline:  45% function Goal status: INITIAL  3.  Patient will demo improved LT cervical rotation by at least 10 degrees in  order to improve ability to scan environment for safety and while driving. Baseline: See AROM Goal status: INITIAL  4. Patient will report a decrease in neck pain to no more than 3/10 for improved quality of life and ability to perform UE ADLs  Baseline: 4/10 Goal status: INITIAL  PLAN:  PT FREQUENCY: 1-2x/week  PT DURATION: 6 weeks  PLANNED INTERVENTIONS: Therapeutic exercises, Therapeutic activity, Neuromuscular re-education, Balance training, Gait training, Patient/Family education, Joint manipulation, Joint mobilization, Stair training, Aquatic Therapy, Dry Needling, Electrical stimulation, Spinal manipulation, Spinal mobilization, Cryotherapy, Moist heat, scar mobilization, Taping, Traction, Ultrasound, Biofeedback, Ionotophoresis 4mg /ml Dexamethasone, and Manual therapy.  PLAN FOR NEXT SESSION: F/U on dry needling. Progress cervical mobility and postural strengthening as tolerated. Manual as needed. Add band work for postural strength.  10:00 AM, 04/30/22 Georges Lynch PT DPT  Physical Therapist with [facility]  [phone]

## 2022-05-02 ENCOUNTER — Encounter (HOSPITAL_COMMUNITY): Payer: Medicare HMO | Admitting: Physical Therapy

## 2022-05-07 ENCOUNTER — Ambulatory Visit (HOSPITAL_COMMUNITY): Payer: Medicare HMO | Admitting: Physical Therapy

## 2022-05-07 DIAGNOSIS — M546 Pain in thoracic spine: Secondary | ICD-10-CM

## 2022-05-07 DIAGNOSIS — M542 Cervicalgia: Secondary | ICD-10-CM

## 2022-05-07 DIAGNOSIS — R293 Abnormal posture: Secondary | ICD-10-CM

## 2022-05-07 NOTE — Therapy (Signed)
OUTPATIENT PHYSICAL THERAPY CERVICAL EVALUATION   Patient Name: Lauren Knox MRN: [id number] DOB:01-15-1946, 77 y.o., female Today's Date: 05/07/2022  END OF SESSION:  PT End of Session - 05/07/22 1308     Visit Number 4    Number of Visits 12    Date for PT Re-Evaluation 06/05/22    Authorization Type [employer]    Progress Note Due on Visit 10    PT Start Time 1305    PT Stop Time 1343    PT Time Calculation (min) 38 min    Activity Tolerance Patient tolerated treatment well    Behavior During Therapy [facility] for tasks assessed/performed             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   Osteopenia    Raynaud's disease    Raynaud'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 01/27/2016   Rosacea 01/27/2016   Varicose veins    Vitamin D deficiency 01/27/2016   Past Surgical History:  Procedure Laterality Date   ABDOMINAL HYSTERECTOMY     CESAREAN SECTION     CHOLECYSTECTOMY     COLONOSCOPY  2023   COLONOSCOPY WITH PROPOFOL N/A 09/29/2021   Procedure: COLONOSCOPY WITH PROPOFOL;  Surgeon: Jeani Hawking, MD;  Location: [facility];  Service: Gastroenterology;  Laterality: N/A;   ENDOVENOUS ABLATION SAPHENOUS VEIN W/ LASER Right 02/07/2022   endovenous laser ablation right greater saphenous vein and stab phlebectomy 10-20 incisions right leg by Cari Caraway MD   HEMOSTASIS CLIP PLACEMENT  09/29/2021   Procedure: HEMOSTASIS CLIP PLACEMENT;  Surgeon: Jeani Hawking, MD;  Location: [facility];  Service: Gastroenterology;;   INTESTINAL BLOCKAGE     POLYPECTOMY  09/29/2021   Procedure: POLYPECTOMY;  Surgeon: Jeani Hawking, MD;  Location: [facility];  Service: Gastroenterology;;   TONSILLECTOMY     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   Osteoarthritis  of hands, 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   DDD (degenerative disc disease), 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   Raynaud'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 01/27/2016   ADD (attention deficit disorder) 01/27/2016   Rosacea 01/27/2016   Vitamin D deficiency 01/27/2016   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 Neck is not really hurting. She said if she stands too long she "gets it in her back". No pain currently.   PERTINENT HISTORY:  Arthritis, osteoporosis  PAIN:  Are you having pain? Yes: NPRS scale: 0/10 Pain location: LT upper trap  Pain description: dull, aching, tight, stiff Aggravating factors: posturing, prolonged position  Relieving factors: meds, exercise, massage, injections   PRECAUTIONS: None  WEIGHT BEARING RESTRICTIONS: No  FALLS:  Has patient fallen in last 6 months? No  LIVING ENVIRONMENT: Lives with: lives with their family and lives with their spouse Lives in: House/apartment Stairs: Yes: Internal: 13 steps; on right going up and on left going up and External: 2 steps; on right going up, on left going up, and can reach both Has following equipment at home: None    OCCUPATION: Retired, hobbies - cook, yard work,  reading   PLOF: Independent  PATIENT GOALS: "stop it before it starts"  NEXT MD VISIT: June 2023  OBJECTIVE:   DIAGNOSTIC FINDINGS:  IMPRESSION: 1. No acute findings or explanation for the patient's symptoms in the cervical or thoracic spine. 2. Stable multilevel cervical spondylosis with disc bulging, uncinate spurring and facet hypertrophy as described. No significant spinal stenosis or nerve root encroachment. 3. Mild thoracic spondylosis without significant disc herniation, foraminal narrowing or nerve root encroachment. 4. No acute osseous findings. Normal appearance of the cervicothoracic cord.  PATIENT SURVEYS:  FOTO 45% function   COGNITION: Overall cognitive status: Within functional limits for tasks assessed  SENSATION: WFL  POSTURE: rounded shoulders and forward head  PALPATION: Mod TTP about LT upper trap, Min TTP about RT upper trap    CERVICAL ROM:   Active ROM A/PROM (deg) eval  Flexion 45  Extension 31  Right lateral flexion   Left lateral flexion   Right rotation 57  Left rotation 45   (Blank rows = not tested)  UPPER EXTREMITY ROM: WFL  UPPER EXTREMITY MMT:  MMT Right eval Left eval  Shoulder flexion 4+ 4+  Shoulder extension    Shoulder abduction 4+ 4+  Shoulder adduction    Shoulder extension    Shoulder internal rotation    Shoulder external rotation 4+ 4+  Middle trapezius    Lower trapezius    Elbow flexion    Elbow extension    Wrist flexion    Wrist extension    Wrist ulnar deviation    Wrist radial deviation    Wrist pronation    Wrist supination    Grip strength     (Blank rows = not tested)   TODAY'S TREATMENT:                                                                                                                              DATE:  05/07/22 Seated thoracic extension x10 Seated thoracic rotation x5 Seated UT stretch 2 x 30"  Seated Levator stretch 2 x 30"  Band rows GTB 2 x 10 Shoulder extension  GTB 2 x 10 Wall push ups x15 Chin tuck at wall 10 x 3" Doorway stretch 2 x 30"   Manual STM to bilat upper trap pre and post dry needling for trigger point identification and surface area preparation    Trigger Point Dry-Needling  Treatment instructions: Expect mild to moderate muscle soreness. S/S of pneumothorax if dry needled over a lung field, and to seek immediate medical attention should they occur. Patient verbalized understanding of these instructions and education.  Patient Consent Given: Yes Education handout provided: Previously provided Muscles treated: bilat upper trap  Electrical stimulation performed: No Parameters: N/A Treatment response/outcome: Good tolerance    04/30/22 Seated thoracic extension x10  Seated thoracic rotation x10 Shoulder ER GTB 10 x 5" Shoulder horizontal abduction GTB 10 x 5"  Doorway stretch 2 x 30"   Manual STM to RT upper trap pre and post dry needling for trigger point identification and surface area preparation    Trigger Point Dry-Needling  Treatment instructions: Expect mild to moderate muscle soreness. S/S of pneumothorax if dry needled over a lung field, and to seek immediate medical attention should they occur. Patient verbalized understanding of these instructions and education.  Patient Consent Given: Yes Education handout provided: Previously provided Muscles treated: RT upper trap  Electrical stimulation performed: No Parameters: N/A Treatment response/outcome: Good tolerance    04/26/22 Chin tuck 10 x 3"  Scap retraction 10 x 3" UT stretch 2 x 30"  Levator stretch 2 x 30"   Manual STM to LT upper trap pre and post dry needling for trigger point identification and surface area preparation    Trigger Point Dry-Needling  Treatment instructions: Expect mild to moderate muscle soreness. S/S of pneumothorax if dry needled over a lung field, and to seek immediate medical attention should they occur. Patient verbalized  understanding of these instructions and education.  Patient Consent Given: Yes Education handout provided: Previously provided Muscles treated: LT upper trap  Electrical stimulation performed: No Parameters: N/A Treatment response/outcome: Good tolerance    04/24/22 Eval    PATIENT EDUCATION:  Education details: on Eval findings, POC, HEP and dry needling Person educated: Patient Education method: Explanation Education comprehension: verbalized understanding  HOME EXERCISE PROGRAM: Access Code: [email or web address] URL: [email or web address] 05/07/22 - Standing Shoulder Row with Anchored Resistance  - 2 x daily - 7 x weekly - 2 sets - 10 reps - Shoulder extension with resistance - Neutral  - 2 x daily - 7 x weekly - 2 sets - 10 reps  04/30/22 - Corner Pec Minor Stretch  - 2-3 x daily - 7 x weekly - 1 sets - 3 reps - 30 second hold  04/26/22 - Gentle Levator Scapulae Stretch  - 3 x daily - 7 x weekly - 1 sets - 3 reps - 30 second hold  Date: 04/24/2022 Prepared by: Georges Lynch  Exercises - Seated Cervical Retraction  - 3 x daily - 7 x weekly - 2 sets - 10 reps - 3 second hold - Seated Scapular Retraction  - 3 x daily - 7 x weekly - 2 sets - 10 reps - 3 second hold - Seated Upper Trap Stretch  - 3 x daily - 7 x weekly - 1 sets - 3 reps - 30 second hold  ASSESSMENT:  CLINICAL IMPRESSION: Patient tolerated session well today. Progressed postural strength with added band rows and extensions as well as as chin tuck at wall. Patient with  no increased complaint of pain but remains limited by postural deficit contribution to ongoing fascial restriction. Patient will continue to benefit from skilled therapy services to reduce remaining deficits and improve functional ability.    OBJECTIVE IMPAIRMENTS: decreased activity tolerance, decreased mobility, decreased ROM, decreased strength, increased fascial restrictions, impaired flexibility, improper body mechanics, postural  dysfunction, and pain.   ACTIVITY LIMITATIONS: carrying, lifting, sitting, sleeping, and reach over head  PARTICIPATION LIMITATIONS: meal prep, cleaning, laundry, driving, shopping, community activity, and yard work  PERSONAL FACTORS: Time since onset of injury/illness/exacerbation are also affecting patient's functional outcome.   REHAB POTENTIAL: Good  CLINICAL DECISION MAKING: Stable/uncomplicated  EVALUATION COMPLEXITY: Low   GOALS: SHORT TERM GOALS: Target date: 05/15/2022  Patient will be independent with initial HEP and self-management strategies to improve functional outcomes Baseline:  Goal status: INITIAL   LONG TERM GOALS: Target date: 06/05/2022  Patient will be independent with advanced HEP and self-management strategies to improve functional outcomes Baseline:  Goal status: INITIAL  2.  Patient will improve FOTO score to predicted value to indicate improvement in functional outcomes Baseline: 45% function Goal status: INITIAL  3.  Patient will demo improved LT cervical rotation by at least 10 degrees in order to improve ability to scan environment for safety and while driving. Baseline: See AROM Goal status: INITIAL  4. Patient will report a decrease in neck pain to no more than 3/10 for improved quality of life and ability to perform UE ADLs  Baseline: 4/10 Goal status: INITIAL  PLAN:  PT FREQUENCY: 1-2x/week   PT DURATION: 6 weeks  PLANNED INTERVENTIONS: Therapeutic exercises, Therapeutic activity, Neuromuscular re-education, Balance training, Gait training, Patient/Family education, Joint manipulation, Joint mobilization, Stair training, Aquatic Therapy, Dry Needling, Electrical stimulation, Spinal manipulation, Spinal mobilization, Cryotherapy, Moist heat, scar mobilization, Taping, Traction, Ultrasound, Biofeedback, Ionotophoresis /ml Dexamethasone, and Manual therapy.  PLAN FOR NEXT SESSION: F/U on dry needling. Progress cervical mobility and  postural strengthening as tolerated. Manual as needed.    1:09 PM, 05/07/22 Georges Lynch PT DPT  Physical Therapist with [facility]  [phone]

## 2022-05-10 ENCOUNTER — Encounter: Payer: Self-pay | Admitting: Rheumatology

## 2022-05-10 ENCOUNTER — Ambulatory Visit: Payer: Medicare HMO | Admitting: Rheumatology

## 2022-05-10 VITALS — BP 126/74 | HR 52 | Resp 13 | Ht 63.5 in | Wt 133.4 lb

## 2022-05-10 DIAGNOSIS — M62838 Other muscle spasm: Secondary | ICD-10-CM | POA: Diagnosis not present

## 2022-05-10 DIAGNOSIS — M810 Age-related osteoporosis without current pathological fracture: Secondary | ICD-10-CM

## 2022-05-10 DIAGNOSIS — I73 Raynaud'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: Secondary | ICD-10-CM

## 2022-05-10 DIAGNOSIS — M7061 Trochanteric bursitis, right hip: Secondary | ICD-10-CM

## 2022-05-10 DIAGNOSIS — M19071 Primary osteoarthritis, right ankle and foot: Secondary | ICD-10-CM

## 2022-05-10 DIAGNOSIS — Z8659 Personal history of other mental and behavioral disorders: Secondary | ICD-10-CM

## 2022-05-10 DIAGNOSIS — M19041 Primary osteoarthritis, right hand: Secondary | ICD-10-CM

## 2022-05-10 DIAGNOSIS — M5030 Other cervical disc degeneration, unspecified cervical region: Secondary | ICD-10-CM | POA: Diagnosis not present

## 2022-05-10 DIAGNOSIS — M4005 Postural kyphosis, thoracolumbar region: Secondary | ICD-10-CM

## 2022-05-10 DIAGNOSIS — Z5181 Encounter for therapeutic drug level monitoring: Secondary | ICD-10-CM

## 2022-05-10 DIAGNOSIS — M19072 Primary osteoarthritis, left ankle and foot: Secondary | ICD-10-CM

## 2022-05-10 DIAGNOSIS — E559 Vitamin D deficiency, unspecified: Secondary | ICD-10-CM

## 2022-05-10 DIAGNOSIS — M5134 Other intervertebral disc degeneration, thoracic region: Secondary | ICD-10-CM

## 2022-05-10 DIAGNOSIS — Z872 Personal history of diseases of the skin and subcutaneous tissue: Secondary | ICD-10-CM

## 2022-05-10 DIAGNOSIS — M19042 Primary osteoarthritis, left hand: Secondary | ICD-10-CM

## 2022-05-10 DIAGNOSIS — M5136 Other intervertebral disc degeneration, lumbar region: Secondary | ICD-10-CM

## 2022-05-10 NOTE — Patient Instructions (Signed)
Cervical Strain and Sprain Rehab Ask your health care provider which exercises are safe for you. Do exercises exactly as told by your health care provider and adjust them as directed. It is normal to feel mild stretching, pulling, tightness, or discomfort as you do these exercises. Stop right away if you feel sudden pain or your pain gets worse. Do not begin these exercises until told by your health care provider. Stretching and range-of-motion exercises Cervical side bending  Using good posture, sit on a stable chair or stand up. Without moving your shoulders, slowly tilt your left / right ear to your shoulder until you feel a stretch in the neck muscles on the opposite side. You should be looking straight ahead. Hold for __________ seconds. Repeat with the other side of your neck. Repeat __________ times. Complete this exercise __________ times a day. Cervical rotation  Using good posture, sit on a stable chair or stand up. Slowly turn your head to the side as if you are looking over your left / right shoulder. Keep your eyes level with the ground. Stop when you feel a stretch along the side and the back of your neck. Hold for __________ seconds. Repeat this by turning to your other side. Repeat __________ times. Complete this exercise __________ times a day. Thoracic extension and pectoral stretch  Roll a towel or a small blanket so it is about 4 inches (10 cm) in diameter. Lie down on your back on a firm surface. Put the towel in the middle of your back across your spine. It should not be under your shoulder blades. Put your hands behind your head and let your elbows fall out to your sides. Hold for __________ seconds. Repeat __________ times. Complete this exercise __________ times a day. Strengthening exercises Upper cervical flexion  Lie on your back with a thin pillow behind your head or a small, rolled-up towel under your neck. Gently tuck your chin toward your chest and nod  your head down to look toward your feet. Do not lift your head off the pillow. Hold for __________ seconds. Release the tension slowly. Relax your neck muscles completely before you repeat this exercise. Repeat __________ times. Complete this exercise __________ times a day. Cervical extension  Stand about 6 inches (15 cm) away from a wall, with your back facing the wall. Place a soft object, about 6-8 inches (15-20 cm) in diameter, between the back of your head and the wall. A soft object could be a small pillow, a ball, or a folded towel. Gently tilt your head back and press into the soft object. Keep your jaw and forehead relaxed. Hold for __________ seconds. Release the tension slowly. Relax your neck muscles completely before you repeat this exercise. Repeat __________ times. Complete this exercise __________ times a day. Posture and body mechanics Body mechanics refer to the movements and positions of your body while you do your daily activities. Posture is part of body mechanics. Good posture and healthy body mechanics can help to relieve stress in your body's tissues and joints. Good posture means that your spine is in its natural S-curve position (your spine is neutral), your shoulders are pulled back slightly, and your head is not tipped forward. The following are general guidelines for using improved posture and body mechanics in your everyday activities. Sitting  When sitting, keep your spine neutral and keep your feet flat on the floor. Use a footrest, if needed, and keep your thighs parallel to the floor. Avoid rounding   your shoulders. Avoid tilting your head forward. When working at a desk or a computer, keep your desk at a height where your hands are slightly lower than your elbows. Slide your chair under your desk so you are close enough to maintain good posture. When working at a computer, place your monitor at a height where you are looking straight ahead and you do not have to  tilt your head forward or downward to look at the screen. Standing  When standing, keep your spine neutral and keep your feet about hip-width apart. Keep a slight bend in your knees. Your ears, shoulders, and hips should line up. When you do a task in which you stand in one place for a long time, place one foot up on a stable object that is 2-4 inches (5-10 cm) high, such as a footstool. This helps keep your spine neutral. Resting When lying down and resting, avoid positions that are most painful for you. Try to support your neck in a neutral position. You can use a contour pillow or a small rolled-up towel. Your pillow should support your neck but not push on it. This information is not intended to replace advice given to you by your health care provider. Make sure you discuss any questions you have with your health care provider. Document Revised: 07/24/2021 Document Reviewed: 07/24/2021 Elsevier Patient Education  2023 [employer]  

## 2022-05-11 LAB — COMPLETE METABOLIC PANEL WITH GFR
AG Ratio: 1.8 (calc) (ref 1.0–2.5)
ALT: 10 U/L (ref 6–29)
AST: 19 U/L (ref 10–35)
Albumin: 4.3 g/dL (ref 3.6–5.1)
Alkaline phosphatase (APISO): 92 U/L (ref 37–153)
BUN/Creatinine Ratio: 26 (calc) — ABNORMAL HIGH (ref 6–22)
BUN: 27 mg/dL — ABNORMAL HIGH (ref 7–25)
CO2: 26 mmol/L (ref 20–32)
Calcium: 10.8 mg/dL — ABNORMAL HIGH (ref 8.6–10.4)
Chloride: 103 mmol/L (ref 98–110)
Creat: 1.02 mg/dL — ABNORMAL HIGH (ref 0.60–1.00)
Globulin: 2.4 g/dL (calc) (ref 1.9–3.7)
Glucose, Bld: 94 mg/dL (ref 65–99)
Potassium: 4.8 mmol/L (ref 3.5–5.3)
Sodium: 138 mmol/L (ref 135–146)
Total Bilirubin: 0.5 mg/dL (ref 0.2–1.2)
Total Protein: 6.7 g/dL (ref 6.1–8.1)
eGFR: 58 mL/min/{1.73_m2} — ABNORMAL LOW (ref >=60)

## 2022-05-11 LAB — CBC WITH DIFFERENTIAL/PLATELET
Absolute Monocytes: 768 cells/uL (ref 200–950)
Basophils Absolute: 58 cells/uL (ref 0–200)
Basophils Relative: 0.6 %
Eosinophils Absolute: 173 cells/uL (ref 15–500)
Eosinophils Relative: 1.8 %
HCT: 39 % (ref 35.0–45.0)
Hemoglobin: 13.8 g/dL (ref 11.7–15.5)
Lymphs Abs: 1824 cells/uL (ref 850–3900)
MCH: 33.1 pg — ABNORMAL HIGH (ref 27.0–33.0)
MCHC: 35.4 g/dL (ref 32.0–36.0)
MCV: 93.5 fL (ref 80.0–100.0)
MPV: 9.5 fL (ref 7.5–12.5)
Monocytes Relative: 8 %
Neutro Abs: 6778 cells/uL (ref 1500–7800)
Neutrophils Relative %: 70.6 %
Platelets: 441 10*3/uL — ABNORMAL HIGH (ref 140–400)
RBC: 4.17 10*6/uL (ref 3.80–5.10)
RDW: 13.3 % (ref 11.0–15.0)
Total Lymphocyte: 19 %
WBC: 9.6 10*3/uL (ref 3.8–10.8)

## 2022-05-11 LAB — VITAMIN D 25 HYDROXY (VIT D DEFICIENCY, FRACTURES): Vit D, 25-Hydroxy: 29 ng/mL — ABNORMAL LOW (ref 30–100)

## 2022-05-11 NOTE — Progress Notes (Signed)
Vitamin D is low at 29.  Patient should continue to take vitamin D.  Calcium is elevated at 10.8.  She should avoid calcium supplement.  Creatinine is mildly elevated.  Patient should increase water intake.  CBC is stable.

## 2022-05-15 ENCOUNTER — Ambulatory Visit (HOSPITAL_COMMUNITY): Payer: Medicare HMO | Admitting: Physical Therapy

## 2022-05-15 DIAGNOSIS — M546 Pain in thoracic spine: Secondary | ICD-10-CM | POA: Diagnosis not present

## 2022-05-15 DIAGNOSIS — M542 Cervicalgia: Secondary | ICD-10-CM

## 2022-05-15 DIAGNOSIS — R293 Abnormal posture: Secondary | ICD-10-CM

## 2022-05-15 NOTE — Therapy (Signed)
OUTPATIENT PHYSICAL THERAPY CERVICAL EVALUATION   Patient Name: Lauren Knox MRN: [id number] DOB:03/05/1945, 77 y.o., female Today's Date: 05/15/2022  PHYSICAL THERAPY DISCHARGE SUMMARY  Visits from Start of Care: 5  Current functional level related to goals / functional outcomes: See below   Remaining deficits: See below   Education / Equipment: See assessment    Patient agrees to discharge. Patient goals were partially met. Patient is being discharged due to being pleased with the current functional level.  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   Osteopenia    Raynaud's disease    Raynaud'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 01/27/2016   Rosacea 01/27/2016   Varicose veins    Vitamin D deficiency 01/27/2016   Past Surgical History:  Procedure Laterality Date   ABDOMINAL HYSTERECTOMY     CESAREAN SECTION     CHOLECYSTECTOMY     COLONOSCOPY  2023   COLONOSCOPY WITH PROPOFOL N/A 09/29/2021   Procedure: COLONOSCOPY WITH PROPOFOL;  Surgeon: Jeani Hawking, MD;  Location: [facility];  Service: Gastroenterology;  Laterality: N/A;   ENDOVENOUS ABLATION SAPHENOUS VEIN W/ LASER Right 02/07/2022   endovenous laser ablation right greater saphenous vein and stab phlebectomy 10-20 incisions right leg by Cari Caraway MD   HEMOSTASIS CLIP PLACEMENT  09/29/2021   Procedure:  HEMOSTASIS CLIP PLACEMENT;  Surgeon: Jeani Hawking, MD;  Location: [facility];  Service: Gastroenterology;;   INTESTINAL BLOCKAGE     POLYPECTOMY  09/29/2021   Procedure: POLYPECTOMY;  Surgeon: Jeani Hawking, MD;  Location: [facility];  Service: Gastroenterology;;   TONSILLECTOMY     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 No neck pain. She is doing more and the more she does the better she feels. The only thing that is bothering her is her low back.   PERTINENT HISTORY:  Arthritis, osteoporosis  PAIN:  Are you having pain? Yes: NPRS scale: 0 (neck) 5 (low back) /10 Pain location: LT upper trap  Pain description: dull, aching, tight, stiff Aggravating factors: posturing, prolonged position  Relieving factors: meds, exercise, massage, injections   PRECAUTIONS: None  WEIGHT BEARING RESTRICTIONS:  No  FALLS:  Has patient fallen in last 6 months? No  LIVING ENVIRONMENT: Lives with: lives with their family and lives with their spouse Lives in: House/apartment Stairs: Yes: Internal: 13 steps; on right going up and on left going up and External: 2 steps; on right going up, on left going up, and can reach both Has following equipment at home: None    OCCUPATION: Retired, hobbies - cook, yard work, reading   PLOF: Independent  PATIENT GOALS: "stop it before it starts"  NEXT MD VISIT: June 2023  OBJECTIVE:   DIAGNOSTIC FINDINGS:  IMPRESSION: 1. No acute findings or explanation for the patient's symptoms in the cervical or thoracic spine. 2. Stable multilevel cervical spondylosis with disc bulging, uncinate spurring and facet hypertrophy as described. No significant spinal stenosis or nerve root encroachment. 3. Mild thoracic spondylosis without significant disc herniation, foraminal narrowing or nerve root encroachment. 4. No acute osseous findings. Normal appearance of the cervicothoracic cord.  PATIENT SURVEYS:  FOTO 57% (was 45% function)   COGNITION: Overall cognitive status: Within functional limits for tasks assessed  SENSATION: WFL  POSTURE: rounded shoulders and forward head  PALPATION: Min TTP about LT upper trap, Min TTP about RT upper trap    CERVICAL ROM:   Active ROM A/PROM (deg) eval AROM  Flexion 45 40  Extension 31 27  Right lateral flexion    Left lateral flexion    Right rotation 57 53  Left rotation 45 52   (Blank rows = not tested)  UPPER EXTREMITY ROM: WFL  UPPER EXTREMITY MMT:  MMT Right eval Left eval  Shoulder flexion 4+ 4+  Shoulder extension    Shoulder abduction 4+ 4+  Shoulder adduction    Shoulder extension    Shoulder internal rotation    Shoulder external rotation 4+ 4+  Middle trapezius    Lower trapezius    Elbow flexion    Elbow extension    Wrist flexion    Wrist extension    Wrist ulnar deviation     Wrist radial deviation    Wrist pronation    Wrist supination    Grip strength     (Blank rows = not tested)   TODAY'S TREATMENT:                                                                                                                              DATE:  05/15/22 FOTO AROM  05/07/22 Seated thoracic extension x10 Seated thoracic rotation x5 Seated UT stretch 2 x 30"  Seated Levator stretch 2 x 30"  Band rows GTB 2 x 10 Shoulder extension GTB 2 x 10 Wall push ups x15 Chin tuck at wall 10 x 3" Doorway stretch 2 x 30"   Manual STM to bilat upper trap pre and post dry needling for trigger point identification and surface area preparation    Trigger Point Dry-Needling  Treatment instructions: Expect mild to moderate muscle soreness. S/S of pneumothorax if dry needled over a lung field, and to seek immediate medical attention should they occur. Patient verbalized understanding of these instructions and education.  Patient Consent Given: Yes Education handout provided: Previously provided Muscles treated: bilat upper trap  Electrical stimulation performed: No Parameters: N/A Treatment response/outcome: Good tolerance     PATIENT EDUCATION:  Education details: on Eval findings, POC, HEP and dry needling Person educated: Patient Education method: Explanation Education comprehension: verbalized understanding  HOME EXERCISE PROGRAM: Access Code: [email or web address] URL: [email or web address] 05/07/22 - Standing Shoulder Row with Anchored Resistance  - 2 x daily - 7 x weekly - 2 sets - 10 reps - Shoulder extension with resistance - Neutral  - 2 x daily - 7 x weekly - 2 sets - 10 reps  04/30/22 - Corner Pec Minor Stretch  - 2-3 x daily - 7 x weekly - 1 sets - 3 reps - 30 second hold  04/26/22 - Gentle Levator Scapulae Stretch  - 3 x daily - 7 x weekly - 1 sets - 3 reps - 30 second hold  Date: 04/24/2022 Prepared by: Georges Lynch  Exercises - Seated Cervical  Retraction  - 3 x daily - 7 x weekly - 2 sets - 10 reps - 3 second hold - Seated Scapular Retraction  - 3 x daily - 7 x weekly - 2 sets - 10 reps - 3 second hold - Seated Upper Trap Stretch  - 3 x daily - 7 x weekly - 1 sets - 3 reps - 30 second hold  ASSESSMENT:  CLINICAL IMPRESSION: Performed reassessment today anticipating DC per patient subjected over last 2-3 visits. Patient states that she has not been having any issues with her neck, but when asked more specifically about about daily function and whether she feels ready for DC she ins unable to give a clear answer. Her hesitancy seems to revolve around uncertainly as to why her neck hurts when it does, although she has not reported flare up in last 2 weeks. We have discussed that this is likely due to poor posturing and that is why we have focused on postural strength exercise and manual interventions to address fascial restrictions. Upon further discussion, patient reports she agrees with DC today and that she feels confident to continue exercises at home. Reviewed HEP and encouraged patient to follow up with any further questions or concerns.    OBJECTIVE IMPAIRMENTS: decreased activity tolerance, decreased mobility, decreased ROM, decreased strength, increased fascial restrictions, impaired flexibility, improper body mechanics, postural dysfunction, and pain.   ACTIVITY LIMITATIONS: carrying, lifting, sitting, sleeping, and reach over head  PARTICIPATION LIMITATIONS: meal prep, cleaning, laundry, driving, shopping, community activity, and yard work  PERSONAL FACTORS: Time since onset of injury/illness/exacerbation are also affecting patient's functional outcome.   REHAB POTENTIAL: Good  CLINICAL DECISION MAKING: Stable/uncomplicated  EVALUATION COMPLEXITY: Low   GOALS: SHORT TERM GOALS: Target date: 05/15/2022  Patient will be independent with initial HEP and self-management strategies to improve functional  outcomes Baseline:   Goal status: MET   LONG TERM GOALS: Target date: 06/05/2022  Patient will be independent with advanced HEP and self-management strategies to improve functional outcomes Baseline:  Goal status: MET  2.  Patient will improve FOTO score to predicted value to indicate improvement in functional outcomes Baseline: 57% function Goal status: MET   3.  Patient will demo improved LT cervical rotation by at least 10 degrees in order to improve ability to scan environment for safety and while driving. Baseline: See AROM (within 1 degree of contralateral side)  Goal status: Partially MET  4. Patient will report a decrease in neck pain to no more than 3/10 for improved quality of life and ability to perform UE ADLs  Baseline: 0/10 Goal status: MET  PLAN:  PT FREQUENCY: 1-2x/week   PT DURATION: 6 weeks  PLANNED INTERVENTIONS: Therapeutic exercises, Therapeutic activity, Neuromuscular re-education, Balance training, Gait training, Patient/Family education, Joint manipulation, Joint mobilization, Stair training, Aquatic Therapy, Dry Needling, Electrical stimulation, Spinal manipulation, Spinal mobilization, Cryotherapy, Moist heat, scar mobilization, Taping, Traction, Ultrasound, Biofeedback, Ionotophoresis 4mg /ml Dexamethasone, and Manual therapy.  PLAN FOR NEXT SESSION: DC to HEP   1:54 PM, 05/15/22 Georges Lynch PT DPT  Physical Therapist with [facility]  [phone]

## 2022-05-21 ENCOUNTER — Encounter (HOSPITAL_COMMUNITY): Payer: Medicare HMO | Admitting: Physical Therapy

## 2022-05-22 ENCOUNTER — Encounter (HOSPITAL_COMMUNITY): Payer: Medicare HMO | Admitting: Physical Therapy

## 2022-05-24 ENCOUNTER — Other Ambulatory Visit (INDEPENDENT_AMBULATORY_CARE_PROVIDER_SITE_OTHER): Payer: Medicare HMO

## 2022-05-24 ENCOUNTER — Ambulatory Visit: Payer: Medicare HMO | Admitting: Sports Medicine

## 2022-05-24 ENCOUNTER — Encounter (HOSPITAL_COMMUNITY): Payer: Medicare HMO | Admitting: Physical Therapy

## 2022-05-24 ENCOUNTER — Encounter: Payer: Self-pay | Admitting: Sports Medicine

## 2022-05-24 DIAGNOSIS — M549 Dorsalgia, unspecified: Secondary | ICD-10-CM | POA: Diagnosis not present

## 2022-05-24 DIAGNOSIS — M51369 Other intervertebral disc degeneration, lumbar region without mention of lumbar back pain or lower extremity pain: Secondary | ICD-10-CM

## 2022-05-24 DIAGNOSIS — M5136 Other intervertebral disc degeneration, lumbar region: Secondary | ICD-10-CM

## 2022-05-24 DIAGNOSIS — M62838 Other muscle spasm: Secondary | ICD-10-CM

## 2022-05-24 DIAGNOSIS — M545 Low back pain, unspecified: Secondary | ICD-10-CM

## 2022-05-24 MED ORDER — BUPIVACAINE HCL 0.25 % IJ SOLN
1.0000 mL | INTRAMUSCULAR | Status: AC | PRN
Start: 2022-05-24 — End: 2022-05-24
  Administered 2022-05-24: 1 mL

## 2022-05-24 MED ORDER — LIDOCAINE HCL 1 % IJ SOLN
1.0000 mL | INTRAMUSCULAR | Status: AC | PRN
Start: 2022-05-24 — End: 2022-05-24
  Administered 2022-05-24: 1 mL

## 2022-05-24 MED ORDER — METHYLPREDNISOLONE ACETATE 40 MG/ML IJ SUSP
40.0000 mg | INTRAMUSCULAR | Status: AC | PRN
Start: 2022-05-24 — End: 2022-05-24
  Administered 2022-05-24: 40 mg via INTRAMUSCULAR

## 2022-05-24 NOTE — Progress Notes (Signed)
Mid to low back Acute; having to move items in house due to painting Aggravated the right side

## 2022-05-24 NOTE — Progress Notes (Signed)
Lauren Knox - 77 y.o. female MRN [id number]  Date of birth: February 17, 1945  Office Visit Note: Visit Date: 05/24/2022 PCP: Lauren Perches, MD Referred by: Lauren Perches, MD  Subjective: Chief Complaint  Patient presents with   Lower Back - Pain   HPI: Lauren Knox is a pleasant 77 y.o. female who presents today for acute on chronic mid to lower back pain.  R-sided muscles spasming of the thoracic and lumbar region of the back. No urinary frequency, burning, or change in bowel/bladder. No cough. She has been undergoing formalized physical therapy with some dry needling in the shoulder and trapezius muscles, this has been very helpful for her.  She has used Celebrex 100 mg once to twice daily.  She has been recently using tizanidine 2 mg about twice daily because of the muscle spasms.  Pertinent ROS were reviewed with the patient and found to be negative unless otherwise specified above in HPI.   Assessment & Plan: Visit Diagnoses:  1. Trigger point with back pain   2. Acute right-sided low back pain without sciatica   3. Trapezius muscle spasm   4. DDD (degenerative disc disease), lumbar    Plan: Discussed treatment options for Lauren Knox's trigger point, mid to low back pain and trapezius pain.  She is finding improvements from physical therapy and dry needling for the trapezius, she will continue this.  Through shared decision making did proceed with trigger point injections for the thoracic and lumbar paraspinal musculature as she has responded well to this in the past.  Would like her to continue her home exercise stretches for the low back.  She will continue her tizanidine 2 mg twice daily as needed.  She may continue Celebrex 100 mg once to twice daily.  I do think she would benefit from TENS unit stimulation, I did print out a sheet for her to purchase this over-the-counter through [place] if she desires.  Would recommend she do this at least twice daily.  Recommend heat to the  areas.  Return in red flag symptoms discussed.  She will follow-up in 3 weeks for reevaluation.  Hopefully we will not need additional pharmacological therapy, but if so we may put her on a very low-dose of prednisone and/or Cymbalta for some of the recurrence of her pain and trigger points.  Follow-up: Return in about 3 weeks (around 06/14/2022) for back pain.   Meds & Orders: No orders of the defined types were placed in this encounter.   Orders Placed This Encounter  Procedures   Trigger Point Inj   XR Lumbar Spine 2-3 Views     Procedures: Trigger Point Inj  Date/Time: 05/24/2022 11:49 AM  Performed by: Madelyn Brunner, DO Authorized by: Madelyn Brunner, DO   Consent Given by:  Patient Site marked: the procedure site was marked   Timeout: prior to procedure the correct patient, procedure, and site was verified   Indications:  Muscle spasm and pain Total # of Trigger Points:  3 or more Location: back   Needle Size:  27 G Approach:  Dorsal Medications #1:  1 mL lidocaine 1 %; 1 mL bupivacaine 0.25 %; 40 mg methylPREDNISolone acetate 40 MG/ML Medications #2:  1 mL lidocaine 1 %; 1 mL bupivacaine 0.25 %; 40 mg methylPREDNISolone acetate 40 MG/ML Medications #3:  1 mL lidocaine 1 %; 1 mL bupivacaine 0.25 % Additional Injections?: Yes   Medications #4:  1 mL lidocaine 1 %; 1 mL bupivacaine 0.25 % Patient tolerance:  Patient tolerated the procedure well with no immediate complications Comments: Procedure: Trigger point injection, R-sided thoracic and lumbar paraspinal musculature After discussion on R/B/I and informed verbal consent was obtained, a timeout was conducted. The patient was placed in a prone position on the examination table and the area of maximal tenderness was identified over the R-sided thoracic and lumbar paraspinal musculature. This area was cleansed with Betadine and multiple alcohol swabs. Ethyl chloride was used for local anesthesia. Using a 27-gauge 1.5 inch needle the  trigger point(s) was subsequently injected with a mixture of 2 cc of methylprednisolone 40 mg/mL and 4 cc of 1% lidocaine without epinephrine, with a total of 4cc of injectate into each trigger point (total of 4 injections). A band-aid was applied following. Patient tolerated procedure well, there were no post-injection complications. Post-procedure instructions were given.         Clinical History: No specialty comments available.  She reports that she has never smoked. She has been exposed to tobacco smoke. She has never used smokeless tobacco. No results for input(s): "HGBA1C", "LABURIC" in the last 8760 hours.  Objective:   Vital Signs: There were no vitals taken for this visit.  Physical Exam  Gen: Well-appearing, in no acute distress; non-toxic CV:  Well-perfused. Warm.  Resp: Breathing unlabored on room air; no wheezing. Psych: Fluid speech in conversation; appropriate affect; normal thought process Neuro: Sensation intact throughout. No gross coordination deficits.   Ortho Exam - Thoracic/Lumbar: There is no midline spinous process TTP over the thoracic or low lower lumbar spine.  There is right-sided thoracic and lumbar paraspinal hypertonicity with associated trigger points in this region.  No significant scapular dyskinesis.  -Cervical: Mild range of motion loss with left and right rotation.  There is right greater than left trapezius hypertonicity.  Bilateral shoulder protraction.   Imaging: XR Lumbar Spine 2-3 Views  Result Date: 05/24/2022 2 views of the lumbar spine including AP and lateral femoral ordered and reviewed by myself.  X-rays demonstrate mild to moderate lumbar DDD from L3 - S1.  There is at least moderate facet arthropathy around the L4 and L5 region.  No acute bony fracture or compression of intervertebral discs. Mild lumbar scoliosis.   Narrative & Impression  CLINICAL DATA:  Chronic neck and mid back pain with spasms. Bilateral shoulder pain. No acute  injury or prior relevant surgery.   EXAM: MRI CERVICAL AND THORACIC SPINE WITHOUT CONTRAST   TECHNIQUE: Multiplanar and multiecho pulse sequences of the cervical spine, to include the craniocervical junction and cervicothoracic junction, and the thoracic spine, were obtained without intravenous contrast.   COMPARISON:  Radiographs of the cervical and thoracic spine 02/26/2022 and 04/12/2022. MRI of the cervical spine 11/19/2017   FINDINGS: MRI CERVICAL SPINE FINDINGS   Alignment: Exaggerated cervical lordosis with stable minimal multilevel anterolisthesis. No focal angulation.   Vertebrae: No acute or suspicious osseous findings. Multilevel facet arthropathy.   Cord: Normal in signal and caliber.   Posterior Fossa, vertebral arteries, paraspinal tissues: Mild chronic small vessel ischemic changes within the brainstem.Bilateral vertebral artery flow voids. No significant paraspinal findings. Grossly stable mild left apical scarring.   Disc levels:   C2-3: The disc appears normal. Asymmetric left facet hypertrophy contributing to minimal left foraminal narrowing appears unchanged. The spinal canal is widely patent.   C3-4: The disc appears normal. Stable left greater than right facet hypertrophy without resulting spinal stenosis or foraminal narrowing.   C4-5: Stable bilateral facet hypertrophy, advanced on the left. Minimal  disc bulging and uncinate spurring. No spinal stenosis or significant foraminal narrowing.   C5-6: Stable mild loss of disc height with disc bulging, uncinate spurring and bilateral facet hypertrophy, worse on the left. No spinal stenosis. Mild right-greater-than-left foraminal narrowing appears similar.   C6-7: Stable mild disc bulging, uncinate spurring and bilateral facet hypertrophy. No spinal stenosis or significant foraminal narrowing.   C7-T1: Bilateral facet hypertrophy. No significant spinal stenosis or nerve root encroachment.   MRI  THORACIC SPINE FINDINGS   Alignment:  Physiologic.   Vertebrae: No acute or suspicious osseous findings.   Cord: The thoracic cord appears normal in signal and caliber.The conus medullaris extends to the mid L1 level.   Paraspinal and other soft tissues: No significant paraspinal abnormalities.   Disc levels:   The thoracic spinal canal is widely patent. There is mild disc bulging and endplate osteophyte formation throughout the thoracic spine. No significant disc herniation, foraminal narrowing or nerve root encroachment identified. Localizing images of the lumbar spine demonstrate mild multilevel spondylosis without evidence of high-grade spinal stenosis.   IMPRESSION: 1. No acute findings or explanation for the patient's symptoms in the cervical or thoracic spine. 2. Stable multilevel cervical spondylosis with disc bulging, uncinate spurring and facet hypertrophy as described. No significant spinal stenosis or nerve root encroachment. 3. Mild thoracic spondylosis without significant disc herniation, foraminal narrowing or nerve root encroachment. 4. No acute osseous findings. Normal appearance of the cervicothoracic cord.     Electronically Signed   By: Carey Bullocks M.D.   On: 04/13/2022 18:55   Past Medical/Family/Surgical/Social History: Medications & Allergies reviewed per EMR, new medications updated. 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   Muscle weakness (generalized) 09/17/2013   Scoliosis concern 09/17/2013   Stiffness of joint, not elsewhere classified, pelvic region and thigh 09/17/2013   Difficulty in walking(719.7) 09/17/2013    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   Osteopenia    Raynaud's disease    Raynaud'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 01/27/2016   Rosacea 01/27/2016   Varicose veins    Vitamin D deficiency 01/27/2016   Family History  Problem Relation Age of Onset   Cancer Mother    Cancer Father    Heart attack Father    Breast cancer Sister    Cancer Brother    Past Surgical History:  Procedure Laterality Date   ABDOMINAL HYSTERECTOMY     CESAREAN SECTION     CHOLECYSTECTOMY     COLONOSCOPY  2023   COLONOSCOPY WITH PROPOFOL N/A 09/29/2021   Procedure: COLONOSCOPY WITH PROPOFOL;  Surgeon: Jeani Hawking, MD;  Location: [facility];  Service: Gastroenterology;  Laterality: N/A;   ENDOVENOUS ABLATION SAPHENOUS VEIN W/ LASER Right 02/07/2022   endovenous laser ablation right greater saphenous vein and stab phlebectomy 10-20 incisions right leg by Cari Caraway MD   HEMOSTASIS CLIP PLACEMENT  09/29/2021   Procedure: HEMOSTASIS CLIP PLACEMENT;  Surgeon: Jeani Hawking, MD;  Location: [facility];  Service: Gastroenterology;;   INTESTINAL BLOCKAGE     POLYPECTOMY  09/29/2021   Procedure: POLYPECTOMY;  Surgeon: Jeani Hawking, MD;  Location: [facility];  Service: Gastroenterology;;   TONSILLECTOMY     Social History   Occupational History   Not on file  Tobacco Use   Smoking status: Never    Passive exposure: Past   Smokeless tobacco: Never  Vaping Use   Vaping Use: Never used  Substance and Sexual Activity   Alcohol use: Yes    Alcohol/week: 4.0 standard drinks of alcohol    Types: 4 Glasses of wine per week    Comment: occ   Drug use: No   Sexual activity: Not on file

## 2022-05-29 ENCOUNTER — Encounter (HOSPITAL_COMMUNITY): Payer: Medicare HMO | Admitting: Physical Therapy

## 2022-05-31 ENCOUNTER — Encounter (HOSPITAL_COMMUNITY): Payer: Medicare HMO | Admitting: Physical Therapy

## 2022-06-05 ENCOUNTER — Encounter (HOSPITAL_COMMUNITY): Payer: Medicare HMO | Admitting: Physical Therapy

## 2022-06-07 ENCOUNTER — Encounter (HOSPITAL_COMMUNITY): Payer: Medicare HMO | Admitting: Physical Therapy

## 2022-06-07 ENCOUNTER — Telehealth: Payer: Self-pay | Admitting: Sports Medicine

## 2022-06-07 NOTE — Telephone Encounter (Signed)
Patient states she is needing a change in her medication for spasms and pain, wants to speak to a technician. Please advise.Marland Kitchen

## 2022-06-08 ENCOUNTER — Ambulatory Visit: Payer: Medicare HMO | Admitting: Sports Medicine

## 2022-06-08 ENCOUNTER — Encounter: Payer: Self-pay | Admitting: Sports Medicine

## 2022-06-08 DIAGNOSIS — M62838 Other muscle spasm: Secondary | ICD-10-CM | POA: Diagnosis not present

## 2022-06-08 DIAGNOSIS — M5136 Other intervertebral disc degeneration, lumbar region: Secondary | ICD-10-CM

## 2022-06-08 DIAGNOSIS — M5134 Other intervertebral disc degeneration, thoracic region: Secondary | ICD-10-CM | POA: Diagnosis not present

## 2022-06-08 NOTE — Progress Notes (Signed)
Lauren Knox - 77 y.o. female MRN [id number]  Date of birth: [birth year]  Office Visit Note: Visit Date: 06/08/2022 PCP: Carylon Perches, MD Referred by: Carylon Perches, MD  Subjective: Chief Complaint  Patient presents with   Spine - Pain   HPI: Lauren Knox is a pleasant 77 y.o. female who presents today for back pain and muscle spasms.   She has seen me for her back pain as well as trapezius hypertonicity and trigger points in the past.  Here over the last few days her shoulders have been feeling much better but her back pain is really bothersome.  States it feels more like the muscle and is having associated spasms.  She is taking muscle relaxant at least twice a day.  She does have a funeral she is attending later today and is looking for relief to get her through today. Using Robaxin 500mg  BID-TID PRN.  She also picked up the TENS unit.  She has not taking any ibuprofen in the last 24 hours.  Pertinent ROS were reviewed with the patient and found to be negative unless otherwise specified above in HPI.   Assessment & Plan: Visit Diagnoses:  1. DDD (degenerative disc disease), lumbar   2. DDD (degenerative disc disease), thoracic   3. Muscle spasm    Plan: Discussed with Joellyn today that her pain seems muscular in nature.  She may continue her muscle relaxer with Robaxin 500 mg 3 times daily as needed.  Given that she will be on her feet a lot for this funeral today, we did proceed with IM Toradol and methylprednisolone injection into the buttock today.  She will continue her TENS unit as well as warm feet.  Would like her to get back into physical therapy as we discussed at her last visit.  She does have a follow-up with me in 2-3 weeks, she will keep this appointment, may call or return sooner if above treatments do not improve.  Follow-up: Return for Keep routine follow-up in 2-3 weeks (appt already made).   Meds & Orders: No orders of the defined types were placed in this  encounter.  No orders of the defined types were placed in this encounter.    Procedures: No procedures performed      - 30mg /mL (1mL) of Toradol administered into right buttock today - 40mg /mL of Methylprednisolone administered into the left buttock today  Clinical History: No specialty comments available.  She reports that she has never smoked. She has been exposed to tobacco smoke. She has never used smokeless tobacco. No results for input(s): "HGBA1C", "LABURIC" in the last 8760 hours.  Objective:   Vital Signs: There were no vitals taken for this visit.  Physical Exam  Gen: Well-appearing, in no acute distress; non-toxic CV: Well-perfused. Warm.  Resp: Breathing unlabored on room air; no wheezing. Psych: Fluid speech in conversation; appropriate affect; normal thought process Neuro: Sensation intact throughout. No gross coordination deficits.   Ortho Exam - Thoracic/Lumbar: There is an excessive thoracic kyphosis.  There is right-sided lumbar paraspinal hypertonicity.  She has full range of motion with lumbar flexion and extension.  Nonantalgic gait.  Imaging: No results found.  Past Medical/Family/Surgical/Social History: Medications & Allergies reviewed per EMR, new medications updated. 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   Muscle weakness (generalized) 09/17/2013   Scoliosis concern 09/17/2013   Stiffness of joint, not elsewhere classified, pelvic region and thigh 09/17/2013   Difficulty in walking(719.7) 09/17/2013   Past Medical History:  Diagnosis Date   ADD (attention deficit disorder) 01/27/2016   DDD (degenerative disc disease),  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   Hypertension    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   Osteoarthritis of hands, 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   Osteopenia    Raynaud's disease    Raynaud'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 01/27/2016   Rosacea 01/27/2016   Varicose veins    Vitamin D deficiency 01/27/2016   Family History  Problem Relation Age of Onset   Cancer Mother    Cancer Father    Heart attack Father    Breast cancer Sister    Cancer Brother    Past Surgical History:  Procedure Laterality Date   ABDOMINAL HYSTERECTOMY     CESAREAN SECTION     CHOLECYSTECTOMY     COLONOSCOPY  2023   COLONOSCOPY WITH PROPOFOL N/A 09/29/2021   Procedure: COLONOSCOPY WITH PROPOFOL;  Surgeon: Jeani Hawking, MD;  Location: [facility];  Service: Gastroenterology;  Laterality: N/A;   ENDOVENOUS ABLATION SAPHENOUS VEIN W/ LASER Right 02/07/2022   endovenous laser ablation right greater saphenous vein and stab phlebectomy 10-20 incisions right leg by Cari Caraway MD   HEMOSTASIS CLIP PLACEMENT  09/29/2021   Procedure: HEMOSTASIS CLIP PLACEMENT;  Surgeon: Jeani Hawking, MD;  Location: [facility];  Service: Gastroenterology;;   INTESTINAL BLOCKAGE     POLYPECTOMY  09/29/2021   Procedure: POLYPECTOMY;  Surgeon: Jeani Hawking, MD;  Location: [facility];  Service: Gastroenterology;;   TONSILLECTOMY     Social History   Occupational History   Not on file  Tobacco Use   Smoking status: Never    Passive exposure: Past   Smokeless tobacco: Never  Vaping Use   Vaping Use: Never used  Substance and Sexual Activity   Alcohol use: Yes    Alcohol/week: 4.0 standard drinks of alcohol    Types: 4 Glasses of wine per week    Comment: occ   Drug use: No   Sexual activity: Not on file

## 2022-06-12 ENCOUNTER — Encounter (HOSPITAL_COMMUNITY): Payer: Medicare HMO | Admitting: Physical Therapy

## 2022-06-14 ENCOUNTER — Encounter (HOSPITAL_COMMUNITY): Payer: Medicare HMO | Admitting: Physical Therapy

## 2022-06-14 ENCOUNTER — Ambulatory Visit: Payer: Medicare HMO | Admitting: Sports Medicine

## 2022-06-19 ENCOUNTER — Ambulatory Visit: Payer: Medicare HMO | Admitting: Sports Medicine

## 2022-06-19 ENCOUNTER — Encounter: Payer: Self-pay | Admitting: Sports Medicine

## 2022-06-19 DIAGNOSIS — M5134 Other intervertebral disc degeneration, thoracic region: Secondary | ICD-10-CM

## 2022-06-19 DIAGNOSIS — G8929 Other chronic pain: Secondary | ICD-10-CM

## 2022-06-19 DIAGNOSIS — M62838 Other muscle spasm: Secondary | ICD-10-CM | POA: Diagnosis not present

## 2022-06-19 DIAGNOSIS — M5136 Other intervertebral disc degeneration, lumbar region: Secondary | ICD-10-CM

## 2022-06-19 DIAGNOSIS — M549 Dorsalgia, unspecified: Secondary | ICD-10-CM | POA: Diagnosis not present

## 2022-06-19 NOTE — Progress Notes (Signed)
Lauren Knox - 77 y.o. female MRN [id number]  Date of birth: [birth year]  Office Visit Note: Visit Date: 06/19/2022 PCP: Lauren Perches, MD Referred by: Lauren Perches, MD  Subjective: Chief Complaint  Patient presents with   Lower Back - Follow-up   Middle Back - Follow-up   HPI: Lauren Knox is a pleasant 77 y.o. female who presents today for follow-up of thoracic and low back with muscle spasms.  Lauren Knox presents for follow-up for her thoracic pain lumbar back pain as well as her previous muscle spasms.  We last saw her 2 weeks ago when she had an acute flare we treated her with intramuscular Toradol injection.  She does state this certainly helped resolve her pain.  She has continued to have further improvement, is not requiring her Celebrex medication for a while.  Only taking her Robaxin muscle relaxer as needed.  She is walking almost every morning as well as [facility].  She does use her TENS unit for about 15 mins as well for tight muscles.  Was previously discharged from physical therapy over a month ago but is still continuing some of her home exercises with scapular retraction that we discussed today.  Pertinent ROS were reviewed with the patient and found to be negative unless otherwise specified above in HPI.   Assessment & Plan: Visit Diagnoses:  1. DDD (degenerative disc disease), lumbar   2. DDD (degenerative disc disease), thoracic   3. Trapezius muscle spasm   4. Mid back pain, chronic    Plan: Discussed with Lauren Knox the nature of her underlying degenerative disc disease as well as associated muscle spasms, patient may have improved after the injection as well as her modifying and increasing her physical activity.  She will continue her daily walking as well as her aquatic therapy 2-3 times weekly.  Elected to continue her postural changes and scapular retraction exercises at home as well.  She will continue her Robaxin 500 mg twice  daily as needed.  From my perspective, she may discontinue her Celebrex 100 mg only take this as needed for acute pain.  She was comfortable with this plan.  We will follow-up in the next 3 months, she may call or return sooner if any issues arise.  Follow-up: Return in about 3 months (around 09/13/2022) for 2.5-3 month follow-up for mid and lower back pain.   Meds & Orders: No orders of the defined types were placed in this encounter.  No orders of the defined types were placed in this encounter.    Procedures: No procedures performed      Clinical History: No specialty comments available.  She reports that she has never smoked. She has been exposed to tobacco smoke. She has never used smokeless tobacco. No results for input(s): "HGBA1C", "LABURIC" in the last 8760 hours.  Objective:   Vital Signs: There were no vitals taken for this visit.  Physical Exam  Gen: Well-appearing, in no acute distress; non-toxic CV: Well-perfused. Warm.  Resp: Breathing unlabored on room air; no wheezing. Psych: Fluid speech in conversation; appropriate affect; normal thought process Neuro: Sensation intact throughout. No gross coordination deficits.   Ortho Exam - Thoracic: No midline spinous process TTP.  There is improved hypertonicity of bilateral trapezius musculature.  Full range of motion with flexion and extension as well as rotation.  - Lumbar: No midline spinous process TTP.  Some mild restriction with endrange extension, no worsening pain.  5/5 strength in  bilateral lower extremities.  Imaging: No results found.  Past Medical/Family/Surgical/Social History: Medications & Allergies reviewed per EMR, new medications updated. 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   Muscle weakness (generalized) 09/17/2013   Scoliosis concern 09/17/2013   Stiffness of joint, not elsewhere classified, pelvic region and thigh 09/17/2013   Difficulty in walking(719.7) 09/17/2013   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   Osteopenia    Raynaud's disease    Raynaud'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 01/27/2016   Rosacea 01/27/2016   Varicose veins    Vitamin D deficiency 01/27/2016   Family History  Problem Relation Age of Onset   Cancer Mother    Cancer Father    Heart attack Father    Breast cancer Sister    Cancer Brother    Past Surgical History:  Procedure Laterality Date   ABDOMINAL HYSTERECTOMY     CESAREAN SECTION     CHOLECYSTECTOMY     COLONOSCOPY  2023   COLONOSCOPY WITH PROPOFOL N/A 09/29/2021   Procedure: COLONOSCOPY WITH PROPOFOL;  Surgeon: Jeani Hawking, MD;  Location: [facility];  Service: Gastroenterology;  Laterality: N/A;   ENDOVENOUS ABLATION SAPHENOUS VEIN W/ LASER Right 02/07/2022   endovenous laser ablation right greater saphenous vein and stab phlebectomy 10-20 incisions right leg by Cari Caraway MD   HEMOSTASIS CLIP PLACEMENT  09/29/2021   Procedure: HEMOSTASIS CLIP PLACEMENT;  Surgeon: Jeani Hawking, MD;  Location: [facility];  Service: Gastroenterology;;   INTESTINAL BLOCKAGE     POLYPECTOMY  09/29/2021   Procedure: POLYPECTOMY;  Surgeon: Jeani Hawking, MD;  Location: [facility];  Service: Gastroenterology;;   TONSILLECTOMY     Social History   Occupational History   Not on file  Tobacco Use   Smoking status: Never    Passive exposure: Past   Smokeless tobacco: Never  Vaping Use   Vaping Use: Never used  Substance and Sexual  Activity   Alcohol use: Yes    Alcohol/week: 4.0 standard drinks of alcohol    Types: 4 Glasses of wine per week    Comment: occ   Drug use: No   Sexual activity: Not on file

## 2022-06-19 NOTE — Progress Notes (Signed)
Doing good today States the IM injection last visit helped a lot

## 2022-06-29 ENCOUNTER — Other Ambulatory Visit: Payer: Self-pay | Admitting: Physician Assistant

## 2022-06-29 DIAGNOSIS — M810 Age-related osteoporosis without current pathological fracture: Secondary | ICD-10-CM

## 2022-06-29 NOTE — Telephone Encounter (Signed)
Last Fill: 03/26/2022  Labs: 05/10/2022 Calcium is elevated at 10.8. reatinine is mildly elevated. CBC is stable.   Next Visit: 11/09/2022  Last Visit: 05/10/2022  DX: Age-related osteoporosis without current pathological fracture   Current Dose per office note 05/10/2022: forteo started on 11/16/2021   Okay to refill Forteo?

## 2022-08-01 ENCOUNTER — Telehealth: Payer: Self-pay | Admitting: *Deleted

## 2022-08-01 NOTE — Telephone Encounter (Signed)
error 

## 2022-09-04 ENCOUNTER — Other Ambulatory Visit: Payer: Self-pay | Admitting: Physician Assistant

## 2022-09-04 NOTE — Telephone Encounter (Signed)
Last Fill: 11/10/2022  Next Visit: 11/09/2022  Last Visit: 05/10/2022  Dx: Age-related osteoporosis without current pathological fracture   Current Dose per office note on 05/10/2022: forteo started on 11/16/2021   Okay to refill Pen Needles?

## 2022-09-13 ENCOUNTER — Ambulatory Visit: Payer: Medicare HMO | Admitting: Sports Medicine

## 2022-09-13 ENCOUNTER — Encounter: Payer: Self-pay | Admitting: Sports Medicine

## 2022-09-13 DIAGNOSIS — M5134 Other intervertebral disc degeneration, thoracic region: Secondary | ICD-10-CM | POA: Diagnosis not present

## 2022-09-13 DIAGNOSIS — M542 Cervicalgia: Secondary | ICD-10-CM | POA: Diagnosis not present

## 2022-09-13 DIAGNOSIS — M549 Dorsalgia, unspecified: Secondary | ICD-10-CM

## 2022-09-13 DIAGNOSIS — M62838 Other muscle spasm: Secondary | ICD-10-CM | POA: Diagnosis not present

## 2022-09-13 NOTE — Progress Notes (Signed)
Patient states that she is feeling much better since the last visit. She says that she is not having any trouble with her back. She has been doing physical therapy exercises at home and has not been taking medications for pain.

## 2022-09-13 NOTE — Progress Notes (Signed)
Lauren Knox - 77 y.o. female MRN [id number]  Date of birth: 1945-01-23  Office Visit Note: Visit Date: 09/13/2022 PCP: Carylon Perches, MD Referred by: Carylon Perches, MD  Subjective: Chief Complaint  Patient presents with   Middle Back - Follow-up, Pain   Lower Back - Follow-up, Pain   HPI: Lauren Knox is a pleasant 77 y.o. female who presents today for thoracic and low back with muscle spasms.   She reports that her thoracic and trapezius/neck pain has continued to do well.  Given her improvement she is not requiring her Celebrex medication.  She will take the Robaxin muscle relaxer only as needed.  She continues to stay active at my recommendation with aquatic therapy at the [facility] twice weekly.  She is also working on her postural home exercises for the back and upper neck and scapular retraction, trying to do these once daily.  A few months ago we did perform trigger point injections and she states this gave her excellent.  Pertinent ROS were reviewed with the patient and found to be negative unless otherwise specified above in HPI.   Assessment & Plan: Visit Diagnoses:  1. DDD (degenerative disc disease), thoracic   2. Trapezius muscle spasm   3. Trigger point with back pain   4. Trigger point of neck    Plan: Mckenzi is doing well from her underlying thoracic and lumbar DDD as well as her muscular hypertonicity and trigger points.  Her posture is much improved today, I would like her to continue with postural changes as well as her scapular retraction and cervical/thoracic isometrics.  She will continue her aquatic therapy 2-3 times weekly and her daily walking.  She continues to use her TENS unit and heat, she will use her Robaxin 500 mg twice daily as needed if she has any muscle spasms or hypertonicity.  She will continue to have her Celebrex discontinued and only use for acute pain as needed.  Can always consider repeat trigger point injection down the road if does not  improve with above but given that she is doing well we will hold on this for now.  She will follow-up with me as needed.  Follow-up: Return if symptoms worsen or fail to improve.   Meds & Orders: No orders of the defined types were placed in this encounter.  No orders of the defined types were placed in this encounter.    Procedures: No procedures performed      Clinical History: No specialty comments available.  She reports that she has never smoked. She has been exposed to tobacco smoke. She has never used smokeless tobacco. No results for input(s): "HGBA1C", "LABURIC" in the last 8760 hours.  Objective:    Physical Exam  Gen: Well-appearing, in no acute distress; non-toxic CV:  Well-perfused. Warm.  Resp: Breathing unlabored on room air; no wheezing. Psych: Fluid speech in conversation; appropriate affect; normal thought process Neuro: Sensation intact throughout. No gross coordination deficits.   Ortho Exam - Cervical/Thoracic: There is no midline spinous process TTP.  There is about 5-8 degrees less of cervical extension, full range flexion.  There is good movement about the thoracic spine.  There is some mild hypertonicity of the left greater than right trapezius musculature but certainly improved from prior exams.  5/5 strength of bilateral upper extremities.  There is mild degree of cervical formication and scapular protraction but improved from prior exam.  Imaging:  - Previous imaging:   XR Lumbar Spine 2-3  Views 2 views of the lumbar spine including AP and lateral femoral ordered and  reviewed by myself.  X-rays demonstrate mild to moderate lumbar DDD from  L3 - S1.  There is at least moderate facet arthropathy around the L4 and  L5 region.  No acute bony fracture or compression of intervertebral discs.  Mild lumbar scoliosis.    Past Medical/Family/Surgical/Social History: Medications & Allergies reviewed per EMR, new medications updated. 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   Muscle weakness (generalized) 09/17/2013   Scoliosis concern 09/17/2013   Stiffness of joint, not elsewhere classified, pelvic region and thigh 09/17/2013   Difficulty in walking(719.7) 09/17/2013   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   Osteopenia    Raynaud's disease    Raynaud'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 01/27/2016   Rosacea 01/27/2016   Varicose veins    Vitamin D deficiency 01/27/2016   Family History  Problem Relation Age of Onset   Cancer Mother    Cancer Father    Heart attack Father    Breast cancer Sister    Cancer Brother    Past Surgical History:  Procedure Laterality Date   ABDOMINAL HYSTERECTOMY     CESAREAN SECTION     CHOLECYSTECTOMY     COLONOSCOPY  2023   COLONOSCOPY WITH PROPOFOL N/A 09/29/2021   Procedure: COLONOSCOPY WITH PROPOFOL;  Surgeon: Jeani Hawking, MD;  Location: [facility];  Service: Gastroenterology;  Laterality: N/A;   ENDOVENOUS ABLATION SAPHENOUS VEIN W/ LASER Right 02/07/2022   endovenous laser ablation right greater saphenous vein and stab phlebectomy 10-20 incisions right leg by Cari Caraway MD   HEMOSTASIS CLIP PLACEMENT  09/29/2021   Procedure: HEMOSTASIS CLIP PLACEMENT;  Surgeon: Jeani Hawking, MD;  Location: [facility];  Service: Gastroenterology;;   INTESTINAL BLOCKAGE     POLYPECTOMY  09/29/2021   Procedure: POLYPECTOMY;  Surgeon: Jeani Hawking, MD;  Location: [facility];  Service:  Gastroenterology;;   TONSILLECTOMY     Social History   Occupational History   Not on file  Tobacco Use   Smoking status: Never    Passive exposure: Past   Smokeless tobacco: Never  Vaping Use   Vaping status: Never Used  Substance and Sexual Activity   Alcohol use: Yes    Alcohol/week: 4.0 standard drinks of alcohol    Types: 4 Glasses of wine per week    Comment: occ   Drug use: No   Sexual activity: Not on file

## 2022-09-27 ENCOUNTER — Telehealth: Payer: Self-pay | Admitting: Sports Medicine

## 2022-09-27 NOTE — Telephone Encounter (Signed)
Patient called asked if she can be worked into Dr. Shon Baton schedule on Friday? Patient said her left shoulder is hurting pretty back and the pain is going up to her neck and down her back.  The number to contact patient is [phone]

## 2022-09-27 NOTE — Telephone Encounter (Signed)
Patient called to see if she can be worked into Dr. Shon Baton schedule tomorrow or Monday.    Patient # is [phone]

## 2022-09-28 ENCOUNTER — Ambulatory Visit: Payer: Medicare HMO | Admitting: Sports Medicine

## 2022-09-28 ENCOUNTER — Encounter: Payer: Self-pay | Admitting: Sports Medicine

## 2022-09-28 DIAGNOSIS — M542 Cervicalgia: Secondary | ICD-10-CM

## 2022-09-28 DIAGNOSIS — M62838 Other muscle spasm: Secondary | ICD-10-CM | POA: Diagnosis not present

## 2022-09-28 DIAGNOSIS — M9901 Segmental and somatic dysfunction of cervical region: Secondary | ICD-10-CM

## 2022-09-28 DIAGNOSIS — M9902 Segmental and somatic dysfunction of thoracic region: Secondary | ICD-10-CM | POA: Diagnosis not present

## 2022-09-28 DIAGNOSIS — R293 Abnormal posture: Secondary | ICD-10-CM

## 2022-09-28 MED ORDER — LIDOCAINE HCL 1 % IJ SOLN
1.0000 mL | INTRAMUSCULAR | Status: AC | PRN
Start: 2022-09-28 — End: 2022-09-28
  Administered 2022-09-28: 1 mL

## 2022-09-28 MED ORDER — BUPIVACAINE HCL 0.25 % IJ SOLN
1.0000 mL | INTRAMUSCULAR | Status: AC | PRN
Start: 2022-09-28 — End: 2022-09-28
  Administered 2022-09-28: 1 mL

## 2022-09-28 MED ORDER — METHYLPREDNISOLONE ACETATE 40 MG/ML IJ SUSP
40.0000 mg | INTRAMUSCULAR | Status: AC | PRN
Start: 2022-09-28 — End: 2022-09-28
  Administered 2022-09-28: 40 mg via INTRAMUSCULAR

## 2022-09-28 NOTE — Progress Notes (Signed)
Patient is experiencing pain and tightness in her left upper back and left shoulder, upper trapezius area. She says that she now feels pulling in her head on the left side.

## 2022-09-28 NOTE — Progress Notes (Signed)
Lauren Knox - 77 y.o. female MRN [id number]  Date of birth: [birth year]  Office Visit Note: Visit Date: 09/28/2022 PCP: Carylon Perches, MD Referred by: Carylon Perches, MD  Subjective: Chief Complaint  Patient presents with   Middle Back - Follow-up, Pain   Left Shoulder - Follow-up, Pain   HPI: Lauren Knox is a pleasant 77 y.o. female who presents today for acute on chronic thoracic and trapezius back pain.  She has been doing very well in terms of her trapezius, neck pain and thoracic pain.  Here about 1 week ago she had some spasm of the trapezius and worsening of the back and left-sided neck pain.  She has been taking her Robaxin muscle relaxer at nighttime only.  She has had to stop her postural and home exercises given the pain.  She has been applying a TENS unit and taking ibuprofen but still having rather significant pain.  Pertinent ROS were reviewed with the patient and found to be negative unless otherwise specified above in HPI.   Assessment & Plan: Visit Diagnoses:  1. Trapezius muscle spasm   2. Trigger point of neck   3. Cervical pain   4. Segmental and somatic dysfunction of thoracic region   5. Segmental and somatic dysfunction of cervical region   6. Abnormal posture    Plan: Antiana unfortunately has had an exacerbation of her chronic underlying neck and thoracic pain with associated left-sided trapezius muscle spasms and associated trigger points.  She has had this in the past and has been using Robaxin 500 mg at nighttime as well as TENS unit and other over-the-counter medications without improvement of her pain.  We did perform OMT today for the cervical and left shoulder/thoracic region, responded well.  She had responded well in the past with trigger point injections, we did proceed with this today, see procedure note below.  I would like her to continue Robaxin 500 mg nightly, apply heat twice daily and use her TENS unit twice daily throughout the weekend.   She will hold on further activities until Monday/Tuesday of next week when hopefully her pain will settle down.  We did review cervical isometrics with her in the room today, would like her to return to this and her scapular retraction with postural changes starting early next week.  Hopefully this will settle down her pain, she will follow-up with me as needed.  Ibuprofen only as needed.  Follow-up: Return if symptoms worsen or fail to improve.   Meds & Orders: No orders of the defined types were placed in this encounter.   Orders Placed This Encounter  Procedures   Trigger Point Inj     Procedures: Trigger Point Inj  Date/Time: 09/28/2022 3:53 PM  Performed by: Madelyn Brunner, DO Authorized by: Madelyn Brunner, DO   Consent Given by:  Patient Site marked: the procedure site was marked   Timeout: prior to procedure the correct patient, procedure, and site was verified   Indications:  Muscle spasm and pain Total # of Trigger Points:  3 or more Location: neck and shoulder   Needle Size:  25 G Approach:  Dorsal Medications #1:  1 mL lidocaine 1 %; 1 mL bupivacaine 0.25 %; 40 mg methylPREDNISolone acetate 40 MG/ML Medications #2:  1 mL lidocaine 1 %; 1 mL bupivacaine 0.25 % Medications #3:  1 mL lidocaine 1 %; 1 mL bupivacaine 0.25 % Additional Injections?: No   Patient tolerance:  Patient tolerated the procedure well with no  immediate complications Comments: Procedure: Trigger point injection, left cervical, trapezius, medial scapular border After discussion on R/B/I and informed verbal consent was obtained, a timeout was conducted. The patient was placed in a prone position on the examination table and the area of maximal tenderness was identified over the cervical/trapezius soft tissue and medial scapular border.  This area was cleansed with Betadine and multiple alcohol swabs. Ethyl chloride was used for local anesthesia. Using a 25-gauge 1.0 inch needle the trigger point(s) was  subsequently injected with a mixture of 1 cc of methylprednisolone 40 mg/mL and 2.5 cc of 1% lidocaine without epinephrine and 2.5 cc of bupivicaine, with a total of 2cc of injectate into each trigger point (3). A band-aid was applied following. Patient tolerated procedure well, there were no post-injection complications. Post-procedure instructions were given.         - OMT performed: *Cervical: Strain-counterstrain, Muscle Energy, Myofascial release *Thoracic/Trapezius: Strain-counterstrain, Myofascial release  Clinical History: No specialty comments available.  She reports that she has never smoked. She has been exposed to tobacco smoke. She has never used smokeless tobacco. No results for input(s): "HGBA1C", "LABURIC" in the last 8760 hours.  Objective:    Physical Exam  Gen: Well-appearing, in no acute distress; non-toxic CV:  Well-perfused. Warm.  Resp: Breathing unlabored on room air; no wheezing. Psych: Fluid speech in conversation; appropriate affect; normal thought process Neuro: Sensation intact throughout. No gross coordination deficits.   Ortho Exam - Cervical/Thoracic: + TTP with associated muscular hypertonicity on the left cervical paraspinal musculature as well as the left trapezius.  There is no scapular winging.  There is trigger points associated over the medial scapular border and the left trapezius muscle.  There are some generalized soft tissue swelling of the left trapezius.   *Osteopathic Exam: -Cervical: There is somatic dysfunction of the left sided paraspinal hypertonicity.  C6, flexion, rotated left, side bent left -Thoracic/shoulder: Reciprocal hypertonicity of the left trapezius and medial scapular border.  Thoracic group dysfunction T4-T6 flexed rotated right, side bent right.  Imaging: No results found.  Past Medical/Family/Surgical/Social History: Medications & Allergies reviewed per EMR, new medications updated. 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   Muscle weakness (generalized) 09/17/2013   Scoliosis concern 09/17/2013   Stiffness of joint, not elsewhere classified, pelvic region and thigh 09/17/2013   Difficulty in walking(719.7) 09/17/2013   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   Osteopenia    Raynaud's disease    Raynaud'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 01/27/2016   Rosacea 01/27/2016   Varicose veins    Vitamin D deficiency 01/27/2016   Family History  Problem Relation Age of Onset   Cancer Mother    Cancer Father    Heart attack Father    Breast cancer Sister    Cancer Brother    Past Surgical History:  Procedure Laterality Date   ABDOMINAL HYSTERECTOMY     CESAREAN SECTION     CHOLECYSTECTOMY     COLONOSCOPY  2023   COLONOSCOPY WITH PROPOFOL N/A 09/29/2021  Procedure: COLONOSCOPY WITH PROPOFOL;  Surgeon: Jeani Hawking, MD;  Location: [facility];  Service: Gastroenterology;  Laterality: N/A;   ENDOVENOUS ABLATION SAPHENOUS VEIN W/ LASER Right 02/07/2022   endovenous laser ablation right greater saphenous vein and stab phlebectomy 10-20 incisions right leg by Cari Caraway MD   HEMOSTASIS CLIP PLACEMENT  09/29/2021   Procedure: HEMOSTASIS CLIP PLACEMENT;  Surgeon: Jeani Hawking, MD;  Location: [facility];  Service: Gastroenterology;;   INTESTINAL BLOCKAGE     POLYPECTOMY  09/29/2021   Procedure: POLYPECTOMY;  Surgeon: Jeani Hawking, MD;  Location: [facility];  Service: Gastroenterology;;    TONSILLECTOMY     Social History   Occupational History   Not on file  Tobacco Use   Smoking status: Never    Passive exposure: Past   Smokeless tobacco: Never  Vaping Use   Vaping status: Never Used  Substance and Sexual Activity   Alcohol use: Yes    Alcohol/week: 4.0 standard drinks of alcohol    Types: 4 Glasses of wine per week    Comment: occ   Drug use: No   Sexual activity: Not on file

## 2022-10-01 ENCOUNTER — Other Ambulatory Visit: Payer: Self-pay | Admitting: Sports Medicine

## 2022-10-01 ENCOUNTER — Telehealth: Payer: Self-pay | Admitting: Sports Medicine

## 2022-10-01 MED ORDER — TIZANIDINE HCL 2 MG PO TABS: 2.0000 mg | ORAL_TABLET | Freq: Every day | ORAL | 1 refills | Status: DC

## 2022-10-01 NOTE — Telephone Encounter (Signed)
Pt called stating to give her a call regarding her medication tizanidine please.  Malva Cogan [phone]

## 2022-10-08 ENCOUNTER — Telehealth: Payer: Self-pay | Admitting: Sports Medicine

## 2022-10-08 NOTE — Telephone Encounter (Signed)
Pt would like a call discussing pain and what she needs to do for it , pain has moved into her neck and shoulders

## 2022-10-10 ENCOUNTER — Other Ambulatory Visit: Payer: Self-pay | Admitting: Sports Medicine

## 2022-10-10 MED ORDER — PREDNISONE 20 MG PO TABS: 20.0000 mg | ORAL_TABLET | Freq: Every day | ORAL | 0 refills | Status: DC

## 2022-10-11 ENCOUNTER — Ambulatory Visit: Payer: Medicare HMO | Admitting: Sports Medicine

## 2022-10-16 ENCOUNTER — Other Ambulatory Visit: Payer: Self-pay | Admitting: Sports Medicine

## 2022-10-16 ENCOUNTER — Telehealth: Payer: Self-pay | Admitting: Sports Medicine

## 2022-10-16 MED ORDER — CELECOXIB 100 MG PO CAPS: 100.0000 mg | ORAL_CAPSULE | Freq: Two times a day (BID) | ORAL | 0 refills | Status: AC

## 2022-10-16 NOTE — Telephone Encounter (Signed)
Pt called in stating she is finish with Prednisone and was told to let us know so she can get Celebrex pt stated she is not in pain like she was but still sore and would like someone to call and let her know if the Rx is going to be put in

## 2022-10-24 ENCOUNTER — Other Ambulatory Visit: Payer: Self-pay | Admitting: *Deleted

## 2022-10-24 DIAGNOSIS — M810 Age-related osteoporosis without current pathological fracture: Secondary | ICD-10-CM

## 2022-10-24 MED ORDER — TERIPARATIDE 600 MCG/2.4ML ~~LOC~~ SOPN
20.0000 ug | PEN_INJECTOR | Freq: Every day | SUBCUTANEOUS | 0 refills | Status: DC
Start: 2022-10-24 — End: 2022-11-02

## 2022-10-24 NOTE — Telephone Encounter (Signed)
Patient contacted the office to request a medication refill.   1. Name of Medication: Forteo  2. How are you currently taking this medication (dosage and times per day)? 20 mcg SQ daily.   3. What pharmacy would you like for that to be sent to? [facility]

## 2022-10-24 NOTE — Telephone Encounter (Signed)
Last Fill: 06/29/2022   Labs: 05/10/2022 Calcium is elevated at 10.8. reatinine is mildly elevated. CBC is stable.    Next Visit: 11/09/2022   Last Visit: 05/10/2022   DX: Age-related osteoporosis without current pathological fracture    Current Dose per office note 05/10/2022: forteo started on 11/16/2021    Okay to refill Forteo?

## 2022-10-26 NOTE — Progress Notes (Signed)
Office Visit Note  Patient: Lauren Knox             Date of Birth: 1945-07-10           MRN: [id number]             PCP: Carylon Perches, MD Referring: Carylon Perches, MD Visit Date: 11/09/2022 Occupation: @GUAROCC @  Subjective:  Lower back pain  History of Present Illness: Lauren Knox is a 77 y.o. female with osteoarthritis, degenerative disc disease and osteoporosis.  She states she continues to have some discomfort in her back.  She has seen Dr. Shon Baton in the past who has done injections. She is cannot get another injection right now.  Quite discomfort in the right lumbar region.  She said Dr. Shon Baton gave her Celebrex 100 mg p.o. which she has been taking twice a day.  She currently does not have much discomfort in her hips knees or feet.  She continues to take Forteo injections.  She continues to take vitamin D.    Activities of Daily Living:  Patient reports morning stiffness for 5  minutes.   Patient Reports nocturnal pain.  Difficulty dressing/grooming: Denies Difficulty climbing stairs: Denies Difficulty getting out of chair: Denies Difficulty using hands for taps, buttons, cutlery, and/or writing: Denies  Review of Systems  Constitutional:  Negative for fatigue.  HENT:  Positive for mouth dryness.   Eyes:  Positive for dryness.  Respiratory:  Negative for shortness of breath.   Cardiovascular:  Negative for chest pain and palpitations.  Gastrointestinal:  Negative for blood in stool, constipation and diarrhea.  Endocrine: Negative for increased urination.  Genitourinary:  Negative for decreased urine output.  Musculoskeletal:  Positive for joint pain, joint pain, myalgias, morning stiffness and myalgias.  Skin:  Negative for color change, rash and sensitivity to sunlight.  Allergic/Immunologic: Negative for susceptible to infections.  Neurological:  Negative for dizziness.  Hematological:  Negative for swollen glands.  Psychiatric/Behavioral:  Positive for  sleep disturbance. Negative for depressed mood. The patient is not nervous/anxious.     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   Muscle weakness (generalized) 09/17/2013   Scoliosis concern 09/17/2013   Stiffness of joint, not elsewhere classified, pelvic region and thigh 09/17/2013   Difficulty walking 09/17/2013    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   Osteopenia    Raynaud's disease    Raynaud'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 01/27/2016   Rosacea 01/27/2016   Varicose veins    Vitamin D deficiency 01/27/2016    Family History  Problem Relation Age of Onset   Cancer Mother    Cancer Father    Heart attack Father    Breast cancer Sister    Cancer Brother    Past Surgical History:  Procedure Laterality Date   ABDOMINAL HYSTERECTOMY     CESAREAN SECTION     CHOLECYSTECTOMY     COLONOSCOPY  2023   COLONOSCOPY WITH PROPOFOL N/A 09/29/2021   Procedure: COLONOSCOPY WITH PROPOFOL;  Surgeon:  Jeani Hawking, MD;  Location: [facility];  Service: Gastroenterology;  Laterality: N/A;   ENDOVENOUS ABLATION SAPHENOUS VEIN W/ LASER Right 02/07/2022   endovenous laser ablation right greater saphenous vein and stab phlebectomy 10-20 incisions right leg by Cari Caraway MD   HEMOSTASIS CLIP PLACEMENT  09/29/2021   Procedure: HEMOSTASIS CLIP PLACEMENT;  Surgeon: Jeani Hawking, MD;  Location: [facility];  Service: Gastroenterology;;   INTESTINAL BLOCKAGE      POLYPECTOMY  09/29/2021   Procedure: POLYPECTOMY;  Surgeon: Jeani Hawking, MD;  Location: [facility];  Service: Gastroenterology;;   TONSILLECTOMY     Social History   Social History Narrative   Not on file   Immunization History  Administered Date(s) Administered   PFIZER(Purple Top)SARS-COV-2 Vaccination 02/21/2019, 03/14/2019, 11/19/2019     Objective: Vital Signs: Wt 137 lb (62.1 kg)   BMI 23.89 kg/m    Physical Exam Vitals and nursing note reviewed.  Constitutional:      Appearance: She is well-developed.  HENT:     Head: Normocephalic and atraumatic.  Eyes:     Conjunctiva/sclera: Conjunctivae normal.  Cardiovascular:     Rate and Rhythm: Normal rate and regular rhythm.     Heart sounds: Normal heart sounds.  Pulmonary:     Effort: Pulmonary effort is normal.     Breath sounds: Normal breath sounds.  Abdominal:     General: Bowel sounds are normal.     Palpations: Abdomen is soft.  Musculoskeletal:     Cervical back: Normal range of motion.  Lymphadenopathy:     Cervical: No cervical adenopathy.  Skin:    General: Skin is warm and dry.     Capillary Refill: Capillary refill takes less than 2 seconds.  Neurological:     Mental Status: She is alert and oriented to person, place, and time.  Psychiatric:        Behavior: Behavior normal.      Musculoskeletal Exam: Patient had limited lateral rotation of the cervical spine with some discomfort.  She had good range of motion of thoracic and lumbar spine with discomfort range of motion of her lumbar spine.  She had no point tenderness.  Shoulders, elbows, wrist joints, MCPs PIPs and DIPs with good range of motion.  She had bilateral PIP and DIP thickening.  Hip joints and knee joints with good range of motion.  There was no tenderness over ankles or MTPs.  CDAI Exam: CDAI Score: -- Patient Global: --; Provider Global: -- Swollen: --; Tender: -- Joint Exam 11/09/2022   No joint exam has been documented for  this visit   There is currently no information documented on the homunculus. Go to the Rheumatology activity and complete the homunculus joint exam.  Investigation: No additional findings.  Imaging: No results found.  Recent Labs: Lab Results  Component Value Date   WBC 9.6 05/10/2022   HGB 13.8 05/10/2022   PLT 441 (H) 05/10/2022   NA 138 05/10/2022   K 4.8 05/10/2022   CL 103 05/10/2022   CO2 26 05/10/2022   GLUCOSE 94 05/10/2022   BUN 27 (H) 05/10/2022   CREATININE 1.02 (H) 05/10/2022   BILITOT 0.5 05/10/2022   AST 19 05/10/2022   ALT 10 05/10/2022   PROT 6.7 05/10/2022   CALCIUM 10.8 (H) 05/10/2022   GFRAA 67 11/27/2019    Speciality Comments: Reclast - 04/30/16, 11/13/19,11/17/20 Forteo started 11/16/21  Procedures:  No procedures performed Allergies: Codeine and Lisinopril   Assessment / Plan:  Visit Diagnoses: Primary osteoarthritis of both hands-she has bilateral PIP and DIP thickening consistent with osteoarthritis.  She denies any discomfort today.  No synovitis was noted.  Joint protection was discussed.  Primary osteoarthritis of both feet-she denies any discomfort in her feet today.  Proper fitting shoes were advised.  DDD (degenerative disc disease), cervical -she had limited range of motion of her cervical spine with some discomfort.  She had physical therapy in the past which was helpful.  She has been seeing Dr. Shon Baton.  Trapezius muscle spasm - methocarbamol 500 mg 1 tablet daily as needed for muscle spasms and tizanidine 2 mg at bedtime as needed for muscle spasms.  DDD (degenerative disc disease), thoracic-she had no discomfort in the thoracic region today.  Postural kyphosis of thoracolumbar region  Spondylosis of lumbar spine - She goes to Dr. Shon Baton.  Patient states she has recurrence of pain in the right lower lumbar region.  She had no point tenderness.  She has been taking Celebrex 100 mg p.o. twice daily on a regular basis which is not  helping her.  I advised her to discontinue Celebrex as her creatinine has been elevated in the past.  Will check her labs today.  She may take Tylenol for pain relief.  Patient was encouraged to do core strengthening exercises.  Trochanteric bursitis of right hip-improved.  Raynaud'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-currently asymptomatic.  Age-related osteoporosis without current pathological fracture - DEXA 10/10/2021: Right femoral neck T score -2.6, left femoral neck T score -2.5. forteo started 11/16/2021.  She had Reclast prior to [facility] in 2018, 2021, 2022.  Calcium was a stopped due to hypercalcemia.  She has been taking vitamin D.  Will check vitamin D level today.  Vitamin D deficiency-vitamin D was low at 79 on May 10, 2022.  Will check vitamin D level today.  Medication monitoring encounter-will check CMP with GFR.  History of attention deficit disorder  History of rosacea  Orders: No orders of the defined types were placed in this encounter.  No orders of the defined types were placed in this encounter.    Follow-Up Instructions: Return for Osteoarthritis.   Pollyann Savoy, MD  Note - This record has been created using Animal nutritionist.  Chart creation errors have been sought, but may not always  have been located. Such creation errors do not reflect on  the standard of medical care.

## 2022-11-02 ENCOUNTER — Other Ambulatory Visit: Payer: Self-pay | Admitting: *Deleted

## 2022-11-02 DIAGNOSIS — M810 Age-related osteoporosis without current pathological fracture: Secondary | ICD-10-CM

## 2022-11-02 MED ORDER — TERIPARATIDE 600 MCG/2.4ML ~~LOC~~ SOPN: 20.0000 ug | PEN_INJECTOR | Freq: Every day | SUBCUTANEOUS | 0 refills | Status: DC

## 2022-11-09 ENCOUNTER — Encounter: Payer: Self-pay | Admitting: Rheumatology

## 2022-11-09 ENCOUNTER — Ambulatory Visit: Payer: Medicare HMO | Attending: Rheumatology | Admitting: Rheumatology

## 2022-11-09 VITALS — BP 157/80 | HR 54 | Resp 14 | Ht 63.5 in | Wt 137.0 lb

## 2022-11-09 DIAGNOSIS — M5030 Other cervical disc degeneration, unspecified cervical region: Secondary | ICD-10-CM | POA: Diagnosis not present

## 2022-11-09 DIAGNOSIS — M47816 Spondylosis without myelopathy or radiculopathy, lumbar region: Secondary | ICD-10-CM

## 2022-11-09 DIAGNOSIS — M19041 Primary osteoarthritis, right hand: Secondary | ICD-10-CM | POA: Diagnosis not present

## 2022-11-09 DIAGNOSIS — M19042 Primary osteoarthritis, left hand: Secondary | ICD-10-CM

## 2022-11-09 DIAGNOSIS — M19071 Primary osteoarthritis, right ankle and foot: Secondary | ICD-10-CM | POA: Diagnosis not present

## 2022-11-09 DIAGNOSIS — Z5181 Encounter for therapeutic drug level monitoring: Secondary | ICD-10-CM

## 2022-11-09 DIAGNOSIS — I73 Raynaud'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: Secondary | ICD-10-CM

## 2022-11-09 DIAGNOSIS — M5134 Other intervertebral disc degeneration, thoracic region: Secondary | ICD-10-CM

## 2022-11-09 DIAGNOSIS — Z8659 Personal history of other mental and behavioral disorders: Secondary | ICD-10-CM

## 2022-11-09 DIAGNOSIS — E559 Vitamin D deficiency, unspecified: Secondary | ICD-10-CM

## 2022-11-09 DIAGNOSIS — M7061 Trochanteric bursitis, right hip: Secondary | ICD-10-CM

## 2022-11-09 DIAGNOSIS — M4005 Postural kyphosis, thoracolumbar region: Secondary | ICD-10-CM

## 2022-11-09 DIAGNOSIS — M19072 Primary osteoarthritis, left ankle and foot: Secondary | ICD-10-CM

## 2022-11-09 DIAGNOSIS — M62838 Other muscle spasm: Secondary | ICD-10-CM | POA: Diagnosis not present

## 2022-11-09 DIAGNOSIS — M810 Age-related osteoporosis without current pathological fracture: Secondary | ICD-10-CM

## 2022-11-09 DIAGNOSIS — Z872 Personal history of diseases of the skin and subcutaneous tissue: Secondary | ICD-10-CM

## 2022-11-10 LAB — COMPLETE METABOLIC PANEL WITH GFR
AG Ratio: 1.4 (calc) (ref 1.0–2.5)
ALT: 12 U/L (ref 6–29)
AST: 24 U/L (ref 10–35)
Albumin: 4.2 g/dL (ref 3.6–5.1)
Alkaline phosphatase (APISO): 114 U/L (ref 37–153)
BUN/Creatinine Ratio: 27 (calc) — ABNORMAL HIGH (ref 6–22)
BUN: 28 mg/dL — ABNORMAL HIGH (ref 7–25)
CO2: 29 mmol/L (ref 20–32)
Calcium: 10.6 mg/dL — ABNORMAL HIGH (ref 8.6–10.4)
Chloride: 104 mmol/L (ref 98–110)
Creat: 1.03 mg/dL — ABNORMAL HIGH (ref 0.60–1.00)
Globulin: 2.9 g/dL (ref 1.9–3.7)
Glucose, Bld: 88 mg/dL (ref 65–99)
Potassium: 5 mmol/L (ref 3.5–5.3)
Sodium: 140 mmol/L (ref 135–146)
Total Bilirubin: 0.6 mg/dL (ref 0.2–1.2)
Total Protein: 7.1 g/dL (ref 6.1–8.1)
eGFR: 57 mL/min/{1.73_m2} — ABNORMAL LOW (ref >=60)

## 2022-11-10 LAB — VITAMIN D 25 HYDROXY (VIT D DEFICIENCY, FRACTURES): Vit D, 25-Hydroxy: 28 ng/mL — ABNORMAL LOW (ref 30–100)

## 2022-11-11 NOTE — Progress Notes (Signed)
Creatinine remains mildly elevated.  Patient should avoid taking Celebrex or any other NSAIDs.  Calcium remains mildly elevated, vitamin D is low at 28.  Should avoid calcium supplement and take vitamin D 1000 units daily.

## 2022-11-13 ENCOUNTER — Telehealth: Payer: Self-pay | Admitting: Sports Medicine

## 2022-11-13 NOTE — Telephone Encounter (Signed)
Patient called and said that he prescribe her medication that isn't good for her kidneys so can she get something else. [phone]

## 2022-11-14 NOTE — Telephone Encounter (Signed)
Called patient to advise to stop Celebrex - she was okay with this. She will continue taking Tylenol and Robaxin, as well as using heat and TENS unit. She is inquiring about timing for future injections if her pain returns.

## 2022-11-16 NOTE — Telephone Encounter (Signed)
Called Lauren Knox to follow up about timing of repeat trigger point injections. She understands that they are ideally done infrequently, but if her pain returns and is bad again she can get more injections. She says that she will continue to take the muscle relaxer and Tylenol, as well as heat, TENS unit, and stretching.

## 2022-12-03 ENCOUNTER — Ambulatory Visit: Payer: Medicare HMO | Admitting: Sports Medicine

## 2022-12-03 ENCOUNTER — Encounter: Payer: Self-pay | Admitting: Sports Medicine

## 2022-12-03 DIAGNOSIS — M62838 Other muscle spasm: Secondary | ICD-10-CM | POA: Diagnosis not present

## 2022-12-03 DIAGNOSIS — M549 Dorsalgia, unspecified: Secondary | ICD-10-CM

## 2022-12-03 DIAGNOSIS — M5134 Other intervertebral disc degeneration, thoracic region: Secondary | ICD-10-CM

## 2022-12-03 DIAGNOSIS — N1831 Chronic kidney disease, stage 3a: Secondary | ICD-10-CM | POA: Diagnosis not present

## 2022-12-03 MED ORDER — PREDNISONE 10 MG PO TABS: ORAL_TABLET | ORAL | 0 refills | Status: DC

## 2022-12-03 NOTE — Progress Notes (Signed)
Lauren Knox - 77 y.o. female MRN [id number]  Date of birth: [birth year]  Office Visit Note: Visit Date: 12/03/2022 PCP: Carylon Perches, MD Referred by: Carylon Perches, MD  Subjective: Chief Complaint  Patient presents with   Middle Back - Pain   HPI: Lauren Knox is a pleasant 77 y.o. female who presents today for acute on chronic mid and R-sided low back pain with associated muscle spasming.  Lauren Knox is dealing with an exacerbation of her mid and lower right-sided back pain.  She has associated muscle spasms with this.  She has had issues in the past but had been doing well with her postural exercises as well as doing some aquatic therapy in the pool.  Here over the last month or so her pain has worsened and became rather bothersome that it was keeping her from sleeping over the weekend.  She is using tizanidine twice daily as needed as well as Tylenol.  She does use a TENS unit as well.  He is asking questions about a Thera-gun.  She denies any dysuria, cloudy or abnormal odor.  She saw her rheumatologist who noted a mild increase in her creatinine, was cautioned on her NSAID use.  Lab Results  Component Value Date   CREATININE 1.03 (H) 11/09/2022   Pertinent ROS were reviewed with the patient and found to be negative unless otherwise specified above in HPI.   Assessment & Plan: Visit Diagnoses:  1. DDD (degenerative disc disease), thoracic   2. Trigger point with back pain   3. Muscle spasm   4. Stage 3a chronic kidney disease (HCC)    Plan: Lauren Knox is dealing with an exacerbation of her chronic mid and right-sided low back pain with associated muscle spasms. She has dealt with this in the past and has been able to manage these at home with her regimen of muscle relaxer, heat, TENS unit.  She has been having some difficulty over the last month in the last few days her pain is worse and that is now keeping her up at night. We will start her on a low-dose of prednisone 20 mg  x 5 days, followed by 10 mg for additional days.  We will discontinue her Celebrex given her mild bump in serum creatinine.  She will continue her muscle relaxer, heat and TENS unit as needed.  We discussed the association of muscle spasms with stiffness today and remaining in a fixed position for long periods of time, she is understanding with needed to get up and move.  I would like to incorporate some rehab exercises for her, she is agreeable to home therapy.  We did print out some McKenzie extension exercises and postural rehab exercises for her to do at home. My athletic trainer, Isabelle Course, did review these in the room with her today.  She will perform these once to twice daily.  I am fine with her trying Thera-gun as this may help with the spasming and tightness of her muscles.  We will see her back in about 2 weeks, hopefully she is doing better at that time.  If she still needs additional help, could consider trigger point injections at that time.  Follow-up: Return in about 2 weeks (around 12/17/2022) for for low back/muscle spasm (30-min poss TP injs).   Meds & Orders:  Meds ordered this encounter  Medications   predniSONE (DELTASONE) 10 MG tablet    Sig: Take 2 tablets (20-mg total) for 5 days, then take 1 tablet (10-mg  total) for the next 5 days until completion.    Dispense:  15 tablet    Refill:  0   No orders of the defined types were placed in this encounter.    Procedures: No procedures performed      Clinical History: No specialty comments available.  She reports that she has never smoked. She has been exposed to tobacco smoke. She has never used smokeless tobacco. No results for input(s): "HGBA1C", "LABURIC" in the last 8760 hours.  Objective:    Physical Exam  Gen: Well-appearing, in no acute distress; non-toxic CV: Well-perfused. Warm.  Resp: Breathing unlabored on room air; no wheezing. Psych: Fluid speech in conversation; appropriate affect; normal thought  process Neuro: Sensation intact throughout. No Knox coordination deficits.   Ortho Exam - Thoracic/Lumbar region: No midline spinous process TTP.  There is right-sided thoracic and upper lumbar paraspinal hypertonicity with associated trigger points.  She has full range of motion with flexion and extension of the thoracic and lumbar spine.  There is no gait abnormality.  Imaging: MRI THORACIC SPINE FINDINGS   Alignment:  Physiologic.   Vertebrae: No acute or suspicious osseous findings.   Cord: The thoracic cord appears normal in signal and caliber.The conus medullaris extends to the mid L1 level.   Paraspinal and other soft tissues: No significant paraspinal abnormalities.   Disc levels:   The thoracic spinal canal is widely patent. There is mild disc bulging and endplate osteophyte formation throughout the thoracic spine. No significant disc herniation, foraminal narrowing or nerve root encroachment identified. Localizing images of the lumbar spine demonstrate mild multilevel spondylosis without evidence of high-grade spinal stenosis.   IMPRESSION: 1. No acute findings or explanation for the patient's symptoms in the cervical or thoracic spine. 2. Stable multilevel cervical spondylosis with disc bulging, uncinate spurring and facet hypertrophy as described. No significant spinal stenosis or nerve root encroachment. 3. Mild thoracic spondylosis without significant disc herniation, foraminal narrowing or nerve root encroachment. 4. No acute osseous findings. Normal appearance of the cervicothoracic cord.     Electronically Signed   By: Carey Bullocks M.D.   On: 04/13/2022 18:55  Past Medical/Family/Surgical/Social History: Medications & Allergies reviewed per EMR, new medications updated. 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   Muscle weakness (generalized) 09/17/2013   Scoliosis concern 09/17/2013   Stiffness of joint, not elsewhere classified, pelvic region and thigh 09/17/2013   Difficulty walking 09/17/2013   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   Osteopenia    Raynaud's disease    Raynaud'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 01/27/2016   Rosacea 01/27/2016   Varicose veins    Vitamin D deficiency 01/27/2016   Family History  Problem Relation Age of Onset   Cancer Mother    Cancer Father    Heart attack Father    Breast cancer Sister    Cancer Brother    Past Surgical History:  Procedure Laterality Date   ABDOMINAL HYSTERECTOMY     CESAREAN SECTION     CHOLECYSTECTOMY  COLONOSCOPY  2023   COLONOSCOPY WITH PROPOFOL N/A 09/29/2021   Procedure: COLONOSCOPY WITH PROPOFOL;  Surgeon: Jeani Hawking, MD;  Location: [facility];  Service: Gastroenterology;  Laterality: N/A;   ENDOVENOUS ABLATION SAPHENOUS VEIN W/ LASER Right 02/07/2022   endovenous laser ablation right greater saphenous vein and stab phlebectomy 10-20 incisions right leg by Cari Caraway MD   HEMOSTASIS CLIP PLACEMENT  09/29/2021   Procedure: HEMOSTASIS CLIP PLACEMENT;  Surgeon: Jeani Hawking, MD;  Location: [facility];  Service: Gastroenterology;;   INTESTINAL BLOCKAGE     POLYPECTOMY  09/29/2021   Procedure: POLYPECTOMY;  Surgeon: Jeani Hawking, MD;  Location: [facility];  Service: Gastroenterology;;   TONSILLECTOMY     Social History   Occupational History   Not on file  Tobacco Use   Smoking status: Never    Passive exposure: Past   Smokeless tobacco:  Never  Vaping Use   Vaping status: Never Used  Substance and Sexual Activity   Alcohol use: Yes    Alcohol/week: 4.0 standard drinks of alcohol    Types: 4 Glasses of wine per week    Comment: occ   Drug use: No   Sexual activity: Not on file

## 2022-12-03 NOTE — Progress Notes (Signed)
Patient says that she has had trouble with back pain for the last month. She says that this weekend it was especially bad with spasms on the right side of her mid back. She says that she has tried ice and heat and they are not helping. She is also taking her muscle relaxer and Advil. She says that she tries to get up and walk around but cannot get the spasms to stop.  Patient was instructed in 10 minutes of therapeutic exercises for back to improve strength, ROM and function according to my instructions and plan of care by a Certified Athletic Trainer during the office visit. A customized handout was provided and demonstration of proper technique shown and discussed. Patient did perform exercises and demonstrate understanding through teachback.  All questions discussed and answered.

## 2022-12-17 ENCOUNTER — Ambulatory Visit: Payer: Medicare HMO | Admitting: Sports Medicine

## 2022-12-17 ENCOUNTER — Encounter: Payer: Self-pay | Admitting: Sports Medicine

## 2022-12-17 DIAGNOSIS — M549 Dorsalgia, unspecified: Secondary | ICD-10-CM | POA: Diagnosis not present

## 2022-12-17 DIAGNOSIS — M25511 Pain in right shoulder: Secondary | ICD-10-CM

## 2022-12-17 DIAGNOSIS — M7918 Myalgia, other site: Secondary | ICD-10-CM

## 2022-12-17 DIAGNOSIS — M62838 Other muscle spasm: Secondary | ICD-10-CM

## 2022-12-17 DIAGNOSIS — M5134 Other intervertebral disc degeneration, thoracic region: Secondary | ICD-10-CM

## 2022-12-17 DIAGNOSIS — M4004 Postural kyphosis, thoracic region: Secondary | ICD-10-CM

## 2022-12-17 MED ORDER — LIDOCAINE HCL 1 % IJ SOLN
1.0000 mL | INTRAMUSCULAR | Status: AC | PRN
  Administered 2022-12-17: 1 mL

## 2022-12-17 MED ORDER — BUPIVACAINE HCL 0.25 % IJ SOLN
1.0000 mL | INTRAMUSCULAR | Status: AC | PRN
Start: 2022-12-17 — End: 2022-12-17
  Administered 2022-12-17: 1 mL

## 2022-12-17 MED ORDER — BUPIVACAINE HCL 0.25 % IJ SOLN
1.0000 mL | INTRAMUSCULAR | Status: AC | PRN
  Administered 2022-12-17: 1 mL

## 2022-12-17 MED ORDER — METHYLPREDNISOLONE ACETATE 40 MG/ML IJ SUSP
40.0000 mg | INTRAMUSCULAR | Status: AC | PRN
  Administered 2022-12-17: 40 mg via INTRAMUSCULAR

## 2022-12-17 MED ORDER — TIZANIDINE HCL 2 MG PO TABS: 2.0000 mg | ORAL_TABLET | Freq: Every evening | ORAL | 1 refills | Status: DC | PRN

## 2022-12-17 NOTE — Progress Notes (Signed)
Patient says that she had pain and spasming last Wednesday and Thursday. She says that she laid in bed Friday through the weekend and today it is not so bad. She says that she was doing the home exercises from last visit but they were painful so she stopped. She is finished with the Prednisone, and continues to use heat. She says that she feels okay laying flat on her back, but otherwise needs the heating pad.

## 2022-12-17 NOTE — Progress Notes (Signed)
Lauren Knox - 77 y.o. female MRN [id number]  Date of birth: December 12, 1945  Office Visit Note: Visit Date: 12/17/2022 PCP: Carylon Perches, MD Referred by: Carylon Perches, MD  Subjective: Chief Complaint  Patient presents with   Middle Back - Follow-up   HPI: Lauren Knox is a pleasant 77 y.o. female who presents today for acute on chronic mid and R-sided low back pain with associated muscle spasming.   Her pain is better than it was 2 weeks ago.  She finished the low-dose of prednisone.  She has been using tizanidine a few times a day as needed when her muscle spasms come about, this was worse around Thanksgiving time.  She does continue to do the home exercises but when her spasming comes about she holds from this.  She is wondering if she is using/activating the right muscles.  She has not gotten into the pool recently because the pool heater was turned off and then preparing for holiday time.  In the past she had received good relief from formalized physical therapy, although stopped this at the end of March/early May.  Using Tylenol for breakthrough pain.  Pain localizes to the inferior aspect of the scapula as well as the right > left lumbar paraspinal musculature.  Pertinent ROS were reviewed with the patient and found to be negative unless otherwise specified above in HPI.   Assessment & Plan: Visit Diagnoses:  1. Muscle spasm   2. Trigger point with back pain   3. Trigger point of right shoulder region   4. DDD (degenerative disc disease), thoracic   5. Muscle pain, lumbar   6. Postural kyphosis of thoracic region    Plan: Discussed with Claris Che the nature of her acute on chronic right-sided mid and low back pain with associated muscle spasms.  I think this is a combination of her postural change with exaggerated thoracic kyphosis as well as muscle and activity.  She did better in the past when she was in formalized physical therapy and performing aquatic therapy at the [facility].   We will resend referral for PT today to help with postural changes, posterior chain strengthening and soft tissue techniques as needed.  She will start going back to aquatic therapy at the [facility].  She does find relief with tizanidine 2 mg nightly as needed, I did refill this today.  She will discontinue her prednisone and any NSAIDs given her mild creatinine bump previously.  I am okay with Tylenol for breakthrough pain as needed.  She has received good relief with trigger point injections in the past and given her associated trigger points on exam today, we did repeat these today, see procedure note below.  We will see her back in about 6 weeks for reevaluation at that time.  Follow-up: Return in about 6 weeks (around 01/28/2023) for for low back/muscle spasms.   Meds & Orders:  Meds ordered this encounter  Medications   tiZANidine (ZANAFLEX) 2 MG tablet    Sig: Take 1 tablet (2 mg total) by mouth at bedtime as needed for up to 60 doses for muscle spasms. Take at nighttime.    Dispense:  30 tablet    Refill:  1    Orders Placed This Encounter  Procedures   Trigger Point Inj   Ambulatory referral to Physical Therapy     Procedures: Trigger Point Inj  Date/Time: 12/17/2022 1:23 PM  Performed by: Madelyn Brunner, DO Authorized by: Madelyn Brunner, DO   Consent Given by:  Patient Site marked: the procedure site was marked   Timeout: prior to procedure the correct patient, procedure, and site was verified   Indications:  Muscle spasm Total # of Trigger Points:  3 or more Location: back and shoulder   Needle Size:  25 G Approach:  Dorsal Medications #1:  1 mL lidocaine 1 %; 1 mL bupivacaine 0.25 %; 40 mg methylPREDNISolone acetate 40 MG/ML Medications #2:  1 mL lidocaine 1 %; 1 mL bupivacaine 0.25 % Medications #3:  1 mL lidocaine 1 %; 1 mL bupivacaine 0.25 % Additional Injections?: No   Patient tolerance:  Patient tolerated the procedure well with no immediate complications Comments:  Procedure: Trigger point injections (3), right scapular border and thoracic/lumbar paraspinal musculature After discussion on R/B/I and informed verbal consent was obtained, a timeout was conducted. The patient was placed in a prone position on the examination table and the area of maximal tenderness was identified over the right inferior scapular border as well as the lower thoracic and upper lumbar paraspinal musculature.  This area was cleansed with Betadine and multiple alcohol swabs. Ethyl chloride was used for local anesthesia. Using a 25-gauge 1.5 inch needle the trigger point(s) was subsequently injected with a mixture 1 cc of methylprednisolone 40 mg/mL and 2 cc of 1% lidocaine without epinephrine and 2 cc of bupivicaine 0.25%, with a total of 2 cc of injectate into each trigger point. A band-aid was applied following. Patient tolerated procedure well, there were no post-injection complications. Post-procedure instructions were given.         Clinical History: No specialty comments available.  She reports that she has never smoked. She has been exposed to tobacco smoke. She has never used smokeless tobacco. No results for input(s): "HGBA1C", "LABURIC" in the last 8760 hours.  Objective:   Physical Exam  Gen: Well-appearing, in no acute distress; non-toxic CV:  Well-perfused. Warm.  Resp: Breathing unlabored on room air; no wheezing. Psych: Fluid speech in conversation; appropriate affect; normal thought process Neuro: Sensation intact throughout. No gross coordination deficits.   Ortho Exam - Scapular/Low back: There is no scapular winging, although there is a muscular hypertonicity of the right scapula with some dyspnea for taking her range of motion.  There is exaggerated thoracic kyphosis.  Associated trigger points of the inferior and medial aspect of the scapula as well as the right sided thoracic and lumbar paraspinal musculature.  Imaging: No results found.  Past  Medical/Family/Surgical/Social History: Medications & Allergies reviewed per EMR, new medications updated. 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   Muscle weakness (generalized) 09/17/2013   Scoliosis concern 09/17/2013   Stiffness of joint, not elsewhere classified, pelvic region and thigh 09/17/2013   Difficulty walking 09/17/2013   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   Osteopenia    Raynaud's disease    Raynaud'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 01/27/2016   Rosacea 01/27/2016   Varicose veins    Vitamin D deficiency 01/27/2016   Family History  Problem Relation Age of Onset  Cancer Mother    Cancer Father    Heart attack Father    Breast cancer Sister    Cancer Brother    Past Surgical History:  Procedure Laterality Date   ABDOMINAL HYSTERECTOMY     CESAREAN SECTION     CHOLECYSTECTOMY     COLONOSCOPY  2023   COLONOSCOPY WITH PROPOFOL N/A 09/29/2021   Procedure: COLONOSCOPY WITH PROPOFOL;  Surgeon: Jeani Hawking, MD;  Location: [facility];  Service: Gastroenterology;  Laterality: N/A;   ENDOVENOUS ABLATION SAPHENOUS VEIN W/ LASER Right 02/07/2022   endovenous laser ablation right greater saphenous vein and stab phlebectomy 10-20 incisions right leg by Cari Caraway MD   HEMOSTASIS CLIP PLACEMENT  09/29/2021   Procedure: HEMOSTASIS CLIP PLACEMENT;  Surgeon: Jeani Hawking, MD;  Location: [facility];  Service: Gastroenterology;;   INTESTINAL BLOCKAGE      POLYPECTOMY  09/29/2021   Procedure: POLYPECTOMY;  Surgeon: Jeani Hawking, MD;  Location: [facility];  Service: Gastroenterology;;   TONSILLECTOMY     Social History   Occupational History   Not on file  Tobacco Use   Smoking status: Never    Passive exposure: Past   Smokeless tobacco: Never  Vaping Use   Vaping status: Never Used  Substance and Sexual Activity   Alcohol use: Yes    Alcohol/week: 4.0 standard drinks of alcohol    Types: 4 Glasses of wine per week    Comment: occ   Drug use: No   Sexual activity: Not on file

## 2022-12-20 ENCOUNTER — Ambulatory Visit: Payer: Medicare HMO | Admitting: Rehabilitative and Restorative Service Providers"

## 2023-01-01 ENCOUNTER — Other Ambulatory Visit: Payer: Self-pay | Admitting: *Deleted

## 2023-01-01 DIAGNOSIS — M810 Age-related osteoporosis without current pathological fracture: Secondary | ICD-10-CM

## 2023-01-01 MED ORDER — TERIPARATIDE 600 MCG/2.4ML ~~LOC~~ SOPN: 20.0000 ug | PEN_INJECTOR | Freq: Every day | SUBCUTANEOUS | 0 refills | Status: DC

## 2023-01-01 NOTE — Telephone Encounter (Signed)
Received call from Pharmacy [facility] called the office and requested a refill on Forteo.   Last Fill: 11/02/2022  Labs: 11/09/2022 Creatinine remains mildly elevated. Calcium remains mildly elevated, vitamin D is low at 28.  Should avoid calcium supplement and take vitamin D 1000 units daily.   Next Visit: 05/14/2023  Last Visit: 11/09/2022  DX: Age-related osteoporosis without current pathological fracture   Current Dose per office note 11/09/2022: forteo started 11/16/2021.   Okay to refill Forteo?

## 2023-01-04 ENCOUNTER — Ambulatory Visit (HOSPITAL_COMMUNITY): Payer: Medicare HMO | Attending: Sports Medicine

## 2023-01-04 ENCOUNTER — Other Ambulatory Visit: Payer: Self-pay

## 2023-01-04 DIAGNOSIS — M545 Low back pain, unspecified: Secondary | ICD-10-CM | POA: Diagnosis present

## 2023-01-04 DIAGNOSIS — M4004 Postural kyphosis, thoracic region: Secondary | ICD-10-CM | POA: Diagnosis not present

## 2023-01-04 DIAGNOSIS — M25511 Pain in right shoulder: Secondary | ICD-10-CM | POA: Insufficient documentation

## 2023-01-04 DIAGNOSIS — M7918 Myalgia, other site: Secondary | ICD-10-CM | POA: Insufficient documentation

## 2023-01-04 DIAGNOSIS — R293 Abnormal posture: Secondary | ICD-10-CM

## 2023-01-04 DIAGNOSIS — M5134 Other intervertebral disc degeneration, thoracic region: Secondary | ICD-10-CM | POA: Insufficient documentation

## 2023-01-04 DIAGNOSIS — M549 Dorsalgia, unspecified: Secondary | ICD-10-CM | POA: Insufficient documentation

## 2023-01-04 DIAGNOSIS — M546 Pain in thoracic spine: Secondary | ICD-10-CM

## 2023-01-04 DIAGNOSIS — M62838 Other muscle spasm: Secondary | ICD-10-CM | POA: Diagnosis not present

## 2023-01-04 NOTE — Therapy (Signed)
OUTPATIENT PHYSICAL THERAPY THORACOLUMBAR EVALUATION   Patient Name: Lauren Knox MRN: [id number] DOB:May 17, 1945, 77 y.o., female Today's Date: 01/04/2023  END OF SESSION:  PT End of Session - 01/04/23 1314     Visit Number 1    Number of Visits 4    Date for PT Re-Evaluation 02/01/23    Authorization Type [employer] Medicare HMO/PPO    PT Start Time 1025    PT Stop Time 1100    PT Time Calculation (min) 35 min    Activity Tolerance Patient tolerated treatment well    Behavior During Therapy [facility] for tasks assessed/performed            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   Osteopenia    Raynaud's disease    Raynaud'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 01/27/2016   Rosacea 01/27/2016   Varicose veins    Vitamin D deficiency 01/27/2016   Past Surgical History:  Procedure Laterality Date   ABDOMINAL HYSTERECTOMY     CESAREAN SECTION     CHOLECYSTECTOMY     COLONOSCOPY  2023   COLONOSCOPY WITH PROPOFOL N/A 09/29/2021   Procedure: COLONOSCOPY WITH PROPOFOL;  Surgeon: Jeani Hawking, MD;  Location: [facility];  Service: Gastroenterology;  Laterality: N/A;   ENDOVENOUS ABLATION SAPHENOUS VEIN W/ LASER Right 02/07/2022   endovenous laser ablation right greater saphenous vein and stab phlebectomy 10-20 incisions right leg by Cari Caraway MD   HEMOSTASIS CLIP PLACEMENT  09/29/2021   Procedure: HEMOSTASIS CLIP PLACEMENT;  Surgeon: Jeani Hawking, MD;  Location: [facility];  Service: Gastroenterology;;   INTESTINAL BLOCKAGE     POLYPECTOMY  09/29/2021   Procedure: POLYPECTOMY;  Surgeon: Jeani Hawking, MD;  Location: [facility];  Service: Gastroenterology;;   TONSILLECTOMY     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   Osteoarthritis of hands, bilateral  01/27/2016   DDD (degenerative disc disease), 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   Raynaud'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 01/27/2016   ADD (attention deficit disorder) 01/27/2016   Rosacea 01/27/2016   Vitamin D deficiency 01/27/2016   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   Muscle weakness (generalized) 09/17/2013   Scoliosis concern 09/17/2013   Stiffness of joint, not elsewhere classified, pelvic region and thigh 09/17/2013   Difficulty walking 09/17/2013    PCP: Carylon Perches, MD  REFERRING PROVIDER: Madelyn Brunner, DO  REFERRING DIAG: [phone] (ICD-10-CM) - Muscle spasm M54.9 (ICD-10-CM) - Trigger point with back pain M25.511 (ICD-10-CM) - Trigger point of right shoulder region M51.34 (ICD-10-CM) - DDD (degenerative disc disease), thoracic M79.18 (ICD-10-CM) - Muscle pain, lumbar R29.3 (ICD-10-CM) - Abnormal posture  Rationale for Evaluation and Treatment: Rehabilitation  THERAPY DIAG:  Pain in thoracic spine  Abnormal posture  Low back pain, unspecified back pain laterality, unspecified chronicity, unspecified whether sciatica present  ONSET DATE: 3 years ago  SUBJECTIVE:  SUBJECTIVE STATEMENT: Arrives to the clinic with c/o pain on the back (see below). Condition has been intermittent for around 3 years ago without apparent reason. Can't remember if she had imaging for the area. Patient had injections and steroids in the past which helped. Recent MD consultation referred patient to outpatient PT evaluation and management.  PERTINENT HISTORY:  Raynaud's phenomenon, osteoporosis  PAIN:  Are you having pain? Yes: NPRS scale: 8 Pain location: just under the R shoulder blade down to the low back Pain description: aching/burning Aggravating factors: laying on R side, bending over Relieving factors: heating  pad  PRECAUTIONS: Other: Raynaud's phenomenon, osteoporosis  RED FLAGS: None   WEIGHT BEARING RESTRICTIONS: No  FALLS:  Has patient fallen in last 6 months? No  LIVING ENVIRONMENT: Lives with: lives with their spouse Lives in: House/apartment Stairs: Yes: External: 2 steps; bilateral railing Has following equipment at home: None  OCCUPATION: retired  PLOF: Independent and Independent with basic ADLs  PATIENT GOALS: "some exercises to take so I won't have pain"  NEXT MD VISIT: After PT is over  OBJECTIVE:  Note: Objective measures were completed at Evaluation unless otherwise noted.  DIAGNOSTIC FINDINGS:  05/24/22 Lumbar x-ray 2 views of the lumbar spine including AP and lateral femoral ordered and  reviewed by myself.  X-rays demonstrate mild to moderate lumbar DDD from  L3 - S1.  There is at least moderate facet arthropathy around the L4 and  L5 region.  No acute bony fracture or compression of intervertebral discs.  Mild lumbar scoliosis  PATIENT SURVEYS:  FOTO 55.51  COGNITION: Overall cognitive status: Within functional limits for tasks assessed     SENSATION: Not tested  MUSCLE LENGTH: Hamstrings, Piriformis: WFL on B Pectoralis: moderate restriction  POSTURE: rounded shoulders and forward head  PALPATION: No tenderness or muscle spasm on general muscle spasm on the thoracic/lumbar area.  LUMBAR ROM:   AROM eval  Flexion WFL  Extension WFL  Right lateral flexion   Left lateral flexion   Right rotation WFL  Left rotation WFL   (Blank rows = not tested)  LOWER EXTREMITY ROM:     Active  Right eval Left eval  Hip flexion [facility]  Hip extension [facility]  Hip abduction [facility]  Hip adduction    Hip internal rotation    Hip external rotation    Knee flexion [facility] WFL  Knee extension [facility]  Ankle dorsiflexion [facility] WFL  Ankle plantarflexion [facility] WFL  Ankle inversion    Ankle eversion     (Blank rows = not tested)  LOWER EXTREMITY MMT:     MMT Right eval Left eval  Hip flexion 4 4  Hip extension 4 4  Hip abduction 4 4  Hip adduction    Hip internal rotation    Hip external rotation    Knee flexion 4+ 4+  Knee extension 4+ 4+  Ankle dorsiflexion 4+ 4+  Ankle plantarflexion 4+ 4+  Ankle inversion    Ankle eversion     (Blank rows = not tested)  LUMBAR SPECIAL TESTS:  Straight leg raise test: Negative  FUNCTIONAL TESTS:  5 times sit to stand: 5.96 sec 2 minute walk test: 518 ft  GAIT: Distance walked: 518 ft Assistive device utilized: None Level of assistance: Complete Independence Comments: done during , no gait deviations noted  TREATMENT DATE:  01/04/23 Evaluation and patient education done  PATIENT EDUCATION:  Education details: Educated on the pathoanatomy of thoracic and low back pain. Educated on the goals and course of rehab.  Person educated: Patient Education method: Explanation Education comprehension: verbalized understanding  HOME EXERCISE PROGRAM: None provided to date  ASSESSMENT:  CLINICAL IMPRESSION: Patient is a 77 y.o. female who was seen today for physical therapy evaluation and treatment for muscle spasm and trigger point for the low back. Patient's condition is further defined by difficulty bending over due to pain, weakness, and decreased soft tissue extensibility. Skilled PT is required to address the impairments and functional limitations listed below.   OBJECTIVE IMPAIRMENTS: decreased strength, impaired flexibility, postural dysfunction, and pain.   ACTIVITY LIMITATIONS: carrying, lifting, and bending  PARTICIPATION LIMITATIONS: meal prep, cleaning, laundry, driving, shopping, community activity, and yard work  PERSONAL FACTORS: Age are also affecting patient's functional outcome.   REHAB POTENTIAL: Good  CLINICAL DECISION MAKING:  Stable/uncomplicated  EVALUATION COMPLEXITY: Low   GOALS: Goals reviewed with patient? Yes  SHORT TERM GOALS: Target date: 01/17/22  Pt will demonstrate indep in HEP to facilitate carry-over of skilled services and improve functional outcomes  LONG TERM GOALS: Target date: 02/01/23  Pt will increase FOTO to at least 63 in order to demonstrate significant improvement in function related to ADLs Baseline: 55.51 Goal status: INITIAL  2.  Pt will demonstrate increase in LE strength to 4+/5 to facilitate ease and safety in ambulation Baseline: 4/5 Goal status: INITIAL  PLAN:  PT FREQUENCY: 1x/week  PT DURATION: 4 weeks  PLANNED INTERVENTIONS: 97164- PT Re-evaluation, 97110-Therapeutic exercises, 97530- Therapeutic activity, 97112- Neuromuscular re-education, 97535- Self Care, 43329- Manual therapy, 97014- Electrical stimulation (unattended), Patient/Family education, and Moist heat.  PLAN FOR NEXT SESSION: Provide HEP. Begin core strengthening and postural kinesthetic activities.   Tish Frederickson. Rayyan Orsborn, PT, DPT, OCS Board-Certified Clinical Specialist in Orthopedic PT PT Compact Privilege # ([place]): [phone] T 01/04/2023, 1:17 PM

## 2023-01-14 ENCOUNTER — Ambulatory Visit (HOSPITAL_COMMUNITY): Payer: Medicare HMO

## 2023-01-14 DIAGNOSIS — R293 Abnormal posture: Secondary | ICD-10-CM

## 2023-01-14 DIAGNOSIS — M546 Pain in thoracic spine: Secondary | ICD-10-CM | POA: Diagnosis not present

## 2023-01-14 DIAGNOSIS — M545 Low back pain, unspecified: Secondary | ICD-10-CM

## 2023-01-14 NOTE — Therapy (Signed)
OUTPATIENT PHYSICAL THERAPY THORACOLUMBAR TREATMENT   Patient Name: Lauren Knox MRN: [id number] DOB:June 19, 1945, 77 y.o., female Today's Date: 01/14/2023  END OF SESSION:  PT End of Session - 01/14/23 0900     Visit Number 2    Number of Visits 4    Date for PT Re-Evaluation 02/01/23    Authorization Type [employer] Medicare HMO/PPO    PT Start Time [phone]    PT Stop Time 0930    PT Time Calculation (min) 40 min    Activity Tolerance Patient tolerated treatment well    Behavior During Therapy [facility] for tasks assessed/performed             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   Osteopenia    Raynaud's disease    Raynaud'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 01/27/2016   Rosacea 01/27/2016   Varicose veins    Vitamin D deficiency 01/27/2016   Past Surgical History:  Procedure Laterality Date   ABDOMINAL HYSTERECTOMY     CESAREAN SECTION     CHOLECYSTECTOMY     COLONOSCOPY  2023   COLONOSCOPY WITH PROPOFOL N/A 09/29/2021   Procedure: COLONOSCOPY WITH PROPOFOL;  Surgeon: Jeani Hawking, MD;  Location: [facility];  Service: Gastroenterology;  Laterality: N/A;   ENDOVENOUS ABLATION SAPHENOUS VEIN W/ LASER Right 02/07/2022   endovenous laser ablation right greater saphenous vein and stab phlebectomy 10-20 incisions right leg by Cari Caraway MD   HEMOSTASIS CLIP PLACEMENT  09/29/2021   Procedure: HEMOSTASIS CLIP PLACEMENT;  Surgeon: Jeani Hawking, MD;  Location: [facility];  Service: Gastroenterology;;   INTESTINAL BLOCKAGE     POLYPECTOMY  09/29/2021   Procedure: POLYPECTOMY;  Surgeon: Jeani Hawking, MD;  Location: [facility];  Service: Gastroenterology;;   TONSILLECTOMY     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   Osteoarthritis of hands, bilateral  01/27/2016   DDD (degenerative disc disease), 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   Raynaud'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 01/27/2016   ADD (attention deficit disorder) 01/27/2016   Rosacea 01/27/2016   Vitamin D deficiency 01/27/2016   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   Muscle weakness (generalized) 09/17/2013   Scoliosis concern 09/17/2013   Stiffness of joint, not elsewhere classified, pelvic region and thigh 09/17/2013   Difficulty walking 09/17/2013    PCP: Carylon Perches, MD  REFERRING PROVIDER: Madelyn Brunner, DO  REFERRING DIAG: [phone] (ICD-10-CM) - Muscle spasm M54.9 (ICD-10-CM) - Trigger point with back pain M25.511 (ICD-10-CM) - Trigger point of right shoulder region M51.34 (ICD-10-CM) - DDD (degenerative disc disease), thoracic M79.18 (ICD-10-CM) - Muscle pain, lumbar R29.3 (ICD-10-CM) - Abnormal posture  Rationale for Evaluation and Treatment: Rehabilitation  THERAPY DIAG:  Pain in thoracic spine  Abnormal posture  Low back pain, unspecified back pain laterality, unspecified chronicity, unspecified whether sciatica present  ONSET DATE: 3 years ago  SUBJECTIVE:  SUBJECTIVE STATEMENT: Denies any pain today. However, patient reports of having some R back pain just under the R shoulder blade a few days ago.   EVAL: Arrives to the clinic with c/o pain on the back (see below). Condition has been intermittent for around 3 years ago without apparent reason. Can't remember if she had imaging for the area. Patient had injections and steroids in the past which helped. Recent MD consultation referred patient to outpatient PT evaluation and management.  PERTINENT HISTORY:  Raynaud's phenomenon, osteoporosis  PAIN:  Are you having pain? Yes: NPRS scale: 8 Pain location: just under the R shoulder blade down to the low  back Pain description: aching/burning Aggravating factors: laying on R side, bending over Relieving factors: heating pad  PRECAUTIONS: Other: Raynaud's phenomenon, osteoporosis  RED FLAGS: None   WEIGHT BEARING RESTRICTIONS: No  FALLS:  Has patient fallen in last 6 months? No  LIVING ENVIRONMENT: Lives with: lives with their spouse Lives in: House/apartment Stairs: Yes: External: 2 steps; bilateral railing Has following equipment at home: None  OCCUPATION: retired  PLOF: Independent and Independent with basic ADLs  PATIENT GOALS: "some exercises to take so I won't have pain"  NEXT MD VISIT: After PT is over  OBJECTIVE:  Note: Objective measures were completed at Evaluation unless otherwise noted.  DIAGNOSTIC FINDINGS:  05/24/22 Lumbar x-ray 2 views of the lumbar spine including AP and lateral femoral ordered and  reviewed by myself.  X-rays demonstrate mild to moderate lumbar DDD from  L3 - S1.  There is at least moderate facet arthropathy around the L4 and  L5 region.  No acute bony fracture or compression of intervertebral discs.  Mild lumbar scoliosis  PATIENT SURVEYS:  FOTO 55.51  COGNITION: Overall cognitive status: Within functional limits for tasks assessed     SENSATION: Not tested  MUSCLE LENGTH: Hamstrings, Piriformis: WFL on B Pectoralis: moderate restriction  POSTURE: rounded shoulders and forward head  PALPATION: No tenderness or muscle spasm on general muscle spasm on the thoracic/lumbar area.  LUMBAR ROM:   AROM eval  Flexion WFL  Extension WFL  Right lateral flexion   Left lateral flexion   Right rotation WFL  Left rotation WFL   (Blank rows = not tested)  LOWER EXTREMITY ROM:     Active  Right eval Left eval  Hip flexion [facility]  Hip extension [facility]  Hip abduction [facility]  Hip adduction    Hip internal rotation    Hip external rotation    Knee flexion [facility] WFL  Knee extension [facility]  Ankle dorsiflexion [facility] WFL  Ankle  plantarflexion [facility] WFL  Ankle inversion    Ankle eversion     (Blank rows = not tested)  LOWER EXTREMITY MMT:    MMT Right eval Left eval  Hip flexion 4 4  Hip extension 4 4  Hip abduction 4 4  Hip adduction    Hip internal rotation    Hip external rotation    Knee flexion 4+ 4+  Knee extension 4+ 4+  Ankle dorsiflexion 4+ 4+  Ankle plantarflexion 4+ 4+  Ankle inversion    Ankle eversion     (Blank rows = not tested)  LUMBAR SPECIAL TESTS:  Straight leg raise test: Negative  FUNCTIONAL TESTS:  5 times sit to stand: 5.96 sec 2 minute walk test: 518 ft  GAIT: Distance walked: 518 ft Assistive device utilized: None Level of assistance: Complete Independence Comments: done during , no gait deviations  noted  TREATMENT DATE:  01/14/23 Review of goals UBE, forward, level 1, 5' Doorway pec stretch, 60 abd, 30" x 3 Abdominal press, neutral spine x 3" x 10 x 2 Scapular retraction with ER, YTB x 3" x 10 x 2 LTR x 1' Sidelying open book with cervical tracking x 10 on each  01/04/23 Evaluation and patient education done                                                                                                                               PATIENT EDUCATION:  Education details: Educated on the pathoanatomy of thoracic and low back pain. Educated on the goals and course of rehab.  Person educated: Patient Education method: Explanation Education comprehension: verbalized understanding  HOME EXERCISE PROGRAM: Access Code: [email or web address] URL: [email or web address] Date: 01/14/2023 Prepared by: Krystal Clark  Exercises - Doorway Pec Stretch at 60 Degrees Abduction with Arm Straight  - 1 x daily - 5 x weekly - 3 reps - 30 hold - Seated Abdominal Press into [employer]  - 1 x daily - 5 x weekly - 2 sets - 10 reps - 3 hold - Shoulder External Rotation and Scapular Retraction with Resistance  - 1 x daily - 7 x weekly - 2 sets - 10 reps - 3 hold - Supine  Lower Trunk Rotation  - 1 x daily - 7 x weekly - Sidelying Thoracic Rotation with Open Book  - 1 x daily - 5 x weekly - 2 sets - 10 reps - Supine Bridge  - 1 x daily - 5 x weekly - 10 reps - 3 hold  ASSESSMENT:  CLINICAL IMPRESSION: Interventions today were geared towards core strengthening and postural retraining. Tolerated all activities without worsening of symptoms. Demonstrated appropriate levels of fatigue. Provided slight amount of cueing to ensure correct execution of activity with good carry-over. To date, skilled PT is required to address the impairments and improve function.  EVAL: Patient is a 77 y.o. female who was seen today for physical therapy evaluation and treatment for muscle spasm and trigger point for the low back. Patient's condition is further defined by difficulty bending over due to pain, weakness, and decreased soft tissue extensibility. Skilled PT is required to address the impairments and functional limitations listed below.   OBJECTIVE IMPAIRMENTS: decreased strength, impaired flexibility, postural dysfunction, and pain.   ACTIVITY LIMITATIONS: carrying, lifting, and bending  PARTICIPATION LIMITATIONS: meal prep, cleaning, laundry, driving, shopping, community activity, and yard work  PERSONAL FACTORS: Age are also affecting patient's functional outcome.   REHAB POTENTIAL: Good  CLINICAL DECISION MAKING: Stable/uncomplicated  EVALUATION COMPLEXITY: Low   GOALS: Goals reviewed with patient? Yes  SHORT TERM GOALS: Target date: 01/17/22  Pt will demonstrate indep in HEP to facilitate carry-over of skilled services and improve functional outcomes  LONG TERM GOALS: Target date: 02/01/23  Pt will increase FOTO to at least 63 in order to demonstrate significant improvement in function  related to ADLs Baseline: 55.51 Goal status: INITIAL  2.  Pt will demonstrate increase in LE strength to 4+/5 to facilitate ease and safety in ambulation Baseline: 4/5 Goal  status: INITIAL  PLAN:  PT FREQUENCY: 1x/week  PT DURATION: 4 weeks  PLANNED INTERVENTIONS: 97164- PT Re-evaluation, 97110-Therapeutic exercises, 97530- Therapeutic activity, 97112- Neuromuscular re-education, 97535- Self Care, 16109- Manual therapy, 97014- Electrical stimulation (unattended), Patient/Family education, and Moist heat.  PLAN FOR NEXT SESSION: Continue POC and may progress as tolerated with emphasis on core strengthening and postural kinesthetic activities.   Tish Frederickson. Slevin Gunby, PT, DPT, OCS Board-Certified Clinical Specialist in Orthopedic PT PT Compact Privilege # ([place]): [id number] T 01/14/2023, 9:15 AM

## 2023-01-17 ENCOUNTER — Telehealth: Payer: Self-pay | Admitting: *Deleted

## 2023-01-17 NOTE — Telephone Encounter (Signed)
 Patient contacted the office and states she is on [employer] . Patient states she contact [employer] to get a refill on the Fort[employer] . Patient states she was advised she needs a renewal. Patient states she is unaware if they meant an application renewal for patient assistance or prescription. Prescription was sent to the pharmacy on 01/01/2023. Please reach out to patient and advise.

## 2023-01-21 ENCOUNTER — Telehealth: Payer: Self-pay | Admitting: Pharmacist

## 2023-01-21 ENCOUNTER — Telehealth: Payer: Self-pay | Admitting: *Deleted

## 2023-01-21 NOTE — Telephone Encounter (Signed)
 Patient returned call to the office. Patient states she is confused about the call she received regarding her Fort[employer]. Patient would like a call back for clarification.

## 2023-01-21 NOTE — Telephone Encounter (Signed)
 Patient will need to reapply for [employer] PAP. ATC patient but phone went straight to VM. Advised that we will mail application to address on file which she needs to complete and return to clinic. Provider portion placed in Dr. Jammie folder for signature  Sherry Pennant, PharmD, MPH, BCPS, CPP Clinical Pharmacist (Rheumatology and Pulmonology)

## 2023-01-21 NOTE — Telephone Encounter (Signed)
 Phone went striaght to VM again - left same VM as before

## 2023-01-22 ENCOUNTER — Encounter (HOSPITAL_COMMUNITY): Payer: Medicare HMO

## 2023-01-24 NOTE — Telephone Encounter (Signed)
 Received signed provider form from Dr. Corliss Skains. Scanned to [employer] with med list, insurance card copy, and PA approval. Submission pending patient portion  Chesley Mires, PharmD, MPH, BCPS, CPP Clinical Pharmacist (Rheumatology and Pulmonology)

## 2023-01-28 ENCOUNTER — Ambulatory Visit: Payer: Medicare HMO | Admitting: Sports Medicine

## 2023-01-29 ENCOUNTER — Ambulatory Visit (HOSPITAL_COMMUNITY): Payer: Medicare HMO | Attending: Sports Medicine

## 2023-01-29 DIAGNOSIS — M545 Low back pain, unspecified: Secondary | ICD-10-CM | POA: Insufficient documentation

## 2023-01-29 DIAGNOSIS — R293 Abnormal posture: Secondary | ICD-10-CM | POA: Diagnosis present

## 2023-01-29 DIAGNOSIS — M546 Pain in thoracic spine: Secondary | ICD-10-CM | POA: Insufficient documentation

## 2023-01-29 NOTE — Therapy (Signed)
 OUTPATIENT PHYSICAL THERAPY THORACOLUMBAR TREATMENT/PROGRESS NOTE/DISCHARGE NOTE Progress Note Reporting Period 01/04/2023 to 01/29/2023  See note below for Objective Data and Assessment of Progress/Goals.   PHYSICAL THERAPY DISCHARGE SUMMARY  Visits from Start of Care: 3  Current functional level related to goals / functional outcomes: See below   Remaining deficits: See below   Education / Equipment: See below   Patient agrees to discharge. Patient goals were met. Patient is being discharged due to meeting the stated rehab goals.       Patient Name: Lauren Knox MRN: [id number] DOB:1945/11/14, 78 y.o., female, female Today's Date: 01/29/2023  END OF SESSION:  PT End of Session - 01/29/23 0939     Visit Number 3    Number of Visits 4    Date for PT Re-Evaluation 02/01/23    Authorization Type [employer] HMO/PPO    PT Start Time [phone]   late check in   PT Stop Time 1005    PT Time Calculation (min) 27 min    Activity Tolerance Patient tolerated treatment well    Behavior During Therapy [facility] for tasks assessed/performed             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   Osteopenia    Raynaud's disease    Raynaud'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 01/27/2016   Rosacea 01/27/2016   Varicose veins    Vitamin D  deficiency 01/27/2016   Past Surgical History:  Procedure Laterality Date   ABDOMINAL HYSTERECTOMY     CESAREAN SECTION     CHOLECYSTECTOMY     COLONOSCOPY  2023   COLONOSCOPY WITH PROPOFOL  N/A 09/29/2021   Procedure: COLONOSCOPY WITH PROPOFOL ;  Surgeon: Rollin Dover, MD;  Location: [facility];  Service: Gastroenterology;  Laterality: N/A;   ENDOVENOUS ABLATION SAPHENOUS VEIN W/ LASER Right 02/07/2022   endovenous laser ablation right greater saphenous vein and  stab phlebectomy 10-20 incisions right leg by Medford Blade MD   HEMOSTASIS CLIP PLACEMENT  09/29/2021   Procedure: HEMOSTASIS CLIP PLACEMENT;  Surgeon: Rollin Dover, MD;  Location: [facility];  Service: Gastroenterology;;   INTESTINAL BLOCKAGE     POLYPECTOMY  09/29/2021   Procedure: POLYPECTOMY;  Surgeon: Rollin Dover, MD;  Location: [facility];  Service: Gastroenterology;;   TONSILLECTOMY     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   Muscle weakness (generalized) 09/17/2013   Scoliosis concern 09/17/2013   Stiffness of joint, not elsewhere classified, pelvic region and thigh 09/17/2013   Difficulty walking 09/17/2013    PCP: Sheryle Carwin, MD  REFERRING PROVIDER: Burnetta Brunet, DO  REFERRING DIAG: [phone] (ICD-10-CM) - Muscle spasm M54.9 (ICD-10-CM) - Trigger point with back pain M25.511 (ICD-10-CM) - Trigger point of right shoulder region M51.34 (ICD-10-CM) - DDD (degenerative disc disease), thoracic M79.18 (ICD-10-CM) - Muscle pain, lumbar R29.3 (ICD-10-CM) - Abnormal posture  Rationale for Evaluation and Treatment: Rehabilitation  THERAPY DIAG:  Pain in thoracic spine  Abnormal posture  Low back pain, unspecified back pain laterality, unspecified chronicity, unspecified whether sciatica present  ONSET DATE: 3  years ago  SUBJECTIVE:                                                                                                                                                                                           SUBJECTIVE STATEMENT: Back is overall much better.  Feels she is doing well and no longer needs further therapy  EVAL: Arrives to the  clinic with c/o pain on the back (see below). Condition has been intermittent for around 3 years ago without apparent reason. Can't remember if she had imaging for the area. Patient had injections and steroids in the past which helped. Recent MD consultation referred patient to outpatient PT evaluation and management.  PERTINENT HISTORY:  Raynaud's phenomenon, osteoporosis  PAIN:  Are you having pain? Yes: NPRS scale: 8 Pain location: just under the R shoulder blade down to the low back Pain description: aching/burning Aggravating factors: laying on R side, bending over Relieving factors: heating pad  PRECAUTIONS: Other: Raynaud's phenomenon, osteoporosis  RED FLAGS: None   WEIGHT BEARING RESTRICTIONS: No  FALLS:  Has patient fallen in last 6 months? No  LIVING ENVIRONMENT: Lives with: lives with their spouse Lives in: House/apartment Stairs: Yes: External: 2 steps; bilateral railing Has following equipment at home: None  OCCUPATION: retired  PLOF: Independent and Independent with basic ADLs  PATIENT GOALS: some exercises to take so I won't have pain  NEXT MD VISIT: After PT is over  OBJECTIVE:  Note: Objective measures were completed at Evaluation unless otherwise noted.  DIAGNOSTIC FINDINGS:  05/24/22 Lumbar x-ray 2 views of the lumbar spine including AP and lateral femoral ordered and  reviewed by myself.  X-rays demonstrate mild to moderate lumbar DDD from  L3 - S1.  There is at least moderate facet arthropathy around the L4 and  L5 region.  No acute bony fracture or compression of intervertebral discs.  Mild lumbar scoliosis  PATIENT SURVEYS:  FOTO 55.51  COGNITION: Overall cognitive status: Within functional limits for tasks assessed     SENSATION: Not tested  MUSCLE LENGTH: Hamstrings, Piriformis: WFL on B Pectoralis: moderate restriction  POSTURE: rounded shoulders and forward head  PALPATION: No tenderness or muscle spasm on general muscle spasm  on the thoracic/lumbar area.  LUMBAR ROM:   AROM eval  Flexion WFL  Extension WFL  Right lateral flexion   Left lateral flexion   Right rotation WFL  Left rotation WFL   (Blank rows = not tested)  LOWER EXTREMITY ROM:     Active  Right eval Left eval  Hip flexion [facility]  Hip extension [facility]  Hip abduction [facility]  Hip adduction    Hip internal rotation    Hip external rotation    Knee flexion [facility] [address] [facility]  Knee extension [facility]  Ankle dorsiflexion [facility]  Ankle plantarflexion [facility]  Ankle inversion    Ankle eversion     (Blank rows = not tested)  LOWER EXTREMITY MMT:    MMT Right eval Left eval Right 01/29/23 Left 01/29/23  Hip flexion 4 4 5 5   Hip extension 4 4    Hip abduction 4 4    Hip adduction      Hip internal rotation      Hip external rotation      Knee flexion 4+ 4+    Knee extension 4+ 4+ 5 5  Ankle dorsiflexion 4+ 4+ 5 5  Ankle plantarflexion 4+ 4+    Ankle inversion      Ankle eversion       (Blank rows = not tested)  LUMBAR SPECIAL TESTS:  Straight leg raise test: Negative  FUNCTIONAL TESTS:  5 times sit to stand: 5.96 sec 2 minute walk test: 518 ft  GAIT: Distance walked: 518 ft Assistive device utilized: None Level of assistance: Complete Independence Comments: done during , no gait deviations noted  TREATMENT DATE:  01/28/22 Progress note 5 times sit to stand 5.90 2 MWT 523 ft FOTO 75 MMT' s see above Review of HEP   01/14/23 Review of goals UBE, forward, level 1, 5' Doorway pec stretch, 60 abd, 30 x 3 Abdominal press, neutral spine x 3 x 10 x 2 Scapular retraction with ER, YTB x 3 x 10 x 2 LTR x 1' Sidelying open book with cervical tracking x 10 on each  01/04/23 Evaluation and patient education done                                                                                                                               PATIENT EDUCATION:  Education details: Educated on the pathoanatomy of thoracic  and low back pain. Educated on the goals and course of rehab.  Person educated: Patient Education method: Explanation Education comprehension: verbalized understanding  HOME EXERCISE PROGRAM: Access Code: [email or web address] URL: [email or web address] Date: 01/14/2023 Prepared by: Vinie Haver  Exercises - Doorway Pec Stretch at 60 Degrees Abduction with Arm Straight  - 1 x daily - 5 x weekly - 3 reps - 30 hold - Seated Abdominal Press into [employer]  - 1 x daily - 5 x weekly - 2 sets - 10 reps - 3 hold - Shoulder External Rotation and Scapular Retraction with Resistance  - 1 x daily - 7 x weekly - 2 sets - 10 reps - 3 hold - Supine Lower Trunk Rotation  - 1 x daily - 7 x weekly - Sidelying Thoracic Rotation with Open Book  - 1 x daily - 5 x weekly - 2 sets - 10 reps - Supine Bridge  - 1 x daily - 5  x weekly - 10 reps - 3 hold  ASSESSMENT:  CLINICAL IMPRESSION: Progress note as patient feels she is doing well. Review of HEP; patient has met all set rehab goals and is agreeable to discharge at this time.     EVAL: Patient is a 78 y.o. female who was seen today for physical therapy evaluation and treatment for muscle spasm and trigger point for the low back. Patient's condition is further defined by difficulty bending over due to pain, weakness, and decreased soft tissue extensibility. Skilled PT is required to address the impairments and functional limitations listed below.   OBJECTIVE IMPAIRMENTS: decreased strength, impaired flexibility, postural dysfunction, and pain.   ACTIVITY LIMITATIONS: carrying, lifting, and bending  PARTICIPATION LIMITATIONS: meal prep, cleaning, laundry, driving, shopping, community activity, and yard work  PERSONAL FACTORS: Age are also affecting patient's functional outcome.   REHAB POTENTIAL: Good  CLINICAL DECISION MAKING: Stable/uncomplicated  EVALUATION COMPLEXITY: Low   GOALS: Goals reviewed with patient? Yes  SHORT TERM GOALS:  Target date: 01/17/22  Pt will demonstrate indep in HEP to facilitate carry-over of skilled services and improve functional outcomes  LONG TERM GOALS: Target date: 02/01/23  Pt will increase FOTO to at least 63 in order to demonstrate significant improvement in function related to ADLs Baseline: 55.51 Goal status: met  2.  Pt will demonstrate increase in LE strength to 4+/5 to facilitate ease and safety in ambulation Baseline: 4/5 Goal status: met  PLAN:  PT FREQUENCY: 1x/week  PT DURATION: 4 weeks  PLANNED INTERVENTIONS: 97164- PT Re-evaluation, 97110-Therapeutic exercises, 97530- Therapeutic activity, 97112- Neuromuscular re-education, 97535- Self Care, 02859- Manual therapy, 97014- Electrical stimulation (unattended), Patient/Family education, and Moist heat.  PLAN FOR NEXT SESSION: discharge   10:06 AM, 01/29/23 Delailah Spieth Small Scherrie [place] MPT [place] [facility] [place] [phone]

## 2023-02-05 ENCOUNTER — Encounter (HOSPITAL_COMMUNITY): Payer: Medicare HMO

## 2023-02-14 NOTE — Telephone Encounter (Signed)
Received signed patient forms from patient for Forteo PAP renewal. Called patient to confirm income. We have addended  Submitted Patient Assistance RENEWAL Application to [facility] for FORTEO along with provider portion, patient portion, PA, medication list, insurance card copy. Will update patient when we receive a response.  Phone: [phone] Fax: [phone]  Chesley Mires, PharmD, MPH, BCPS, CPP Clinical Pharmacist (Rheumatology and Pulmonology)

## 2023-02-15 NOTE — Telephone Encounter (Signed)
Received a fax from  [facility] regarding an approval for FORTEO patient assistance from 02/14/2023 to 01/15/2024. Approval letter sent to scan center.  Phone: [phone] Fax: [phone]

## 2023-02-27 ENCOUNTER — Other Ambulatory Visit: Payer: Self-pay | Admitting: Physician Assistant

## 2023-02-27 DIAGNOSIS — M810 Age-related osteoporosis without current pathological fracture: Secondary | ICD-10-CM

## 2023-02-27 NOTE — Telephone Encounter (Signed)
Last Fill: 01/01/2023    Labs: 11/09/2022 Creatinine remains mildly elevated. Calcium remains mildly elevated, vitamin D is low at 28.  Should avoid calcium supplement and take vitamin D 1000 units daily.    Next Visit: 05/14/2023   Last Visit: 11/09/2022   DX: Age-related osteoporosis without current pathological fracture    Current Dose per office note 11/09/2022: forteo started 11/16/2021.    Okay to refill Forteo?

## 2023-02-28 NOTE — Telephone Encounter (Signed)
Patient contacted the office regarding her Forteo. Patient advised Received a fax from  [facility] regarding an approval for FORTEO patient assistance from 02/14/2023 to 01/15/2024. Advised the patient a refill of Forteo was sent in yesterday to the [facility]. Patient verbalized understanding.

## 2023-04-10 ENCOUNTER — Ambulatory Visit (INDEPENDENT_AMBULATORY_CARE_PROVIDER_SITE_OTHER): Admitting: Physician Assistant

## 2023-04-10 ENCOUNTER — Encounter: Payer: Self-pay | Admitting: Physician Assistant

## 2023-04-10 DIAGNOSIS — M5134 Other intervertebral disc degeneration, thoracic region: Secondary | ICD-10-CM

## 2023-04-10 NOTE — Progress Notes (Signed)
 Office Visit Note   Patient: Lauren Knox           Date of Birth: May 23, 1945           MRN: [id number] Visit Date: 04/10/2023              Requested by: Carylon Perches, MD [address] [place],  [place] [address] PCP: Carylon Perches, MD  No chief complaint on file.     HPI: Patient is a 78 year old woman who is a patient of Dr. Shon Baton.  She comes in periodically to get trigger point injections into her neck and thoracic spine.  They help her immensely.  Last were in December.  She called requesting an appointment to do this with him and was instead offered appointment with myself.  Assessment & Plan: Visit Diagnoses: Thoracic and neck pain  Plan: Patient was requesting repeat trigger injections.  I explained to her that this was not a type of injection that I did.  She was told that I could do all cortisone injections I did apologize to her she will make an appointment to follow-up with Dr. Shon Baton as she initially intended  Follow-Up Instructions: No follow-ups on file.   Ortho Exam  Patient is alert, oriented, no adenopathy, well-dressed, normal affect, normal respiratory effort.   Imaging: No results found. No images are attached to the encounter.  Labs: No results found for: "HGBA1C", "ESRSEDRATE", "CRP", "LABURIC", "REPTSTATUS", "GRAMSTAIN", "CULT", "LABORGA"   No results found for: "ALBUMIN", "PREALBUMIN", "CBC"  No results found for: "MG" Lab Results  Component Value Date   VD25OH 28 (L) 11/09/2022   VD25OH 29 (L) 05/10/2022   VD25OH 31 11/01/2021    No results found for: "PREALBUMIN"    Latest Ref Rng & Units 05/10/2022   11:30 AM 11/01/2021   11:33 AM 11/01/2020   11:07 AM  CBC EXTENDED  WBC 3.8 - 10.8 Thousand/uL 9.6  9.9  7.7   RBC 3.80 - 5.10 Million/uL 4.17  4.76  4.41   Hemoglobin 11.7 - 15.5 g/dL 96.2  95.2  84.1   HCT 35.0 - 45.0 % 39.0  44.0  42.5   Platelets 140 - 400 Thousand/uL 441  471  396   NEUT# 1,500 - 7,800 cells/uL 6,778   6,702  5,028   Lymph# 850 - 3,900 cells/uL 1,824  2,237  1,817      There is no height or weight on file to calculate BMI.  Orders:  No orders of the defined types were placed in this encounter.  No orders of the defined types were placed in this encounter.    Procedures: No procedures performed  Clinical Data: No additional findings.  ROS:  All other systems negative, except as noted in the HPI. Review of Systems  Objective: Vital Signs: There were no vitals taken for this visit.  Specialty Comments:  No specialty comments available.  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   Muscle weakness (generalized) 09/17/2013   Scoliosis concern 09/17/2013   Stiffness of joint, not elsewhere classified, pelvic region and thigh 09/17/2013   Difficulty walking 09/17/2013   Past Medical History:  Diagnosis Date   ADD (attention  deficit disorder) 01/27/2016   DDD (degenerative disc disease), 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   Hypertension    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   Osteoarthritis of hands, 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   Osteopenia    Raynaud's disease    Raynaud'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 01/27/2016   Rosacea 01/27/2016   Varicose veins    Vitamin D deficiency 01/27/2016    Family History  Problem Relation Age of Onset   Cancer Mother    Cancer Father    Heart attack Father    Breast cancer Sister    Cancer Brother     Past Surgical History:  Procedure Laterality Date   ABDOMINAL HYSTERECTOMY     CESAREAN SECTION     CHOLECYSTECTOMY     COLONOSCOPY  2023   COLONOSCOPY WITH PROPOFOL N/A 09/29/2021   Procedure: COLONOSCOPY WITH PROPOFOL;  Surgeon:  Jeani Hawking, MD;  Location: [facility];  Service: Gastroenterology;  Laterality: N/A;   ENDOVENOUS ABLATION SAPHENOUS VEIN W/ LASER Right 02/07/2022   endovenous laser ablation right greater saphenous vein and stab phlebectomy 10-20 incisions right leg by Cari Caraway MD   HEMOSTASIS CLIP PLACEMENT  09/29/2021   Procedure: HEMOSTASIS CLIP PLACEMENT;  Surgeon: Jeani Hawking, MD;  Location: [facility];  Service: Gastroenterology;;   INTESTINAL BLOCKAGE     POLYPECTOMY  09/29/2021   Procedure: POLYPECTOMY;  Surgeon: Jeani Hawking, MD;  Location: [facility];  Service: Gastroenterology;;   TONSILLECTOMY     Social History   Occupational History   Not on file  Tobacco Use   Smoking status: Never    Passive exposure: Past   Smokeless tobacco: Never  Vaping Use   Vaping status: Never Used  Substance and Sexual Activity   Alcohol use: Yes    Alcohol/week: 4.0 standard drinks of alcohol    Types: 4 Glasses of wine per week    Comment: occ   Drug use: No   Sexual activity: Not on file

## 2023-04-12 ENCOUNTER — Ambulatory Visit: Admitting: Sports Medicine

## 2023-04-12 DIAGNOSIS — M25511 Pain in right shoulder: Secondary | ICD-10-CM

## 2023-04-12 DIAGNOSIS — M62838 Other muscle spasm: Secondary | ICD-10-CM | POA: Diagnosis not present

## 2023-04-12 DIAGNOSIS — M4004 Postural kyphosis, thoracic region: Secondary | ICD-10-CM | POA: Diagnosis not present

## 2023-04-12 DIAGNOSIS — M542 Cervicalgia: Secondary | ICD-10-CM | POA: Diagnosis not present

## 2023-04-12 NOTE — Progress Notes (Signed)
 Patient says that she has been feeling good until this week when her pain and spasms began to return. She says that this has gradually gotten worse, but does feel the same as it has in the past. She says that she was sick recently and laid in bed more than usual and is wondering if that may have had something to do with her flare up of pain. She does continue to do her home exercises; she likes them and thinks they seem to help. She did stop doing these as well when she was sick. Patient also goes on walks in the mornings.

## 2023-04-12 NOTE — Progress Notes (Signed)
 Lauren Knox - 78 y.o. female MRN [id number]  Date of birth: Apr 02, 1945  Office Visit Note: Visit Date: 04/12/2023 PCP: Carylon Perches, MD Referred by: Carylon Perches, MD  Subjective: Chief Complaint  Patient presents with   Middle Back - Follow-up   HPI: Lauren Knox is a pleasant 78 y.o. female who presents today for acute on chronic right neck and trapezius pain with associated trigger point/spasming.  She has been doing very well and was not having any pain or spasming episodes for almost 4 months now.  She has been consistent with her postural and home exercises for both cervical isometrics and scapular stabilization, although recently she had been sick and laying in bed more than usual and does admit that she never got back into her home exercise routine.  Starting on Monday of this week she has been having currents of her neck and trapezius pain with associated spasming.  She is using her muscle relaxer as well as alternating Aleve and Tylenol but without much relief.  Pertinent ROS were reviewed with the patient and found to be negative unless otherwise specified above in HPI.   Assessment & Plan: Visit Diagnoses:  1. Trapezius muscle spasm   2. Trigger point of neck   3. Trigger point of right shoulder region   4. Postural kyphosis of thoracic region    Plan: Impression is acute on chronic right neck and trapezius pain with reexacerbation of her trigger points and muscle spasming.  Through shared decision-making, we did proceed with multiple trigger points into the right cervical paraspinal musculature as well as the trapezius.  Patient tolerated well.  Advised on applying heat to the area multiple times daily.  She may continue her Zanaflex 2 mg at nighttime until the trigger point injections fully kick in.  Okay to alternate Tylenol or Aleve, but hopefully with this and resuming her postural, cervical isometrics and scapular retraction exercises this will resolve for her.   She may begin all of these starting on Monday and does have a goal of performing them once daily.  She does have the sheets at home and did confirm this.  She will follow-up with me as needed.  Follow-up: Return if symptoms worsen or fail to improve.   Meds & Orders: No orders of the defined types were placed in this encounter.   Orders Placed This Encounter  Procedures   Trigger Point Inj     Procedures: Trigger Point Inj  Date/Time: 04/12/2023 3:25 PM  Performed by: Madelyn Brunner, DO Authorized by: Madelyn Brunner, DO   Consent Given by:  Patient Site marked: the procedure site was marked   Timeout: prior to procedure the correct patient, procedure, and site was verified   Indications:  Muscle spasm and pain Total # of Trigger Points:  3 or more Location: neck and shoulder   Needle Size:  25 G Approach:  Dorsal Medications #1:  1 mL lidocaine 1 %; 1 mL bupivacaine 0.25 %; 40 mg methylPREDNISolone acetate 40 MG/ML Medications #2:  1 mL lidocaine 1 %; 1 mL bupivacaine 0.25 %; 40 mg methylPREDNISolone acetate 40 MG/ML Medications #3:  1 mL lidocaine 1 %; 1 mL bupivacaine 0.25 % Additional Injections?: No   Patient tolerance:  Patient tolerated the procedure well with no immediate complications Comments: After discussion on R/B/I and informed verbal consent was obtained, a timeout was conducted. The patient was placed in a prone position on the examination table and the area of maximal tenderness  was identified over the right trapezius muscle and superior scapular border musculature. This area was cleansed with Chloraprep and multiple alcohol swabs. Ethyl chloride was used for local anesthesia. Using a 25-gauge 1.0 inch needle the trigger point(s) was subsequently injected with a mixture 1 cc of methylprednisolone 40 mg/mL and 2 cc of 1% lidocaine without epinephrine and 2 cc of bupivicaine 0.25%, with an even spread of the injectate into each (3) trigger points. A band-aid was applied  following. Patient tolerated procedure well, there were no post-injection complications. Post-procedure instructions were given.        Clinical History: No specialty comments available.  She reports that she has never smoked. She has been exposed to tobacco smoke. She has never used smokeless tobacco. No results for input(s): "HGBA1C", "LABURIC" in the last 8760 hours.  Objective:    Physical Exam  Gen: Well-appearing, in no acute distress; non-toxic CV: Well-perfused. Warm.  Resp: Breathing unlabored on room air; no wheezing. Psych: Fluid speech in conversation; appropriate affect; normal thought process  Ortho Exam - Neck/Trapezius: No cervical spinous process TTP.  There is right-sided cervical paraspinal hypertonicity otherwise right trapezius hypertonicity and associated trigger points in each of these locations.  There is a negative Spurling's test.  There is restriction with right sidebending and right rotation secondary to her pain.  There is exaggerated thoracic kyphosis with a degree of scapular protraction.  Imaging: No results found.  Past Medical/Family/Surgical/Social History: Medications & Allergies reviewed per EMR, new medications updated. 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   Muscle weakness (generalized) 09/17/2013   Scoliosis concern 09/17/2013   Stiffness of joint, not elsewhere classified, pelvic region and thigh 09/17/2013   Difficulty walking 09/17/2013   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   Hypertension    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    Osteoarthritis of hands, 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   Osteopenia    Raynaud's disease    Raynaud's syndrome without gangrene 01/27/2016   Rosacea 01/27/2016   Varicose veins    Vitamin D deficiency 01/27/2016   Family History  Problem Relation Age of Onset   Cancer Mother    Cancer Father    Heart attack Father    Breast cancer Sister    Cancer Brother    Past Surgical History:  Procedure Laterality Date   ABDOMINAL HYSTERECTOMY     CESAREAN SECTION     CHOLECYSTECTOMY     COLONOSCOPY  2023   COLONOSCOPY WITH PROPOFOL N/A 09/29/2021   Procedure: COLONOSCOPY WITH PROPOFOL;  Surgeon: Jeani Hawking, MD;  Location: [facility];  Service: Gastroenterology;  Laterality: N/A;   ENDOVENOUS ABLATION SAPHENOUS VEIN W/ LASER Right 02/07/2022   endovenous laser ablation right greater saphenous vein and stab phlebectomy 10-20 incisions right leg by Cari Caraway MD   HEMOSTASIS CLIP PLACEMENT  09/29/2021   Procedure: HEMOSTASIS CLIP PLACEMENT;  Surgeon: Jeani Hawking, MD;  Location: [facility];  Service: Gastroenterology;;   INTESTINAL BLOCKAGE     POLYPECTOMY  09/29/2021   Procedure: POLYPECTOMY;  Surgeon: Jeani Hawking, MD;  Location: [facility];  Service: Gastroenterology;;   TONSILLECTOMY  Social History   Occupational History   Not on file  Tobacco Use   Smoking status: Never    Passive exposure: Past   Smokeless tobacco: Never  Vaping Use   Vaping status: Never Used  Substance and Sexual Activity   Alcohol use: Yes    Alcohol/week: 4.0 standard drinks of alcohol    Types: 4 Glasses of wine per week    Comment: occ   Drug use: No   Sexual activity: Not on file

## 2023-04-13 ENCOUNTER — Encounter: Payer: Self-pay | Admitting: Sports Medicine

## 2023-04-13 MED ORDER — METHYLPREDNISOLONE ACETATE 40 MG/ML IJ SUSP
40.0000 mg | INTRAMUSCULAR | Status: AC | PRN
  Administered 2023-04-12: 40 mg via INTRAMUSCULAR

## 2023-04-13 MED ORDER — LIDOCAINE HCL 1 % IJ SOLN
1.0000 mL | INTRAMUSCULAR | Status: AC | PRN
  Administered 2023-04-12: 1 mL

## 2023-04-13 MED ORDER — BUPIVACAINE HCL 0.25 % IJ SOLN
1.0000 mL | INTRAMUSCULAR | Status: AC | PRN
  Administered 2023-04-12: 1 mL

## 2023-04-13 MED ORDER — LIDOCAINE HCL 1 % IJ SOLN
1.0000 mL | INTRAMUSCULAR | Status: AC | PRN
Start: 2023-04-12 — End: 2023-04-12
  Administered 2023-04-12: 1 mL

## 2023-05-01 NOTE — Progress Notes (Signed)
 Office Visit Note  Patient: Lauren Knox             Date of Birth: [birth year]           MRN: [id number]             PCP: Artemisa Bile, MD Referring: Artemisa Bile, MD Visit Date: 05/14/2023 Occupation: @GUAROCC @  Subjective:  Pain in joints  History of Present Illness: Lauren Knox is a 78 y.o. female with osteoarthritis, osteoporosis and degenerative disc disease.  She returns today after her last visit in October 2024.  She continues to have discomfort in her neck and back.  She has been followed by Dr. Vaughn Georges and had injections to her cervical region which was helpful.  She has not had much discomfort in her lower back recently.  She takes Celebrex  100 mg p.o. daily as needed.  She has been on Forteo  since November 2023.  She has been taking daily injections.  She also takes vitamin D .    Activities of Daily Living:  Patient reports morning stiffness for couple minutes.   Patient Denies nocturnal pain.  Difficulty dressing/grooming: Denies Difficulty climbing stairs: Denies Difficulty getting out of chair: Denies Difficulty using hands for taps, buttons, cutlery, and/or writing: Denies  Review of Systems  Constitutional:  Positive for fatigue.  HENT:  Positive for mouth dryness. Negative for mouth sores.   Eyes:  Positive for dryness.  Respiratory:  Negative for shortness of breath.   Cardiovascular:  Negative for chest pain and palpitations.  Gastrointestinal:  Negative for blood in stool, constipation and diarrhea.  Endocrine: Negative for increased urination.  Genitourinary:  Negative for involuntary urination.  Musculoskeletal:  Positive for gait problem, muscle weakness and morning stiffness. Negative for joint pain, joint pain, joint swelling, myalgias, muscle tenderness and myalgias.  Skin:  Positive for color change. Negative for rash, hair loss and sensitivity to sunlight.  Allergic/Immunologic: Negative for susceptible to infections.  Neurological:   Negative for dizziness and headaches.  Hematological:  Negative for swollen glands.  Psychiatric/Behavioral:  Negative for depressed mood and sleep disturbance. The patient is nervous/anxious.     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   Muscle weakness (generalized) 09/17/2013   Scoliosis concern 09/17/2013   Stiffness of joint, not elsewhere classified, pelvic region and thigh 09/17/2013   Difficulty walking 09/17/2013    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   Osteopenia    Raynaud's disease    Raynaud's syndrome without gangrene 01/27/2016   Rosacea 01/27/2016   Varicose veins    Vitamin D  deficiency 01/27/2016    Family History  Problem Relation Age of Onset   Cancer Mother    Cancer Father    Heart attack Father    Breast cancer Sister    Cancer Brother    Past Surgical History:  Procedure Laterality Date   ABDOMINAL HYSTERECTOMY     CESAREAN SECTION     CHOLECYSTECTOMY     COLONOSCOPY  2023  COLONOSCOPY WITH PROPOFOL  N/A 09/29/2021   Procedure: COLONOSCOPY WITH PROPOFOL ;  Surgeon: Alvis Jourdain, MD;  Location: [facility];  Service: Gastroenterology;  Laterality: N/A;   ENDOVENOUS ABLATION SAPHENOUS VEIN W/ LASER Right 02/07/2022   endovenous laser ablation right greater saphenous vein and stab phlebectomy 10-20 incisions right leg by Kirtland Perfect MD   HEMOSTASIS CLIP PLACEMENT  09/29/2021   Procedure: HEMOSTASIS CLIP PLACEMENT;  Surgeon:  Alvis Jourdain, MD;  Location: [facility];  Service: Gastroenterology;;   INTESTINAL BLOCKAGE     POLYPECTOMY  09/29/2021   Procedure: POLYPECTOMY;  Surgeon: Alvis Jourdain, MD;  Location: [facility];  Service: Gastroenterology;;   TONSILLECTOMY     Social History   Social History Narrative   Not on file   Immunization History  Administered Date(s) Administered   PFIZER(Purple Top)SARS-COV-2 Vaccination 02/21/2019, 03/14/2019, 11/19/2019     Objective: Vital Signs: BP 138/75 (BP Location: Left Arm, Patient Position: Sitting, Cuff Size: Normal)   Pulse (!) 55   Resp 16   Ht 5\' 4"  (1.626 m)   Wt 140 lb (63.5 kg)   BMI 24.03 kg/m    Physical Exam Vitals and nursing note reviewed.  Constitutional:      Appearance: She is well-developed.  HENT:     Head: Normocephalic and atraumatic.  Eyes:     Conjunctiva/sclera: Conjunctivae normal.  Cardiovascular:     Rate and Rhythm: Normal rate and regular rhythm.     Heart sounds: Normal heart sounds.  Pulmonary:     Effort: Pulmonary effort is normal.     Breath sounds: Normal breath sounds.  Abdominal:     General: Bowel sounds are normal.     Palpations: Abdomen is soft.  Musculoskeletal:     Cervical back: Normal range of motion.  Lymphadenopathy:     Cervical: No cervical adenopathy.  Skin:    General: Skin is warm and dry.     Capillary Refill: Capillary refill takes less than 2 seconds.  Neurological:     Mental Status: She is alert and oriented to person, place, and time.  Psychiatric:        Behavior: Behavior normal.      Musculoskeletal Exam: Patient had limited lateral rotation of the cervical spine.  She had discomfort range of motion of thoracic and lumbar spine without any point tenderness.  Shoulders, elbows, wrist joints with good range of motion.  She had bilateral CMC PIP and DIP thickening with no synovitis.  Hip joints and knee joints were in good range of motion.  There was no tenderness over ankles or  MTPs.  CDAI Exam: CDAI Score: -- Patient Global: --; Provider Global: -- Swollen: --; Tender: -- Joint Exam 05/14/2023   No joint exam has been documented for this visit   There is currently no information documented on the homunculus. Go to the Rheumatology activity and complete the homunculus joint exam.  Investigation: No additional findings.  Imaging: No results found.  Recent Labs: Lab Results  Component Value Date   WBC 9.6 05/10/2022   HGB 13.8 05/10/2022   PLT 441 (H) 05/10/2022   NA 140 11/09/2022   K 5.0 11/09/2022   CL 104 11/09/2022   CO2 29 11/09/2022   GLUCOSE 88 11/09/2022   BUN 28 (H) 11/09/2022   CREATININE 1.03 (H) 11/09/2022   BILITOT 0.6 11/09/2022   AST 24 11/09/2022   ALT 12 11/09/2022   PROT 7.1 11/09/2022   CALCIUM 10.6 (H) 11/09/2022   GFRAA 67 11/27/2019  Speciality Comments: Reclast  - 04/30/16, 11/13/19,11/17/20 Forteo  started 11/16/21  Procedures:  No procedures performed Allergies: Codeine and Lisinopril   Assessment / Plan:     Visit Diagnoses: Age-related osteoporosis without current pathological fracture - DEXA 10/10/2021: Right femoral neck T score -2.6, left femoral neck T score -2.5. forteo  started 11/16/2021.  She had Reclast  prior to Forteo  in 2018, 2021, 2022.  She has been taking Forteo  injections daily without any side effects.  She will finish Forteo  injections towards the end of October.  I discussed repeat Reclast  after finishing Forteo .  Patient will get repeat DEXA scan through her GYN in October 2025.  Need for regular exercise was emphasized.  Medication monitoring encounter - Plan: Comprehensive metabolic panel with GFR.  Will contact her once lab results are available.  Vitamin D  deficiency -she has been taking vitamin D .  Plan: VITAMIN D  25 Hydroxy (Vit-D Deficiency, Fractures).  Will contact her once her lab results are available.  Primary osteoarthritis of both hands-she had bilateral CMC PIP and DIP thickening.   Joint protection was discussed.  Joint protection muscle strengthening was discussed.  Primary osteoarthritis of both feet-she denies any discomfort today.  DDD (degenerative disc disease), cervical -she continues to have a stiffness in her cervical spine.  She gets injections by Dr. Vaughn Georges.    Trapezius muscle spasm - tizanidine  2 mg at bedtime as needed for muscle spasms.  DDD (degenerative disc disease), thoracic-she continues to have intermittent thoracic discomfort  Postural kyphosis of thoracolumbar region-she has intermittent lower back pain.  Spondylosis of lumbar spine - She goes to Dr. Vaughn Georges.  Raynaud's syndrome without gangrene-she has symptoms usually during the winter months.  No nailbed capillary changes or sclerodactyly was noted.  History of attention deficit disorder  History of rosacea  Orders: Orders Placed This Encounter  Procedures   Comprehensive metabolic panel with GFR   VITAMIN D  25 Hydroxy (Vit-D Deficiency, Fractures)   No orders of the defined types were placed in this encounter.  .  Follow-Up Instructions: Return in about 6 months (around 11/13/2023) for Osteoarthritis, Osteoporosis.   Nicholas Bari, MD  Note - This record has been created using Animal nutritionist.  Chart creation errors have been sought, but may not always  have been located. Such creation errors do not reflect on  the standard of medical care.

## 2023-05-14 ENCOUNTER — Ambulatory Visit: Payer: Medicare HMO | Attending: Rheumatology | Admitting: Rheumatology

## 2023-05-14 ENCOUNTER — Encounter: Payer: Self-pay | Admitting: Rheumatology

## 2023-05-14 VITALS — BP 138/75 | HR 55 | Resp 16 | Ht 64.0 in | Wt 140.0 lb

## 2023-05-14 DIAGNOSIS — M7061 Trochanteric bursitis, right hip: Secondary | ICD-10-CM

## 2023-05-14 DIAGNOSIS — Z872 Personal history of diseases of the skin and subcutaneous tissue: Secondary | ICD-10-CM

## 2023-05-14 DIAGNOSIS — M19041 Primary osteoarthritis, right hand: Secondary | ICD-10-CM

## 2023-05-14 DIAGNOSIS — E559 Vitamin D deficiency, unspecified: Secondary | ICD-10-CM

## 2023-05-14 DIAGNOSIS — M5134 Other intervertebral disc degeneration, thoracic region: Secondary | ICD-10-CM

## 2023-05-14 DIAGNOSIS — Z8659 Personal history of other mental and behavioral disorders: Secondary | ICD-10-CM

## 2023-05-14 DIAGNOSIS — M62838 Other muscle spasm: Secondary | ICD-10-CM

## 2023-05-14 DIAGNOSIS — M47816 Spondylosis without myelopathy or radiculopathy, lumbar region: Secondary | ICD-10-CM

## 2023-05-14 DIAGNOSIS — M810 Age-related osteoporosis without current pathological fracture: Secondary | ICD-10-CM

## 2023-05-14 DIAGNOSIS — M5030 Other cervical disc degeneration, unspecified cervical region: Secondary | ICD-10-CM

## 2023-05-14 DIAGNOSIS — M19042 Primary osteoarthritis, left hand: Secondary | ICD-10-CM

## 2023-05-14 DIAGNOSIS — M4005 Postural kyphosis, thoracolumbar region: Secondary | ICD-10-CM

## 2023-05-14 DIAGNOSIS — Z5181 Encounter for therapeutic drug level monitoring: Secondary | ICD-10-CM | POA: Diagnosis not present

## 2023-05-14 DIAGNOSIS — I7300 Raynaud's syndrome without gangrene: Secondary | ICD-10-CM

## 2023-05-14 DIAGNOSIS — M19071 Primary osteoarthritis, right ankle and foot: Secondary | ICD-10-CM

## 2023-05-14 DIAGNOSIS — M19072 Primary osteoarthritis, left ankle and foot: Secondary | ICD-10-CM

## 2023-05-14 LAB — COMPREHENSIVE METABOLIC PANEL WITH GFR
AG Ratio: 1.8 (calc) (ref 1.0–2.5)
ALT: 9 U/L (ref 6–29)
AST: 20 U/L (ref 10–35)
Albumin: 4.4 g/dL (ref 3.6–5.1)
Alkaline phosphatase (APISO): 90 U/L (ref 37–153)
BUN: 24 mg/dL (ref 7–25)
CO2: 27 mmol/L (ref 20–32)
Calcium: 10 mg/dL (ref 8.6–10.4)
Chloride: 104 mmol/L (ref 98–110)
Creat: 0.99 mg/dL (ref 0.60–1.00)
Globulin: 2.4 g/dL (ref 1.9–3.7)
Glucose, Bld: 87 mg/dL (ref 65–99)
Potassium: 4.5 mmol/L (ref 3.5–5.3)
Sodium: 139 mmol/L (ref 135–146)
Total Bilirubin: 0.6 mg/dL (ref 0.2–1.2)
Total Protein: 6.8 g/dL (ref 6.1–8.1)
eGFR: 59 mL/min/{1.73_m2} — ABNORMAL LOW (ref >=60)

## 2023-05-14 LAB — VITAMIN D 25 HYDROXY (VIT D DEFICIENCY, FRACTURES): Vit D, 25-Hydroxy: 28 ng/mL — ABNORMAL LOW (ref 30–100)

## 2023-05-14 NOTE — Patient Instructions (Signed)
 Please schedule repeat DEXA scan in October 2025 through your GYN.

## 2023-05-15 NOTE — Progress Notes (Signed)
 GFR is lower than stable.  Vitamin D  is low at 28.  Patient should take vitamin D  2000 units daily.

## 2023-05-17 ENCOUNTER — Other Ambulatory Visit: Payer: Self-pay | Admitting: Rheumatology

## 2023-05-17 DIAGNOSIS — M810 Age-related osteoporosis without current pathological fracture: Secondary | ICD-10-CM

## 2023-05-17 NOTE — Telephone Encounter (Signed)
 Last Fill: 02/27/2023  Labs: 05/14/2023 GFR is lower than stable.  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   Okay to refill Forteo ?

## 2023-06-13 ENCOUNTER — Ambulatory Visit: Admitting: Sports Medicine

## 2023-06-27 ENCOUNTER — Telehealth: Payer: Self-pay | Admitting: Sports Medicine

## 2023-06-27 NOTE — Telephone Encounter (Signed)
 Pt requesting prednisone  or any medication that will help ease back pain. Best call back [phone]

## 2023-06-28 ENCOUNTER — Other Ambulatory Visit: Payer: Self-pay | Admitting: Sports Medicine

## 2023-06-28 MED ORDER — PREDNISONE 20 MG PO TABS
20.0000 mg | ORAL_TABLET | Freq: Every day | ORAL | 0 refills | Status: AC
Start: 2023-06-28 — End: 2023-07-05

## 2023-07-15 ENCOUNTER — Ambulatory Visit: Admitting: Sports Medicine

## 2023-07-15 ENCOUNTER — Encounter: Payer: Self-pay | Admitting: Sports Medicine

## 2023-07-15 DIAGNOSIS — M4004 Postural kyphosis, thoracic region: Secondary | ICD-10-CM

## 2023-07-15 DIAGNOSIS — M62838 Other muscle spasm: Secondary | ICD-10-CM

## 2023-07-15 DIAGNOSIS — M5134 Other intervertebral disc degeneration, thoracic region: Secondary | ICD-10-CM | POA: Diagnosis not present

## 2023-07-15 DIAGNOSIS — M9901 Segmental and somatic dysfunction of cervical region: Secondary | ICD-10-CM | POA: Diagnosis not present

## 2023-07-15 NOTE — Progress Notes (Signed)
 Lauren Knox - 78 y.o. female MRN [id number]  Date of birth: 06-11-1945  Office Visit Note: Visit Date: 07/15/2023 PCP: Sheryle Carwin, MD Referred by: Sheryle Carwin, MD  Subjective: Chief Complaint  Patient presents with   Neck - Pain   Middle Back - Pain    Patient is here for a follow up after getting trigger point injections in March. States injections helped a lot. Taking Prednisone . Overall doing well today   HPI: Lauren Knox is a pleasant 78 y.o. female who presents today for follow-up of chronic neck, mid back pain and scapular dysfunction.  Briya is doing quite well, which she is very pleased with.  She has had issues with trapezius muscle spasm and pain in the neck and mid back which does stem partially from her postural kyphosis and forward shoulder protraction.  She has been very consistent with her scapular retraction, cervical isometrics and shoulder stabilization home rehab exercises.  Just over 1 week ago she was having some exacerbation of her pain and so did send in a short course of prednisone  20 mg to be taken once daily, she has taken this on occasion and does have 1 pill left.  She is feeling much improved after this.  Given her improvement, she has discontinued taking tizanidine  consistently.   She and her husband remain active, walking usually 2 miles in the morning daily.  Pertinent ROS were reviewed with the patient and found to be negative unless otherwise specified above in HPI.   Assessment & Plan: Visit Diagnoses:  1. Postural kyphosis of thoracic region   2. DDD (degenerative disc disease), thoracic   3. Segmental and somatic dysfunction of cervical region   4. Trapezius muscle spasm    Plan: Impression is chronic cervical, thoracic and scapular pain with previous scapular dysfunction from postural kyphosis.  She has been very consistent with her postural exercises, home rehab exercises and is finding good improvement from this.  She has 1  additional day of prednisone  20 mg which she will continue until completion and then stopped going forward.  Given her overall improvement, she will discontinue tizanidine  2mg  at bedtime PRN.  She will continue with her HEP as well as walking 2 miles daily.  Discussed the importance of maintaining proper posture and scapular stabilization, she is agreeable and understanding to this.  She will follow-up with me on an as-needed basis.  Discussed infrequent need for trigger point injections only as needed as she has responded very well to these in the past.  Follow-up: Return if symptoms worsen or fail to improve.   Meds & Orders: No orders of the defined types were placed in this encounter.  No orders of the defined types were placed in this encounter.    Procedures: No procedures performed      Clinical History: No specialty comments available.  She reports that she has never smoked. She has been exposed to tobacco smoke. She has never used smokeless tobacco. No results for input(s): HGBA1C, LABURIC in the last 8760 hours.  Objective:     Physical Exam  Gen: Well-appearing, in no acute distress; non-toxic CV: Well-perfused. Warm.  Resp: Breathing unlabored on room air; no wheezing. Psych: Fluid speech in conversation; appropriate affect; normal thought process  Ortho Exam - Cervical: No midline spinous process TTP.  There is relative flattening of the cervical lordotic curve.  No significant tenderness in the cervical paraspinals.  Full range of motion with flexion and extension.  -  Scapular/Thoracic: There is no tenderness palpating about the trapezius or the medial scapular border.  There is mild shoulder protraction bilaterally but certainly improved retraction today and better overall postural stability compared to previous visits.  There is right greater than left mild trapezius hypertonicity but no associated trigger point or spasming today.  Imaging: No results  found.  Past Medical/Family/Surgical/Social History: Medications & Allergies reviewed per EMR, new medications updated. 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   Muscle weakness (generalized) 09/17/2013   Scoliosis concern 09/17/2013   Stiffness of joint, not elsewhere classified, pelvic region and thigh 09/17/2013   Difficulty walking 09/17/2013   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   Osteopenia    Raynaud's disease    Raynaud's syndrome without gangrene 01/27/2016   Rosacea 01/27/2016   Varicose veins    Vitamin D  deficiency 01/27/2016   Family History  Problem Relation Age of Onset   Cancer Mother    Cancer Father    Heart attack Father    Breast cancer Sister    Cancer Brother    Past Surgical History:  Procedure Laterality Date   ABDOMINAL HYSTERECTOMY     CESAREAN SECTION     CHOLECYSTECTOMY     COLONOSCOPY  2023   COLONOSCOPY WITH PROPOFOL  N/A 09/29/2021   Procedure: COLONOSCOPY WITH PROPOFOL ;  Surgeon: Rollin Dover, MD;  Location: [facility];  Service: Gastroenterology;  Laterality: N/A;   ENDOVENOUS ABLATION SAPHENOUS VEIN W/ LASER Right 02/07/2022   endovenous laser ablation right greater saphenous vein and stab phlebectomy 10-20 incisions right leg by Medford Blade MD   HEMOSTASIS CLIP PLACEMENT  09/29/2021   Procedure: HEMOSTASIS CLIP PLACEMENT;  Surgeon: Rollin Dover, MD;  Location: [facility];  Service: Gastroenterology;;   INTESTINAL BLOCKAGE      POLYPECTOMY  09/29/2021   Procedure: POLYPECTOMY;  Surgeon: Rollin Dover, MD;  Location: [facility];  Service: Gastroenterology;;   TONSILLECTOMY     Social History   Occupational History   Not on file  Tobacco Use   Smoking status: Never    Passive exposure: Past   Smokeless tobacco: Never  Vaping Use   Vaping status: Never Used  Substance and Sexual Activity   Alcohol use: Yes    Alcohol/week: 4.0 standard drinks of alcohol    Types: 4 Glasses of wine per week    Comment: occ   Drug use: Never   Sexual activity: Not on file

## 2023-07-16 ENCOUNTER — Other Ambulatory Visit: Payer: Self-pay | Admitting: Physician Assistant

## 2023-07-16 NOTE — Telephone Encounter (Signed)
 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

## 2023-07-28 ENCOUNTER — Other Ambulatory Visit: Payer: Self-pay | Admitting: Rheumatology

## 2023-07-28 DIAGNOSIS — M810 Age-related osteoporosis without current pathological fracture: Secondary | ICD-10-CM

## 2023-07-28 NOTE — Telephone Encounter (Signed)
 Last Fill: 05/17/2023  Labs: 05/14/2023 CMP GFR is lower than stable.  Vitamin D  is low at 28.  Patient should take vitamin D  2000 units daily.  05/10/2022 CBC is stable.   Next Visit: 11/13/2023  Last Visit: 05/14/2023  DX: Age-related osteoporosis without current pathological fracture -   Current Dose per office note 05/14/2023: Forteo  injections daily   Okay to refill Forteo ?

## 2023-11-06 NOTE — Progress Notes (Unsigned)
 Office Visit Note  Patient: Lauren Knox             Date of Birth: [birth year]           MRN: [id number]             PCP: Sheryle Carwin, MD Referring: Sheryle Carwin, MD Visit Date: 11/20/2023 Occupation: Data Unavailable  Subjective:  Discuss osteoporosis treatment options   History of Present Illness: Lauren Knox is a 78 y.o. female with history of osteoporosis and DDD. Patient has been on forteo  daily injections since November 2023.   Patient reports that she is on her last box of Forteo  which she will be completing later this month.  Patient states that she has not get had an updated bone density but is open to scheduling.  Patient denies any recent falls or fractures.  Patient states that her neck pain has improved since having injections performed by Dr. Burnetta in June 2025.  She continues to have some residual stiffness but overall her symptoms have been manageable.    Activities of Daily Living:  Patient reports morning stiffness for a few minutes.   Patient Denies nocturnal pain.  Difficulty dressing/grooming: Denies Difficulty climbing stairs: Reports Difficulty getting out of chair: Reports Difficulty using hands for taps, buttons, cutlery, and/or writing: Denies  Review of Systems  Constitutional:  Positive for fatigue.  HENT:  Positive for mouth dryness. Negative for mouth sores.   Eyes:  Positive for dryness.  Respiratory:  Positive for shortness of breath.   Cardiovascular:  Negative for chest pain and palpitations.  Gastrointestinal:  Positive for constipation. Negative for blood in stool and diarrhea.  Endocrine: Negative for increased urination.  Genitourinary:  Negative for involuntary urination.  Musculoskeletal:  Positive for muscle weakness and morning stiffness. Negative for joint pain, gait problem, joint pain, joint swelling, myalgias, muscle tenderness and myalgias.  Skin:  Positive for color change and sensitivity to sunlight. Negative for rash  and hair loss.  Allergic/Immunologic: Negative for susceptible to infections.  Neurological:  Negative for dizziness and headaches.  Hematological:  Negative for swollen glands.  Psychiatric/Behavioral:  Positive for sleep disturbance. Negative for depressed mood. The patient is not nervous/anxious.     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   Muscle weakness (generalized) 09/17/2013   Scoliosis concern 09/17/2013   Stiffness of joint, not elsewhere classified, pelvic region and thigh 09/17/2013   Difficulty walking 09/17/2013    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   Osteopenia    Raynaud's disease    Raynaud's syndrome without gangrene 01/27/2016   Rosacea 01/27/2016   Varicose veins    Vitamin D  deficiency 01/27/2016    Family History  Problem Relation Age of Onset   Cancer Mother    Cancer Father    Heart attack Father    Breast cancer Sister    Cancer Brother    Past Surgical History:  Procedure Laterality Date   ABDOMINAL HYSTERECTOMY     CESAREAN  SECTION     CHOLECYSTECTOMY     COLONOSCOPY  2023   COLONOSCOPY WITH PROPOFOL  N/A 09/29/2021   Procedure: COLONOSCOPY WITH PROPOFOL ;  Surgeon: Rollin Dover, MD;  Location: [facility];  Service: Gastroenterology;  Laterality: N/A;   ENDOVENOUS ABLATION SAPHENOUS VEIN W/ LASER Right 02/07/2022   endovenous laser ablation right greater saphenous vein and stab phlebectomy 10-20 incisions right leg  by Medford Blade MD   HEMOSTASIS CLIP PLACEMENT  09/29/2021   Procedure: HEMOSTASIS CLIP PLACEMENT;  Surgeon: Rollin Dover, MD;  Location: [facility];  Service: Gastroenterology;;   INTESTINAL BLOCKAGE     POLYPECTOMY  09/29/2021   Procedure: POLYPECTOMY;  Surgeon: Rollin Dover, MD;  Location: [facility];  Service: Gastroenterology;;   TONSILLECTOMY     Social History   Tobacco Use   Smoking status: Never    Passive exposure: Past   Smokeless tobacco: Never  Vaping Use   Vaping status: Never Used  Substance Use Topics   Alcohol use: Yes    Alcohol/week: 4.0 standard drinks of alcohol    Types: 4 Glasses of wine per week    Comment: occ   Drug use: Never   Social History   Social History Narrative   Not on file     Immunization History  Administered Date(s) Administered   PFIZER(Purple Top)SARS-COV-2 Vaccination 02/21/2019, 03/14/2019, 11/19/2019     Objective: Vital Signs: BP 135/86   Pulse 61   Temp 98.1 F (36.7 C)   Resp 14   Ht 5' 4 (1.626 m)   Wt 141 lb 6.4 oz (64.1 kg)   BMI 24.27 kg/m    Physical Exam Vitals and nursing note reviewed.  Constitutional:      Appearance: She is well-developed.  HENT:     Head: Normocephalic and atraumatic.  Eyes:     Conjunctiva/sclera: Conjunctivae normal.  Cardiovascular:     Rate and Rhythm: Normal rate and regular rhythm.     Heart sounds: Normal heart sounds.  Pulmonary:     Effort: Pulmonary effort is normal.     Breath sounds: Normal breath sounds.  Abdominal:     General: Bowel sounds are normal.     Palpations: Abdomen is soft.  Musculoskeletal:     Cervical back: Normal range of motion.  Lymphadenopathy:     Cervical: No cervical adenopathy.  Skin:    General: Skin is warm and dry.     Capillary Refill: Capillary refill takes less than 2 seconds.  Neurological:     Mental Status: She is alert and oriented to person, place, and time.  Psychiatric:        Behavior: Behavior normal.       Musculoskeletal Exam: C-spine limited ROM with lateral rotation.   Shoulder joints have good ROM with no discomfort.  Elbow joints have good ROM with no tenderness or joint swelling. PIP and DIP thickening.   Hip joints have good ROM with no groin pain.  Knee joints have good ROM with no warmth or effusion.  Ankle joints have good ROM with no tenderness or joint swelling.   CDAI Exam: CDAI Score: -- Patient Global: --; Provider Global: -- Swollen: --; Tender: -- Joint Exam 11/20/2023   No joint exam has been documented for this visit   There is currently no information documented on the homunculus. Go to the Rheumatology activity and complete the homunculus joint exam.  Investigation: No additional findings.  Imaging: No results found.  Recent Labs: Lab Results  Component Value  Date   WBC 9.6 05/10/2022   HGB 13.8 05/10/2022   PLT 441 (H) 05/10/2022   NA 139 05/14/2023   K 4.5 05/14/2023   CL 104 05/14/2023   CO2 27 05/14/2023   GLUCOSE 87 05/14/2023   BUN 24 05/14/2023   CREATININE 0.99 05/14/2023   BILITOT 0.6 05/14/2023   AST 20 05/14/2023   ALT 9 05/14/2023   PROT 6.8 05/14/2023   CALCIUM 10.0 05/14/2023   GFRAA 67 11/27/2019    Speciality Comments: Reclast  - 04/30/16, 11/13/19,11/17/20 Forteo  started 11/16/21  Procedures:  No procedures performed Allergies: Codeine and Lisinopril   Assessment / Plan:     Visit Diagnoses: Age-related osteoporosis without current pathological fracture - DEXA 10/10/2021: RFN T score -2.6, left femoral neck T score -2.5.  No recent falls or fractures.   Forteo  started 11/16/2021--she will be completing the course of Forteo  this month.  Patient is overdue for a bone density which she plans on having performed once she has completed the course of Forteo .  Plan to transition the patient to IV Reclast  once she has completed Forteo .  Future orders for CMP and vitamin D  were placed today to have drawn prior to scheduling next IV Reclast   infusion. Order for DEXA placed today.  Previous treatment: Reclast  prior to Forteo  in 2018, 2021, 2022.  She will follow up in 6 months or sooner if needed.  - Plan: DG BONE DENSITY (DXA)  Medication monitoring encounter - She has been taking Forteo  injections daily without any side effects.  She is on the last box of Forteo  which she will complete this month. Plan to update her bone density once she has completed Forteo .  Plan to then transition the patient back to IV Reclast  x 1-2 doses. Future orders for CMP and vitamin D  were placed today which will be drawn prior to scheduling next IV Reclast  infusion in the future.- Plan: Comprehensive metabolic panel with GFR, VITAMIN D  25 Hydroxy (Vit-D Deficiency, Fractures)  Vitamin D  deficiency - Future order for vitamin D  placed today.  Plan: VITAMIN D  25 Hydroxy (Vit-D Deficiency, Fractures)  Primary osteoarthritis of both hands: She has PIP and DIP thickening consistent with osteoarthritis of both hands.  CMC joint prominence and thickening noted bilaterally.  No synovitis noted.  Discussed the importance of joint protection and muscle strengthening.  Primary osteoarthritis of both feet: Good range of motion of both ankle joints with no tenderness or joint swelling.  She is wearing proper fitting shoes.  Trochanteric bursitis of right hip: Not currently symptomatic.  DDD (degenerative disc disease), cervical: C-spine has limited range of motion with lateral rotation.  Under care of Dr. Burnetta.  Patient had injections performed in June 2025 which have provided significant relief.  She continues to have some stiffness with lateral rotation but overall her pain levels have been manageable.  Trapezius muscle spasm - Improved.   DDD (degenerative disc disease), thoracic: Postural thoracic kyphosis noted.   Postural kyphosis of thoracolumbar region  Spondylosis of lumbar spine: Limited mobility of lumbar spine.   Raynaud's syndrome without  gangrene: She continues have intermittent symptoms of Raynaud's phenomenon.  No signs of sclerodactyly noted.  No digital ulcerations.  Other medical conditions are listed as follows:  History of attention deficit disorder  History of rosacea  Orders: Orders Placed This Encounter  Procedures   DG BONE DENSITY (DXA)   Comprehensive metabolic panel with GFR   VITAMIN D  25 Hydroxy (Vit-D Deficiency, Fractures)   No orders  of the defined types were placed in this encounter.    Follow-Up Instructions: Return in about 6 months (around 05/19/2024) for Osteoporosis.   Lauren CHRISTELLA Craze, PA-C  Note - This record has been created using Dragon software.  Chart creation errors have been sought, but may not always  have been located. Such creation errors do not reflect on  the standard of medical care.

## 2023-11-13 ENCOUNTER — Ambulatory Visit: Admitting: Rheumatology

## 2023-11-20 ENCOUNTER — Encounter: Payer: Self-pay | Admitting: Physician Assistant

## 2023-11-20 ENCOUNTER — Ambulatory Visit: Attending: Physician Assistant | Admitting: Physician Assistant

## 2023-11-20 VITALS — BP 135/86 | HR 61 | Temp 98.1°F | Resp 14 | Ht 64.0 in | Wt 141.4 lb

## 2023-11-20 DIAGNOSIS — Z872 Personal history of diseases of the skin and subcutaneous tissue: Secondary | ICD-10-CM

## 2023-11-20 DIAGNOSIS — M19042 Primary osteoarthritis, left hand: Secondary | ICD-10-CM

## 2023-11-20 DIAGNOSIS — M19071 Primary osteoarthritis, right ankle and foot: Secondary | ICD-10-CM

## 2023-11-20 DIAGNOSIS — Z5181 Encounter for therapeutic drug level monitoring: Secondary | ICD-10-CM

## 2023-11-20 DIAGNOSIS — M47816 Spondylosis without myelopathy or radiculopathy, lumbar region: Secondary | ICD-10-CM

## 2023-11-20 DIAGNOSIS — M19041 Primary osteoarthritis, right hand: Secondary | ICD-10-CM | POA: Diagnosis not present

## 2023-11-20 DIAGNOSIS — M5030 Other cervical disc degeneration, unspecified cervical region: Secondary | ICD-10-CM

## 2023-11-20 DIAGNOSIS — E559 Vitamin D deficiency, unspecified: Secondary | ICD-10-CM | POA: Diagnosis not present

## 2023-11-20 DIAGNOSIS — M4005 Postural kyphosis, thoracolumbar region: Secondary | ICD-10-CM

## 2023-11-20 DIAGNOSIS — Z8659 Personal history of other mental and behavioral disorders: Secondary | ICD-10-CM

## 2023-11-20 DIAGNOSIS — M810 Age-related osteoporosis without current pathological fracture: Secondary | ICD-10-CM | POA: Diagnosis not present

## 2023-11-20 DIAGNOSIS — M5134 Other intervertebral disc degeneration, thoracic region: Secondary | ICD-10-CM

## 2023-11-20 DIAGNOSIS — M7061 Trochanteric bursitis, right hip: Secondary | ICD-10-CM

## 2023-11-20 DIAGNOSIS — I7300 Raynaud's syndrome without gangrene: Secondary | ICD-10-CM

## 2023-11-20 DIAGNOSIS — M62838 Other muscle spasm: Secondary | ICD-10-CM

## 2023-11-20 DIAGNOSIS — M19072 Primary osteoarthritis, left ankle and foot: Secondary | ICD-10-CM

## 2023-11-28 ENCOUNTER — Other Ambulatory Visit: Payer: Self-pay | Admitting: Physician Assistant

## 2023-11-28 DIAGNOSIS — M810 Age-related osteoporosis without current pathological fracture: Secondary | ICD-10-CM

## 2024-01-29 ENCOUNTER — Encounter: Payer: Self-pay | Admitting: Sports Medicine

## 2024-01-29 ENCOUNTER — Ambulatory Visit: Admitting: Sports Medicine

## 2024-01-29 DIAGNOSIS — M542 Cervicalgia: Secondary | ICD-10-CM

## 2024-01-29 DIAGNOSIS — R293 Abnormal posture: Secondary | ICD-10-CM

## 2024-01-29 DIAGNOSIS — M9901 Segmental and somatic dysfunction of cervical region: Secondary | ICD-10-CM

## 2024-01-29 DIAGNOSIS — M25512 Pain in left shoulder: Secondary | ICD-10-CM

## 2024-01-29 DIAGNOSIS — M62838 Other muscle spasm: Secondary | ICD-10-CM

## 2024-01-29 DIAGNOSIS — M47812 Spondylosis without myelopathy or radiculopathy, cervical region: Secondary | ICD-10-CM | POA: Diagnosis not present

## 2024-01-29 MED ORDER — LIDOCAINE HCL 1 % IJ SOLN
1.0000 mL | INTRAMUSCULAR | Status: AC | PRN
  Administered 2024-01-29: 1 mL

## 2024-01-29 MED ORDER — BUPIVACAINE HCL 0.25 % IJ SOLN
1.0000 mL | INTRAMUSCULAR | Status: AC | PRN
  Administered 2024-01-29: 1 mL

## 2024-01-29 MED ORDER — METHYLPREDNISOLONE ACETATE 40 MG/ML IJ SUSP
40.0000 mg | INTRAMUSCULAR | Status: AC | PRN
  Administered 2024-01-29: 40 mg via INTRAMUSCULAR

## 2024-01-29 NOTE — Progress Notes (Signed)
 Patient says that she has had left-sided neck and left shoulder pain for 2-3 weeks now. She points over the upper trapezius when describing her pain, and says that it is a constant ache. She denies having any pain that goes beyond the shoulder and into the arm, and denies any numbness or tingling in the arm. Patient says that she does have pain when rolling onto that side when laying down, and it does feel tight when she moves the neck through ROM. She has been using heat, OTC medication, and Tizanidine . She has not continued doing home exercises.

## 2024-01-29 NOTE — Progress Notes (Signed)
 "  Lauren Knox - 79 y.o. female MRN [id number]  Date of birth: [birth year]  Office Visit Note: Visit Date: 01/29/2024 PCP: Sheryle Carwin, MD Referred by: Sheryle Carwin, MD  Subjective: Chief Complaint  Patient presents with   Neck - Pain   HPI: Lauren Knox is a pleasant 79 y.o. female who presents today for follow-up of acute on chronic left sided neck, shoulder, trapezius pain/spasming.  Discussed the use of AI scribe software for clinical note transcription with the patient, who gave verbal consent to proceed.  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  She has severe, nagging pain in the left trapezius, scapular region, and sternocleidomastoid, worsened by prolonged static positioning and rightward neck rotation. Having spasming at times. She denies pain radiation to the right, and there was no clear inciting trauma, though she notes frequent purse carrying on the left. Cold temperatures worsen symptoms.  She has had similar flares, most recently about seven months ago. Prior treatments include tizanidine , over-the-counter analgesics, heat, and a home exercise program. She stopped most exercises over the holidays due to pain with stretching but continues to use heat and hot showers with partial relief. Tizanidine , used at night and now during the day, has decreasing benefit. Aspirin helps minimally. She is not taking Celebrex  currently, has remaining tizanidine , and does not need a refill. She has not used NSAIDs consistently but is open to a short course of Advil or Aleve  and asks about acetaminophen  versus NSAIDs for muscle pain.  She denies new symptoms in her ankles or hands and denies pain with neck flexion or extension.    Pertinent ROS were reviewed with the patient and found to be negative unless otherwise specified  above in HPI.   Assessment & Plan: Visit Diagnoses:  1. Trapezius muscle spasm   2. Trigger point of neck   3. Trigger point of right shoulder region   4. 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 Prior trigger point injections provided relief. Muscle tightness and postural imbalance contribute. NSAIDs preferred over acetaminophen  for anti-inflammatory effect, short-term use advised. Tizanidine  reserved for nighttime. Exercise and adjunctive therapies expected to improve pain and function. - Administered trigger point injection (2) to left upper back musculature, see procedure note below. - Advised heat therapy and hot showers for muscle tightness. - Recommended resuming prescribed home exercise program for cervical and upper back stretching and strengthening, starting two days post-injection, daily for one month, then maintenance every two to three weeks. - continue tizanidine  2mg  1-2x daily, use primarily at night for muscle relaxation. - Recommended short-term NSAIDs (ibuprofen or naproxen ) as needed for pain control, with caution to avoid prolonged daily use. - Discussed acetaminophen  as an alternative if NSAIDs are contraindicated or not tolerated. - Instructed to alternate carrying heavy items between sides to reduce unilateral muscle strain. - Encouraged to contact office if symptoms do not improve or worsen. - Confirmed no tizanidine  refill needed at this time, but may request if needed.  Postural kyphosis and thoracic degenerative disc disease with abnormal posture (bilateral shoulder protraction) Postural kyphosis and thoracic degenerative disc disease  contribute to musculoskeletal symptoms and abnormal posture. Ongoing  management required to prevent progression and maintain function. Consistent postural exercises necessary for long-term maintenance and prevention of symptom flare-ups. Improvement expected with adherence to exercise regimen. continue tizanidine  2mg  1-2x daily, use primarily at night for muscle relaxation. - Reinforced importance of daily postural exercises to restore and maintain normal alignment, emphasizing scapular retraction and cervical stretching in all directions. - Advised that consistent exercise is necessary for long-term maintenance, analogous to routine dental hygiene to prevent symptom flare-ups. - Provided anticipatory guidance that improvement in posture and reduction in pain are expected with adherence to the exercise regimen. - Encouraged ongoing self-management and to seek further evaluation if symptoms persist or worsen.  Follow-up: Return if symptoms worsen or fail to improve.   Meds & Orders: No orders of the defined types were placed in this encounter.   Orders Placed This Encounter  Procedures   Trigger Point Inj     Procedures: Trigger Point Inj  Date/Time: 01/29/2024 10:52 AM  Performed by: Burnetta Brunet, DO Authorized by: Burnetta Brunet, DO   Consent Given by:  Patient Site marked: the procedure site was marked   Timeout: prior to procedure the correct patient, procedure, and site was verified   Indications:  Muscle spasm and pain Total # of Trigger Points:  2 Location: neck and shoulder   Needle Size:  25 G Approach:  Dorsal Medications #1:  1 mL lidocaine  1 %; 1 mL bupivacaine  0.25 %; 40 mg methylPREDNISolone  acetate 40 MG/ML Medications #2:  1 mL lidocaine  1 %; 1 mL bupivacaine  0.25 % Patient tolerance:  Patient tolerated the procedure well with no immediate complications Comments: *Procedurally successful trigger point injections into the left trapezius and medial scapular border musculature (2)       Clinical History: No specialty comments available.   She reports that she has never smoked. She has been exposed to tobacco smoke. She has never used smokeless tobacco. No results for input(s): HGBA1C, LABURIC in the last 8760 hours.  Objective:    Physical Exam  Gen: Well-appearing, in no acute distress; non-toxic CV: Well-perfused. Warm.  Resp: Breathing unlabored on room air; no wheezing. Psych: Fluid speech in conversation; appropriate affect; normal thought process  *MSK/Ortho Exam: Physical Exam NECK: Neck range of motion intact with no pain on flexion and extension. Pain present on right lateral rotation. Trapezius and sternocleidomastoid muscle tightness noted. MUSCULOSKELETAL: Left shoulder exhibits forward position with muscle tightness.  - Cervical/Postural: No midline spinous process TTP.  There is good range of motion with flexion and extension of the neck.  Negative Spurling's test.  There is left-sided trapezius hypertonicity and associated trigger points here in the left medial scapular border.  There is bilateral shoulder protraction.  There is exaggerated thoracic kyphosis.  Imaging:  Narrative & Impression  CLINICAL DATA:  Chronic neck and mid back pain with spasms. Bilateral shoulder pain. No acute injury or prior relevant surgery.   EXAM: MRI CERVICAL AND THORACIC SPINE WITHOUT CONTRAST   TECHNIQUE: Multiplanar and multiecho pulse sequences of the cervical spine, to include the craniocervical junction and cervicothoracic junction, and the thoracic spine, were obtained without intravenous contrast.   COMPARISON:  Radiographs of the cervical and thoracic spine 02/26/2022 and 04/12/2022. MRI of the cervical spine 11/19/2017   FINDINGS: MRI CERVICAL SPINE FINDINGS   Alignment: Exaggerated cervical lordosis with stable minimal multilevel anterolisthesis. No focal angulation.   Vertebrae: No acute or suspicious osseous findings. Multilevel facet arthropathy.  Cord: Normal in signal and caliber.    Posterior Fossa, vertebral arteries, paraspinal tissues: Mild chronic small vessel ischemic changes within the brainstem.Bilateral vertebral artery flow voids. No significant paraspinal findings. Grossly stable mild left apical scarring.   Disc levels:   C2-3: The disc appears normal. Asymmetric left facet hypertrophy contributing to minimal left foraminal narrowing appears unchanged. The spinal canal is widely patent.   C3-4: The disc appears normal. Stable left greater than right facet hypertrophy without resulting spinal stenosis or foraminal narrowing.   C4-5: Stable bilateral facet hypertrophy, advanced on the left. Minimal disc bulging and uncinate spurring. No spinal stenosis or significant foraminal narrowing.   C5-6: Stable mild loss of disc height with disc bulging, uncinate spurring and bilateral facet hypertrophy, worse on the left. No spinal stenosis. Mild right-greater-than-left foraminal narrowing appears similar.   C6-7: Stable mild disc bulging, uncinate spurring and bilateral facet hypertrophy. No spinal stenosis or significant foraminal narrowing.   C7-T1: Bilateral facet hypertrophy. No significant spinal stenosis or nerve root encroachment.   MRI THORACIC SPINE FINDINGS   Alignment:  Physiologic.   Vertebrae: No acute or suspicious osseous findings.   Cord: The thoracic cord appears normal in signal and caliber.The conus medullaris extends to the mid L1 level.   Paraspinal and other soft tissues: No significant paraspinal abnormalities.   Disc levels:   The thoracic spinal canal is widely patent. There is mild disc bulging and endplate osteophyte formation throughout the thoracic spine. No significant disc herniation, foraminal narrowing or nerve root encroachment identified. Localizing images of the lumbar spine demonstrate mild multilevel spondylosis without evidence of high-grade spinal stenosis.   IMPRESSION: 1. No acute findings or  explanation for the patient's symptoms in the cervical or thoracic spine. 2. Stable multilevel cervical spondylosis with disc bulging, uncinate spurring and facet hypertrophy as described. No significant spinal stenosis or nerve root encroachment. 3. Mild thoracic spondylosis without significant disc herniation, foraminal narrowing or nerve root encroachment. 4. No acute osseous findings. Normal appearance of the cervicothoracic cord.    Past Medical/Family/Surgical/Social History: Medications & Allergies reviewed per EMR, new medications updated. 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   Muscle weakness (generalized) 09/17/2013   Scoliosis concern 09/17/2013   Stiffness of joint, not elsewhere classified, pelvic region and thigh 09/17/2013   Difficulty walking 09/17/2013   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   Osteopenia    Raynaud's disease    Raynaud's syndrome without gangrene 01/27/2016   Rosacea 01/27/2016   Varicose veins    Vitamin D  deficiency 01/27/2016   Family History  Problem Relation Age of Onset   Cancer Mother    Cancer Father    Heart attack Father    Breast cancer Sister    Cancer Brother    Past Surgical History:  Procedure Laterality Date   ABDOMINAL HYSTERECTOMY     CESAREAN SECTION     CHOLECYSTECTOMY     COLONOSCOPY  2023   COLONOSCOPY WITH PROPOFOL  N/A 09/29/2021   Procedure: COLONOSCOPY WITH  PROPOFOL ;  Surgeon: Rollin Dover, MD;   Location: [facility];  Service: Gastroenterology;  Laterality: N/A;   ENDOVENOUS ABLATION SAPHENOUS VEIN W/ LASER Right 02/07/2022   endovenous laser ablation right greater saphenous vein and stab phlebectomy 10-20 incisions right leg by Medford Blade MD   HEMOSTASIS CLIP PLACEMENT  09/29/2021   Procedure: HEMOSTASIS CLIP PLACEMENT;  Surgeon: Rollin Dover, MD;  Location: [facility];  Service: Gastroenterology;;   INTESTINAL BLOCKAGE     POLYPECTOMY  09/29/2021   Procedure: POLYPECTOMY;  Surgeon: Rollin Dover, MD;  Location: [facility];  Service: Gastroenterology;;   TONSILLECTOMY     Social History   Occupational History   Not on file  Tobacco Use   Smoking status: Never    Passive exposure: Past   Smokeless tobacco: Never  Vaping Use   Vaping status: Never Used  Substance and Sexual Activity   Alcohol use: Yes    Alcohol/week: 4.0 standard drinks of alcohol    Types: 4 Glasses of wine per week    Comment: occ   Drug use: Never   Sexual activity: Not on file   "

## 2024-05-21 ENCOUNTER — Ambulatory Visit: Admitting: Rheumatology
